# Patient Record
Sex: Female | Born: 1935
Health system: Southern US, Community
[De-identification: ages and names within clinical notes are randomized; demographics above are authoritative.]

## PROBLEM LIST (undated history)

## (undated) DIAGNOSIS — F32A Depression, unspecified: Secondary | ICD-10-CM

## (undated) DIAGNOSIS — K219 Gastro-esophageal reflux disease without esophagitis: Secondary | ICD-10-CM

## (undated) DIAGNOSIS — K8020 Calculus of gallbladder without cholecystitis without obstruction: Secondary | ICD-10-CM

## (undated) DIAGNOSIS — I714 Abdominal aortic aneurysm, without rupture, unspecified: Secondary | ICD-10-CM

## (undated) DIAGNOSIS — G8929 Other chronic pain: Secondary | ICD-10-CM

## (undated) DIAGNOSIS — M545 Low back pain, unspecified: Secondary | ICD-10-CM

## (undated) DIAGNOSIS — E538 Deficiency of other specified B group vitamins: Secondary | ICD-10-CM

## (undated) DIAGNOSIS — I700 Atherosclerosis of aorta: Secondary | ICD-10-CM

## (undated) DIAGNOSIS — M810 Age-related osteoporosis without current pathological fracture: Secondary | ICD-10-CM

## (undated) DIAGNOSIS — I10 Essential (primary) hypertension: Secondary | ICD-10-CM

## (undated) DIAGNOSIS — H409 Unspecified glaucoma: Secondary | ICD-10-CM

## (undated) DIAGNOSIS — E039 Hypothyroidism, unspecified: Secondary | ICD-10-CM

## (undated) DIAGNOSIS — F419 Anxiety disorder, unspecified: Secondary | ICD-10-CM

## (undated) DIAGNOSIS — E7800 Pure hypercholesterolemia, unspecified: Secondary | ICD-10-CM

## (undated) DIAGNOSIS — D369 Benign neoplasm, unspecified site: Secondary | ICD-10-CM

## (undated) DIAGNOSIS — M1990 Unspecified osteoarthritis, unspecified site: Secondary | ICD-10-CM

## (undated) DIAGNOSIS — Z8639 Personal history of other endocrine, nutritional and metabolic disease: Secondary | ICD-10-CM

## (undated) DIAGNOSIS — G43909 Migraine, unspecified, not intractable, without status migrainosus: Secondary | ICD-10-CM

## (undated) DIAGNOSIS — K5790 Diverticulosis of intestine, part unspecified, without perforation or abscess without bleeding: Secondary | ICD-10-CM

## (undated) DIAGNOSIS — F329 Major depressive disorder, single episode, unspecified: Secondary | ICD-10-CM

## (undated) DIAGNOSIS — K921 Melena: Secondary | ICD-10-CM

## (undated) HISTORY — DX: Pure hypercholesterolemia, unspecified: E78.00

## (undated) HISTORY — DX: Age-related osteoporosis without current pathological fracture: M81.0

## (undated) HISTORY — DX: Calculus of gallbladder without cholecystitis without obstruction: K80.20

## (undated) HISTORY — DX: Personal history of other endocrine, nutritional and metabolic disease: Z86.39

## (undated) HISTORY — DX: Diverticulosis of intestine, part unspecified, without perforation or abscess without bleeding: K57.90

## (undated) HISTORY — DX: Unspecified osteoarthritis, unspecified site: M19.90

## (undated) HISTORY — PX: JOINT REPLACEMENT: SHX530

## (undated) HISTORY — DX: Deficiency of other specified B group vitamins: E53.8

## (undated) HISTORY — DX: Benign neoplasm, unspecified site: D36.9

## (undated) HISTORY — DX: Low back pain, unspecified: M54.50

## (undated) HISTORY — DX: Gastro-esophageal reflux disease without esophagitis: K21.9

## (undated) HISTORY — DX: Depression, unspecified: F32.A

## (undated) HISTORY — PX: ABDOMINAL HYSTERECTOMY: SHX81

## (undated) HISTORY — PX: BREAST ENHANCEMENT SURGERY: SHX7

## (undated) HISTORY — DX: Migraine, unspecified, not intractable, without status migrainosus: G43.909

## (undated) HISTORY — DX: Abdominal aortic aneurysm, without rupture, unspecified: I71.40

## (undated) HISTORY — PX: HERNIA REPAIR: SHX51

## (undated) HISTORY — DX: Anxiety disorder, unspecified: F41.9

## (undated) HISTORY — DX: Atherosclerosis of aorta: I70.0

## (undated) HISTORY — DX: Major depressive disorder, single episode, unspecified: F32.9

## (undated) HISTORY — DX: Low back pain: M54.5

## (undated) HISTORY — DX: Abdominal aortic aneurysm, without rupture: I71.4

## (undated) HISTORY — PX: CATARACT EXTRACTION, BILATERAL: SHX1313

## (undated) HISTORY — DX: Other chronic pain: G89.29

---

## 1997-11-06 DIAGNOSIS — K5790 Diverticulosis of intestine, part unspecified, without perforation or abscess without bleeding: Secondary | ICD-10-CM

## 1997-11-06 HISTORY — DX: Diverticulosis of intestine, part unspecified, without perforation or abscess without bleeding: K57.90

## 1998-08-18 ENCOUNTER — Emergency Department (HOSPITAL_COMMUNITY): Admission: EM | Admit: 1998-08-18 | Discharge: 1998-08-18 | Payer: Self-pay | Admitting: Emergency Medicine

## 1998-09-09 ENCOUNTER — Inpatient Hospital Stay (HOSPITAL_COMMUNITY): Admission: EM | Admit: 1998-09-09 | Discharge: 1998-09-11 | Payer: Self-pay | Admitting: Emergency Medicine

## 1999-02-26 ENCOUNTER — Emergency Department (HOSPITAL_COMMUNITY): Admission: EM | Admit: 1999-02-26 | Discharge: 1999-02-26 | Payer: Self-pay | Admitting: Emergency Medicine

## 2000-07-30 ENCOUNTER — Other Ambulatory Visit: Admission: RE | Admit: 2000-07-30 | Discharge: 2000-07-30 | Payer: Self-pay | Admitting: Family Medicine

## 2000-08-06 ENCOUNTER — Encounter: Payer: Self-pay | Admitting: Dermatology

## 2000-08-06 ENCOUNTER — Encounter: Admission: RE | Admit: 2000-08-06 | Discharge: 2000-08-06 | Payer: Self-pay | Admitting: Dermatology

## 2000-11-28 ENCOUNTER — Encounter: Payer: Self-pay | Admitting: Family Medicine

## 2000-11-28 ENCOUNTER — Encounter: Admission: RE | Admit: 2000-11-28 | Discharge: 2000-11-28 | Payer: Self-pay | Admitting: Family Medicine

## 2001-02-19 ENCOUNTER — Ambulatory Visit (HOSPITAL_COMMUNITY): Admission: RE | Admit: 2001-02-19 | Discharge: 2001-02-19 | Payer: Self-pay | Admitting: Gastroenterology

## 2001-05-24 ENCOUNTER — Encounter: Payer: Self-pay | Admitting: Emergency Medicine

## 2001-05-25 ENCOUNTER — Inpatient Hospital Stay (HOSPITAL_COMMUNITY): Admission: RE | Admit: 2001-05-25 | Discharge: 2001-05-26 | Payer: Self-pay | Admitting: Interventional Cardiology

## 2001-05-26 ENCOUNTER — Encounter: Payer: Self-pay | Admitting: Interventional Cardiology

## 2001-07-03 ENCOUNTER — Other Ambulatory Visit: Admission: RE | Admit: 2001-07-03 | Discharge: 2001-07-03 | Payer: Self-pay | Admitting: Family Medicine

## 2004-03-30 ENCOUNTER — Ambulatory Visit (HOSPITAL_COMMUNITY): Admission: RE | Admit: 2004-03-30 | Discharge: 2004-03-30 | Payer: Self-pay | Admitting: Family Medicine

## 2004-03-31 ENCOUNTER — Ambulatory Visit (HOSPITAL_COMMUNITY): Admission: RE | Admit: 2004-03-31 | Discharge: 2004-03-31 | Payer: Self-pay | Admitting: Family Medicine

## 2004-07-05 ENCOUNTER — Ambulatory Visit (HOSPITAL_COMMUNITY): Admission: RE | Admit: 2004-07-05 | Discharge: 2004-07-05 | Payer: Self-pay | Admitting: Family Medicine

## 2004-11-04 ENCOUNTER — Other Ambulatory Visit: Admission: RE | Admit: 2004-11-04 | Discharge: 2004-11-04 | Payer: Self-pay | Admitting: Family Medicine

## 2005-03-20 ENCOUNTER — Encounter (HOSPITAL_COMMUNITY): Admission: RE | Admit: 2005-03-20 | Discharge: 2005-06-18 | Payer: Self-pay | Admitting: Endocrinology

## 2005-07-17 ENCOUNTER — Encounter: Admission: RE | Admit: 2005-07-17 | Discharge: 2005-07-17 | Payer: Self-pay | Admitting: Family Medicine

## 2005-07-31 ENCOUNTER — Encounter: Admission: RE | Admit: 2005-07-31 | Discharge: 2005-09-07 | Payer: Self-pay | Admitting: Neurosurgery

## 2005-11-13 ENCOUNTER — Other Ambulatory Visit: Admission: RE | Admit: 2005-11-13 | Discharge: 2005-11-13 | Payer: Self-pay | Admitting: Family Medicine

## 2006-01-03 ENCOUNTER — Encounter: Admission: RE | Admit: 2006-01-03 | Discharge: 2006-01-03 | Payer: Self-pay | Admitting: General Surgery

## 2006-01-08 ENCOUNTER — Ambulatory Visit (HOSPITAL_BASED_OUTPATIENT_CLINIC_OR_DEPARTMENT_OTHER): Admission: RE | Admit: 2006-01-08 | Discharge: 2006-01-08 | Payer: Self-pay | Admitting: General Surgery

## 2006-01-08 ENCOUNTER — Encounter (INDEPENDENT_AMBULATORY_CARE_PROVIDER_SITE_OTHER): Payer: Self-pay | Admitting: *Deleted

## 2006-07-23 ENCOUNTER — Ambulatory Visit (HOSPITAL_COMMUNITY): Admission: RE | Admit: 2006-07-23 | Discharge: 2006-07-23 | Payer: Self-pay | Admitting: Endocrinology

## 2006-07-31 ENCOUNTER — Encounter (HOSPITAL_COMMUNITY): Admission: RE | Admit: 2006-07-31 | Discharge: 2006-10-29 | Payer: Self-pay | Admitting: Endocrinology

## 2006-11-27 ENCOUNTER — Ambulatory Visit (HOSPITAL_COMMUNITY): Admission: RE | Admit: 2006-11-27 | Discharge: 2006-11-27 | Payer: Self-pay | Admitting: Ophthalmology

## 2006-11-27 ENCOUNTER — Encounter: Payer: Self-pay | Admitting: Vascular Surgery

## 2006-12-09 ENCOUNTER — Emergency Department (HOSPITAL_COMMUNITY): Admission: EM | Admit: 2006-12-09 | Discharge: 2006-12-09 | Payer: Self-pay | Admitting: *Deleted

## 2007-05-23 ENCOUNTER — Ambulatory Visit (HOSPITAL_COMMUNITY): Admission: RE | Admit: 2007-05-23 | Discharge: 2007-05-24 | Payer: Self-pay | Admitting: Ophthalmology

## 2008-04-09 ENCOUNTER — Encounter: Admission: RE | Admit: 2008-04-09 | Discharge: 2008-04-09 | Payer: Self-pay | Admitting: Family Medicine

## 2009-01-20 ENCOUNTER — Encounter: Admission: RE | Admit: 2009-01-20 | Discharge: 2009-01-20 | Payer: Self-pay | Admitting: Internal Medicine

## 2009-03-09 ENCOUNTER — Ambulatory Visit (HOSPITAL_COMMUNITY): Admission: RE | Admit: 2009-03-09 | Discharge: 2009-03-09 | Payer: Self-pay | Admitting: Cardiology

## 2010-01-10 ENCOUNTER — Other Ambulatory Visit: Admission: RE | Admit: 2010-01-10 | Discharge: 2010-01-10 | Payer: Self-pay | Admitting: Family Medicine

## 2010-02-23 ENCOUNTER — Inpatient Hospital Stay (HOSPITAL_COMMUNITY): Admission: RE | Admit: 2010-02-23 | Discharge: 2010-02-27 | Payer: Self-pay | Admitting: Orthopedic Surgery

## 2010-11-27 ENCOUNTER — Encounter: Payer: Self-pay | Admitting: Family Medicine

## 2011-01-24 LAB — PROTIME-INR
INR: 1.18 (ref 0.00–1.49)
INR: 1.39 (ref 0.00–1.49)
INR: 1.6 — ABNORMAL HIGH (ref 0.00–1.49)
Prothrombin Time: 16.9 seconds — ABNORMAL HIGH (ref 11.6–15.2)
Prothrombin Time: 18.9 seconds — ABNORMAL HIGH (ref 11.6–15.2)
Prothrombin Time: 19.4 seconds — ABNORMAL HIGH (ref 11.6–15.2)

## 2011-01-24 LAB — TYPE AND SCREEN: Antibody Screen: NEGATIVE

## 2011-01-24 LAB — PREPARE RBC (CROSSMATCH)

## 2011-01-24 LAB — CBC
MCHC: 33.4 g/dL (ref 30.0–36.0)
MCHC: 33.5 g/dL (ref 30.0–36.0)
MCHC: 33.7 g/dL (ref 30.0–36.0)
MCV: 94.3 fL (ref 78.0–100.0)
MCV: 94.5 fL (ref 78.0–100.0)
Platelets: 134 10*3/uL — ABNORMAL LOW (ref 150–400)
RBC: 2.92 MIL/uL — ABNORMAL LOW (ref 3.87–5.11)
RDW: 13.1 % (ref 11.5–15.5)
RDW: 13.7 % (ref 11.5–15.5)
WBC: 7.5 10*3/uL (ref 4.0–10.5)

## 2011-01-24 LAB — BASIC METABOLIC PANEL
BUN: 26 mg/dL — ABNORMAL HIGH (ref 6–23)
CO2: 28 mEq/L (ref 19–32)
CO2: 29 mEq/L (ref 19–32)
Chloride: 102 mEq/L (ref 96–112)
Creatinine, Ser: 0.97 mg/dL (ref 0.4–1.2)
GFR calc Af Amer: 59 mL/min — ABNORMAL LOW (ref >60)
GFR calc non Af Amer: 49 mL/min — ABNORMAL LOW (ref >60)
Glucose, Bld: 124 mg/dL — ABNORMAL HIGH (ref 70–99)
Potassium: 3.9 mEq/L (ref 3.5–5.1)

## 2011-01-25 LAB — CBC
HCT: 37.2 % (ref 36.0–46.0)
MCHC: 34.1 g/dL (ref 30.0–36.0)
MCV: 94 fL (ref 78.0–100.0)
Platelets: 220 10*3/uL (ref 150–400)
RDW: 13.4 % (ref 11.5–15.5)

## 2011-01-25 LAB — URINALYSIS, ROUTINE W REFLEX MICROSCOPIC
Glucose, UA: NEGATIVE mg/dL
Hgb urine dipstick: NEGATIVE
Ketones, ur: NEGATIVE mg/dL
Protein, ur: NEGATIVE mg/dL

## 2011-01-25 LAB — COMPREHENSIVE METABOLIC PANEL
Albumin: 4.1 g/dL (ref 3.5–5.2)
BUN: 23 mg/dL (ref 6–23)
Calcium: 9.5 mg/dL (ref 8.4–10.5)
Chloride: 102 mEq/L (ref 96–112)
Creatinine, Ser: 0.87 mg/dL (ref 0.4–1.2)
Total Bilirubin: 0.7 mg/dL (ref 0.3–1.2)
Total Protein: 7.4 g/dL (ref 6.0–8.3)

## 2011-01-25 LAB — PROTIME-INR: INR: 1.07 (ref 0.00–1.49)

## 2011-01-25 LAB — APTT: aPTT: 33 seconds (ref 24–37)

## 2011-03-21 NOTE — Op Note (Signed)
NAMEGAYNA, BRADDY                 ACCOUNT NO.:  [id number]   MEDICAL RECORD NO.:  [id number]          PATIENT TYPE:  OIB   LOCATION:  5727                         FACILITY:  [facility]   PHYSICIAN:  John D. Ashley Royalty, M.D. DATE OF BIRTH:  [birth year]   DATE OF PROCEDURE:  05/23/2007  DATE OF DISCHARGE:  05/24/2007                               OPERATIVE REPORT   ADMISSION DIAGNOSIS:  Retained lens material, glaucoma, posterior  membranes, chorioretinitis in the right eye.   PROCEDURES:  Pars plana vitrectomy with removal of lens remnants and  nucleus from the vitreous cavity with fragmentation, membrane peel,  retinal photocoagulation, all in the right eye.   SURGEON:  Alan Mulder, MD   ASSISTANT:  Rosalie Doctor, MA   ANESTHESIA:  General.   DETAILS:  Usual prep and drape, 20 gauge sclerotomies at 8, 10 and 2  o'clock.  5 mm infusion port anchored in place at 5 o'clock.  The  lighted pick and the cutter placed at 10 and 2 o'clock respectively.  Pars plana vitrectomy was begun in the pupillary axis where white fluffy  material was encountered.  This was carefully removed under low suction  and rapid cutting.  Yellow nuclear fragments and white fluffy fragments  were seen.  The lens was ensnared in vitreous for 360 degrees.  The  vitrectomy cutter was used to free the vitreous from the lens fragments  and remove small particles and medium sized particle from their  attachments to the vitreous and from their free floating position in the  vitreous.  Once the large nuclear fragments became mobile, the  fragmentome was placed in the eye and the phacofragmentation was  performed by engaging the nucleus, lifting it up to the pupillary axis  and then turning on the fragmentation device.  This was performed  multiple times until all yellow fragments were removed.  The fragmentome  was removed from the eye.  The cutter was repositioned in the eye.  Pars  plana vitrectomy was continued.  The  posterior hyaloid lifted from its  attachments to the macula and the lighted pick was used to engage  posterior membranes which were attached to the disk and to chorioretinal  scars.  The retinal break was seen at 8 o'clock.  This was immediately  surrounded with laser.  Another retinal break was seen at 10 o'clock and  this was surrounded with laser.  Once all vitreous was removed, the  attention was carried to the pars plana area where additional white  material was seen.  This was carefully removed under low suction and  rapid cutting with the vitreous cutter.  The laser was positioned in the  eye.  The endolaser was positioned in the eye.  A total of 1270 burns  were placed around retinal breaks and around the periphery.  The power  was 1000 milliwatts, 1000 microns each and 0.1 seconds each.  Once all  fragments were removed, the instruments removed from the eye and 9-0  nylon was used to close the sclerotomy sites.  The conjunctiva was  closed with wet-field  cautery.  Polymyxin and gentamicin were irrigated  into [place] space.  Marcaine was injected around the globe for postop  pain.  TobraDex ophthalmic  ointment, a patch and shield were placed, Decadron 10 mg was injected to  the lower subconjunctival space.  Closing pressure was 15 with a  Barraquer tonometer.  Complications none.  Duration 1 hour.  The patient  was awakened and taken to recovery in satisfactory condition.      Beulah Gandy. Ashley Royalty, M.D.  Electronically Signed     JDM/MEDQ  D:  05/23/2007  T:  05/24/2007  Job:  [id number]

## 2011-03-24 NOTE — Op Note (Signed)
[name], Margaret                 ACCOUNT NO.:  [id number]   MEDICAL RECORD NO.:  [id number]          PATIENT TYPE:  AMB   LOCATION:  DSC                          FACILITY:  [facility]   PHYSICIAN:  Anselm Pancoast. Weatherly, M.D.DATE OF BIRTH:  05/08/1929   DATE OF PROCEDURE:  01/08/2006  DATE OF DISCHARGE:                                 OPERATIVE REPORT   PREOPERATIVE DIAGNOSIS:  Right inguinal hernia, probably an indirect hernia.   POSTOPERATIVE DIAGNOSIS:   OPERATION PERFORMED:  Right inguinal herniorrhaphy.   SURGEON:  Anselm Pancoast. Zachery Dakins, M.D.   ASSISTANT:  Nurse.   ANESTHESIA:  General.  Local supplementation.   INDICATIONS FOR PROCEDURE:  Margaret Li is a 77 year old female who was seen  by Dr. Lurene Li probably about a year ago with left lower quadrant pain.  At  that time on his exam he could not feel an inguinal hernia.  She had  gallstones that they thought were asymptomatic and there was some question  of whether Dr. Lurene Li had recommended surgery for her gallstones, but that  according to Dr. Danella Penton notes, was never recommended.  Anyway, the patient  now has a bulge in the right groin area that her primary physician referred  her to me thinking possibly this was a femoral hernia.  In examination in  the office approximately two weeks ago, I felt that this was probably not a  femoral hernia, but it was definitely an inguinal hernia with a definite  bulge coming out through the external ring area and scheduled, recommended  that we proceed on with a right inguinal herniorrhaphy.  She has had a past  history of blood pressure control and anxiety and I think, however, we could  do her as an outpatient.  Margaret Li, I think, is her primary care  physician.   DESCRIPTION OF PROCEDURE:  The patient was taken to the operative suite.  The patient then identified.  I had marked the right side and I made after  she was introduced with anesthesia and LOE tube placed, the right  groin was  prepped with Betadine solution and draped in sterile manner.  The inguinal  area had been marked and we made an incision down through the superficial  subcutaneous tissue through Scarpa's fascia and then identified the external  oblique aponeurosis.  There is definitely fatty tissue protruding out  through the external ring that had been stretched.  I could not feel any  bulge in the actual femoral area and I opened the external oblique open and  then first reduced this preperitoneal fatty tissue that had herniated  through the ring.  I dissected the fatty tissue from the ring structure very  carefully.  The inguinal nerve had been identified and there was a lot of  preperitoneal fatty tissue that had herniated down but the hernia sac was  separated from this, opened and then a high sac ligation with 2-0 Surgilon  performed and the hernia sac removed.  A second suture was placed.  The  actual floor area that had been definitely stretched and weakened with  this  hernia was then closed with a running 2-0 Prolene starting at the symphysis  pubis and coming down recreating the internal ring and then going back tying  the two ends together.  The external oblique I actually closed it also with  a 2-0 Prolene so that I can incorporate the internal oblique and the  external oblique into a single layer since there coursed no cord structures.  The external ring that had been stretched because of the wad of stuff  herniating through it was tightened and the inspection from the inside, I  could not actually feel it or see a direct inguinal hernia.  The  subcutaneous tissue was closed with 3-0 Vicryl, 4-0 Dexon subcuticular and  then benzoin and  Steri-Strips on the patient's skin.  I had used a nerve block of 0.25%  Marcaine with Adrenalin at the ilioinguinal nerve area and also the floor  area which hopefully will prevent immediate postoperative pain.  The patient  will be released after a  short stay and see him back in the office in  approximately 10 days.           ______________________________  Anselm Pancoast. Zachery Dakins, M.D.     WJW/MEDQ  D:  01/08/2006  T:  01/09/2006  Job:  [id number]   cc:   Duncan Dull, M.D.  Fax: [phone]

## 2011-03-24 NOTE — Consult Note (Signed)
[facility]  Patient:    Margaret Li, Margaret Li                        MRN: [id number] Proc. Date: 05/24/01 Adm. Date:  [id number] Disc. Date: [id number] Attending:  Daine Gip CC:         Duncan Dull, M.D.   Consultation Report  PRIMARY PHYSICIAN: Duncan Dull, M.D.  REASON FOR CONSULTATION: Chest discomfort.  HISTORY OF PRESENT ILLNESS: This patient is 77 years of age and came to the emergency room because of dizziness and chest discomfort.  She states dizziness has been going on for several months now.  She has had scans done of her brain by Dr. Kevan Ny but no identity/cause for the dizziness could be found. Over the past two weeks she has been experiencing chest pressure.  She poorly characterizes this discomfort.  She did state, though, that even after arriving here this evening she was having pressure on her chest that was eventually relieved when IV nitroglycerin was started.  The discomfort is not exertion related.  There is no associated dyspnea or radiation.  The dizziness and the chest discomfort also do not seem to be related.  ALLERGIES:  1. CODEINE.  2. SULFA.  PAST MEDICAL HISTORY:  1. History of lower GI bleed secondary to diverticulosis.  2. Hypertension.  3. Glaucoma.  4. Depression.  5. Tonsillectomy.  6. Total abdominal hysterectomy and bilateral salpingo-oophorectomy.  7. Left inguinal herniorrhaphy.  SOCIAL HISTORY: Smokes less than one pack per day.  Negative ethanol consumption.  She is a retired Engineer, site.  She is divorced.  FAMILY HISTORY: Mother died at 81.  The patient believes she died of a cardiac cause.  REVIEW OF SYSTEMS: Dr. Jennelle Human is her psychiatrist.  PHYSICAL EXAMINATION:  VITAL SIGNS: Blood pressure 148/70, heart rate 70, respirations 16 and unlabored.  SKIN: Clear.  Nail beds reveal no cyanosis.  No clubbing noted.  HEENT: PERRL.  EOMI.  NECK: No JVD or carotid bruits.  LUNGS:  Clear to auscultation and percussion.  CARDIAC: No clicks, no rubs or murmurs.  An S4 gallop is audible.  ABDOMEN: Soft.  Liver and spleen not palpable.  Bowel sounds normal.  EXTREMITIES: No edema.  Pulses 2+ and symmetric in upper and lower extremities.  NEUROLOGIC: The patient is alert and oriented x 3.  No motor or sensory deficits noted.  LABORATORY DATA: EKG reveals nonspecific T wave abnormality, normal sinus rhythm.  Chest x-ray reveals no acute disease.  Laboratory data pending.  Potassium 3.7.  CK-MB and troponin I pending.  ASSESSMENT:  1. Chest pressure, qualitatively consistent with ischemic pain.  Rule out     myocardial infarction.  2. History of depression, taken care of by Dr. Jennelle Human.  3. Dizziness, chronic, etiology unidentified at this point.  4. Hypertension.  PLAN:  1. Admit to rule out myocardial infarction.  2. IV nitroglycerin.  3. IV heparin.  4. Consider adenosine Cardiolite if she rules out. DD:  05/24/01 TD:  05/26/01 Job: [id number]

## 2011-03-24 NOTE — Discharge Summary (Signed)
[facility]  Patient:    Margaret Li, Margaret Li                        MRN: [id number] Adm. Date:  [id number] Disc. Date: [id number] Attending:  Lyn Records. Iii CC:         Duncan Dull, M.D.   Discharge Summary  ADMITTING DIAGNOSIS:  Chest discomfort with features consistent with angina pectoris.  DISCHARGE DIAGNOSES: 1. Chest pain, myocardial infarction ruled out and no cardiac source    identified. 2. Hypertension, controlled.  TESTS PERFORMED:  Adenosine Cardiolite, [facility].  DISCHARGE INSTRUCTIONS: 1. Continue usual home medications including nortriptyline 15 mg p.o. q.h.s. 2. Prozac 20 mg p.o. q.h.s. 3. Lorazepam 0.5 mg at bedtime. 4. Avapro 150 mg a day. 5. Estraderm patch 0.1 mg twice weekly. 6. HCTZ one-half tablet per day. 7. Multivitamins one per day.  ACTIVITY:  No restrictions.  DIET:  Low salt.  CONDITION ON DISCHARGE:  Improved.  HOSPITAL COURSE:  The patient is a very pleasant 77 year old African-American female who was admitted through the [facility] emergency room on May 24, 2001.  She was admitted with a somewhat vague history of chest pressure. Initial EKG revealed nonspecific ST-T abnormality and initial cardiac markers for a myocardial injury were negative.  Because of the history, the patients age, and the quality of the discomfort, she was to be admitted to [facility] to rule out myocardial infarction.  Unfortunately, beds were not present at [facility] and the patient was transported to [facility] in the early morning hours of May 25, 2001.  This actually occurred to the patients displeasure and without my knowledge.  The patient remained stable while hospitalized at [facility].  The patients chest discomfort was relieved with intravenous nitroglycerin and was eventually weaned and discontinued.  Serial enzymes did not reveal evidence of myocardial  infarction.  On May 26, 2001, she was transported back to [facility] by ambulance and adenosine Cardiolite was performed.  This study was reviewed this afternoon with Dr. Loralie Champagne and we felt that the study was entirely normal.  Patient is discharged at this time.  A Cardiolite study and serial enzymes have not demonstrated any evidence of ischemic heart disease.  She has a follow-up with her primary doctor, Dr. Shaune Pollack, in the coming week or two. She is already scheduled to have a complete examination. DD:  05/26/01 TD:  05/27/01 Job: [id number]

## 2011-08-21 LAB — CBC
HCT: 35.4 — ABNORMAL LOW
Platelets: 244
WBC: 6.3

## 2011-08-21 LAB — BASIC METABOLIC PANEL
BUN: 26 — ABNORMAL HIGH
GFR calc non Af Amer: 54 — ABNORMAL LOW
Potassium: 4.1

## 2011-11-30 DIAGNOSIS — H401490 Capsular glaucoma with pseudoexfoliation of lens, unspecified eye, stage unspecified: Secondary | ICD-10-CM | POA: Diagnosis not present

## 2011-11-30 DIAGNOSIS — H409 Unspecified glaucoma: Secondary | ICD-10-CM | POA: Diagnosis not present

## 2011-11-30 DIAGNOSIS — H04129 Dry eye syndrome of unspecified lacrimal gland: Secondary | ICD-10-CM | POA: Diagnosis not present

## 2011-11-30 DIAGNOSIS — H1045 Other chronic allergic conjunctivitis: Secondary | ICD-10-CM | POA: Diagnosis not present

## 2012-01-04 DIAGNOSIS — F329 Major depressive disorder, single episode, unspecified: Secondary | ICD-10-CM | POA: Diagnosis not present

## 2012-01-04 DIAGNOSIS — F3289 Other specified depressive episodes: Secondary | ICD-10-CM | POA: Diagnosis not present

## 2012-02-08 DIAGNOSIS — E039 Hypothyroidism, unspecified: Secondary | ICD-10-CM | POA: Diagnosis not present

## 2012-02-08 DIAGNOSIS — F329 Major depressive disorder, single episode, unspecified: Secondary | ICD-10-CM | POA: Diagnosis not present

## 2012-02-08 DIAGNOSIS — F3289 Other specified depressive episodes: Secondary | ICD-10-CM | POA: Diagnosis not present

## 2012-02-08 DIAGNOSIS — E7800 Pure hypercholesterolemia, unspecified: Secondary | ICD-10-CM | POA: Diagnosis not present

## 2012-02-08 DIAGNOSIS — Z79899 Other long term (current) drug therapy: Secondary | ICD-10-CM | POA: Diagnosis not present

## 2012-02-08 DIAGNOSIS — I10 Essential (primary) hypertension: Secondary | ICD-10-CM | POA: Diagnosis not present

## 2012-03-06 DIAGNOSIS — H01009 Unspecified blepharitis unspecified eye, unspecified eyelid: Secondary | ICD-10-CM | POA: Diagnosis not present

## 2012-03-06 DIAGNOSIS — H04129 Dry eye syndrome of unspecified lacrimal gland: Secondary | ICD-10-CM | POA: Diagnosis not present

## 2012-03-06 DIAGNOSIS — H401490 Capsular glaucoma with pseudoexfoliation of lens, unspecified eye, stage unspecified: Secondary | ICD-10-CM | POA: Diagnosis not present

## 2012-03-06 DIAGNOSIS — H409 Unspecified glaucoma: Secondary | ICD-10-CM | POA: Diagnosis not present

## 2012-03-11 DIAGNOSIS — J40 Bronchitis, not specified as acute or chronic: Secondary | ICD-10-CM | POA: Diagnosis not present

## 2012-03-11 DIAGNOSIS — R0989 Other specified symptoms and signs involving the circulatory and respiratory systems: Secondary | ICD-10-CM | POA: Diagnosis not present

## 2012-03-11 DIAGNOSIS — R062 Wheezing: Secondary | ICD-10-CM | POA: Diagnosis not present

## 2012-04-10 DIAGNOSIS — I951 Orthostatic hypotension: Secondary | ICD-10-CM | POA: Diagnosis not present

## 2012-04-17 DIAGNOSIS — H35379 Puckering of macula, unspecified eye: Secondary | ICD-10-CM | POA: Diagnosis not present

## 2012-04-17 DIAGNOSIS — H4011X0 Primary open-angle glaucoma, stage unspecified: Secondary | ICD-10-CM | POA: Diagnosis not present

## 2012-04-17 DIAGNOSIS — Z961 Presence of intraocular lens: Secondary | ICD-10-CM | POA: Diagnosis not present

## 2012-04-17 DIAGNOSIS — H26499 Other secondary cataract, unspecified eye: Secondary | ICD-10-CM | POA: Diagnosis not present

## 2012-04-17 DIAGNOSIS — H40159 Residual stage of open-angle glaucoma, unspecified eye: Secondary | ICD-10-CM | POA: Diagnosis not present

## 2012-09-11 DIAGNOSIS — Z0000 Encounter for general adult medical examination without abnormal findings: Secondary | ICD-10-CM | POA: Diagnosis not present

## 2012-09-11 DIAGNOSIS — F329 Major depressive disorder, single episode, unspecified: Secondary | ICD-10-CM | POA: Diagnosis not present

## 2012-09-11 DIAGNOSIS — H409 Unspecified glaucoma: Secondary | ICD-10-CM | POA: Diagnosis not present

## 2012-09-11 DIAGNOSIS — M810 Age-related osteoporosis without current pathological fracture: Secondary | ICD-10-CM | POA: Diagnosis not present

## 2012-09-11 DIAGNOSIS — I10 Essential (primary) hypertension: Secondary | ICD-10-CM | POA: Diagnosis not present

## 2012-09-11 DIAGNOSIS — E7800 Pure hypercholesterolemia, unspecified: Secondary | ICD-10-CM | POA: Diagnosis not present

## 2012-09-11 DIAGNOSIS — Z79899 Other long term (current) drug therapy: Secondary | ICD-10-CM | POA: Diagnosis not present

## 2012-09-11 DIAGNOSIS — E039 Hypothyroidism, unspecified: Secondary | ICD-10-CM | POA: Diagnosis not present

## 2012-09-11 DIAGNOSIS — Z23 Encounter for immunization: Secondary | ICD-10-CM | POA: Diagnosis not present

## 2012-09-23 DIAGNOSIS — M810 Age-related osteoporosis without current pathological fracture: Secondary | ICD-10-CM | POA: Diagnosis not present

## 2012-11-20 DIAGNOSIS — Z1231 Encounter for screening mammogram for malignant neoplasm of breast: Secondary | ICD-10-CM | POA: Diagnosis not present

## 2012-12-09 DIAGNOSIS — H01009 Unspecified blepharitis unspecified eye, unspecified eyelid: Secondary | ICD-10-CM | POA: Diagnosis not present

## 2012-12-09 DIAGNOSIS — H26499 Other secondary cataract, unspecified eye: Secondary | ICD-10-CM | POA: Diagnosis not present

## 2012-12-09 DIAGNOSIS — H40159 Residual stage of open-angle glaucoma, unspecified eye: Secondary | ICD-10-CM | POA: Diagnosis not present

## 2012-12-09 DIAGNOSIS — H401490 Capsular glaucoma with pseudoexfoliation of lens, unspecified eye, stage unspecified: Secondary | ICD-10-CM | POA: Diagnosis not present

## 2012-12-12 DIAGNOSIS — I10 Essential (primary) hypertension: Secondary | ICD-10-CM | POA: Diagnosis not present

## 2012-12-13 DIAGNOSIS — M169 Osteoarthritis of hip, unspecified: Secondary | ICD-10-CM | POA: Diagnosis not present

## 2012-12-13 DIAGNOSIS — R079 Chest pain, unspecified: Secondary | ICD-10-CM | POA: Diagnosis not present

## 2012-12-13 DIAGNOSIS — M1610 Unilateral primary osteoarthritis, unspecified hip: Secondary | ICD-10-CM | POA: Diagnosis not present

## 2013-01-09 DIAGNOSIS — I10 Essential (primary) hypertension: Secondary | ICD-10-CM | POA: Diagnosis not present

## 2013-01-09 DIAGNOSIS — F329 Major depressive disorder, single episode, unspecified: Secondary | ICD-10-CM | POA: Diagnosis not present

## 2013-01-09 DIAGNOSIS — R5381 Other malaise: Secondary | ICD-10-CM | POA: Diagnosis not present

## 2013-02-06 DIAGNOSIS — I10 Essential (primary) hypertension: Secondary | ICD-10-CM | POA: Diagnosis not present

## 2013-02-07 DIAGNOSIS — H409 Unspecified glaucoma: Secondary | ICD-10-CM | POA: Diagnosis not present

## 2013-02-07 DIAGNOSIS — H401490 Capsular glaucoma with pseudoexfoliation of lens, unspecified eye, stage unspecified: Secondary | ICD-10-CM | POA: Diagnosis not present

## 2013-02-07 DIAGNOSIS — H04129 Dry eye syndrome of unspecified lacrimal gland: Secondary | ICD-10-CM | POA: Diagnosis not present

## 2013-04-09 DIAGNOSIS — H401490 Capsular glaucoma with pseudoexfoliation of lens, unspecified eye, stage unspecified: Secondary | ICD-10-CM | POA: Diagnosis not present

## 2013-04-09 DIAGNOSIS — H01009 Unspecified blepharitis unspecified eye, unspecified eyelid: Secondary | ICD-10-CM | POA: Diagnosis not present

## 2013-04-09 DIAGNOSIS — H04129 Dry eye syndrome of unspecified lacrimal gland: Secondary | ICD-10-CM | POA: Diagnosis not present

## 2013-04-09 DIAGNOSIS — H409 Unspecified glaucoma: Secondary | ICD-10-CM | POA: Diagnosis not present

## 2013-06-11 DIAGNOSIS — F3289 Other specified depressive episodes: Secondary | ICD-10-CM | POA: Diagnosis not present

## 2013-06-11 DIAGNOSIS — R5383 Other fatigue: Secondary | ICD-10-CM | POA: Diagnosis not present

## 2013-06-11 DIAGNOSIS — Z1331 Encounter for screening for depression: Secondary | ICD-10-CM | POA: Diagnosis not present

## 2013-06-11 DIAGNOSIS — F329 Major depressive disorder, single episode, unspecified: Secondary | ICD-10-CM | POA: Diagnosis not present

## 2013-06-11 DIAGNOSIS — R5381 Other malaise: Secondary | ICD-10-CM | POA: Diagnosis not present

## 2013-06-11 DIAGNOSIS — E039 Hypothyroidism, unspecified: Secondary | ICD-10-CM | POA: Diagnosis not present

## 2013-06-11 DIAGNOSIS — I10 Essential (primary) hypertension: Secondary | ICD-10-CM | POA: Diagnosis not present

## 2013-06-13 DIAGNOSIS — H26499 Other secondary cataract, unspecified eye: Secondary | ICD-10-CM | POA: Diagnosis not present

## 2013-06-13 DIAGNOSIS — H35379 Puckering of macula, unspecified eye: Secondary | ICD-10-CM | POA: Diagnosis not present

## 2013-06-13 DIAGNOSIS — H409 Unspecified glaucoma: Secondary | ICD-10-CM | POA: Diagnosis not present

## 2013-06-13 DIAGNOSIS — H35039 Hypertensive retinopathy, unspecified eye: Secondary | ICD-10-CM | POA: Diagnosis not present

## 2013-06-13 DIAGNOSIS — H401490 Capsular glaucoma with pseudoexfoliation of lens, unspecified eye, stage unspecified: Secondary | ICD-10-CM | POA: Diagnosis not present

## 2013-06-13 DIAGNOSIS — Z961 Presence of intraocular lens: Secondary | ICD-10-CM | POA: Diagnosis not present

## 2013-06-13 DIAGNOSIS — H04129 Dry eye syndrome of unspecified lacrimal gland: Secondary | ICD-10-CM | POA: Diagnosis not present

## 2013-06-13 DIAGNOSIS — H01009 Unspecified blepharitis unspecified eye, unspecified eyelid: Secondary | ICD-10-CM | POA: Diagnosis not present

## 2013-07-09 DIAGNOSIS — E039 Hypothyroidism, unspecified: Secondary | ICD-10-CM | POA: Diagnosis not present

## 2013-07-09 DIAGNOSIS — L989 Disorder of the skin and subcutaneous tissue, unspecified: Secondary | ICD-10-CM | POA: Diagnosis not present

## 2013-07-09 DIAGNOSIS — D649 Anemia, unspecified: Secondary | ICD-10-CM | POA: Diagnosis not present

## 2013-07-09 DIAGNOSIS — E7800 Pure hypercholesterolemia, unspecified: Secondary | ICD-10-CM | POA: Diagnosis not present

## 2013-07-09 DIAGNOSIS — F329 Major depressive disorder, single episode, unspecified: Secondary | ICD-10-CM | POA: Diagnosis not present

## 2013-08-01 DIAGNOSIS — Z23 Encounter for immunization: Secondary | ICD-10-CM | POA: Diagnosis not present

## 2013-08-07 DIAGNOSIS — H02409 Unspecified ptosis of unspecified eyelid: Secondary | ICD-10-CM | POA: Diagnosis not present

## 2013-08-07 DIAGNOSIS — H401490 Capsular glaucoma with pseudoexfoliation of lens, unspecified eye, stage unspecified: Secondary | ICD-10-CM | POA: Diagnosis not present

## 2013-08-07 DIAGNOSIS — H409 Unspecified glaucoma: Secondary | ICD-10-CM | POA: Diagnosis not present

## 2013-08-07 DIAGNOSIS — H01009 Unspecified blepharitis unspecified eye, unspecified eyelid: Secondary | ICD-10-CM | POA: Diagnosis not present

## 2013-09-15 DIAGNOSIS — H409 Unspecified glaucoma: Secondary | ICD-10-CM | POA: Diagnosis not present

## 2013-09-15 DIAGNOSIS — H04129 Dry eye syndrome of unspecified lacrimal gland: Secondary | ICD-10-CM | POA: Diagnosis not present

## 2013-09-15 DIAGNOSIS — H01009 Unspecified blepharitis unspecified eye, unspecified eyelid: Secondary | ICD-10-CM | POA: Diagnosis not present

## 2013-09-15 DIAGNOSIS — H401490 Capsular glaucoma with pseudoexfoliation of lens, unspecified eye, stage unspecified: Secondary | ICD-10-CM | POA: Diagnosis not present

## 2013-09-18 DIAGNOSIS — F411 Generalized anxiety disorder: Secondary | ICD-10-CM | POA: Diagnosis not present

## 2013-09-18 DIAGNOSIS — I10 Essential (primary) hypertension: Secondary | ICD-10-CM | POA: Diagnosis not present

## 2013-09-18 DIAGNOSIS — N63 Unspecified lump in unspecified breast: Secondary | ICD-10-CM | POA: Diagnosis not present

## 2013-09-22 DIAGNOSIS — N63 Unspecified lump in unspecified breast: Secondary | ICD-10-CM | POA: Diagnosis not present

## 2013-10-08 DIAGNOSIS — I10 Essential (primary) hypertension: Secondary | ICD-10-CM | POA: Diagnosis not present

## 2013-10-08 DIAGNOSIS — E039 Hypothyroidism, unspecified: Secondary | ICD-10-CM | POA: Diagnosis not present

## 2013-10-08 DIAGNOSIS — E7800 Pure hypercholesterolemia, unspecified: Secondary | ICD-10-CM | POA: Diagnosis not present

## 2013-10-13 DIAGNOSIS — H401490 Capsular glaucoma with pseudoexfoliation of lens, unspecified eye, stage unspecified: Secondary | ICD-10-CM | POA: Diagnosis not present

## 2013-10-13 DIAGNOSIS — H409 Unspecified glaucoma: Secondary | ICD-10-CM | POA: Diagnosis not present

## 2013-12-01 DIAGNOSIS — H04129 Dry eye syndrome of unspecified lacrimal gland: Secondary | ICD-10-CM | POA: Diagnosis not present

## 2013-12-01 DIAGNOSIS — H401490 Capsular glaucoma with pseudoexfoliation of lens, unspecified eye, stage unspecified: Secondary | ICD-10-CM | POA: Diagnosis not present

## 2013-12-01 DIAGNOSIS — H409 Unspecified glaucoma: Secondary | ICD-10-CM | POA: Diagnosis not present

## 2014-01-07 DIAGNOSIS — I10 Essential (primary) hypertension: Secondary | ICD-10-CM | POA: Diagnosis not present

## 2014-02-12 DIAGNOSIS — I10 Essential (primary) hypertension: Secondary | ICD-10-CM | POA: Diagnosis not present

## 2014-02-13 DIAGNOSIS — H409 Unspecified glaucoma: Secondary | ICD-10-CM | POA: Diagnosis not present

## 2014-02-13 DIAGNOSIS — H40059 Ocular hypertension, unspecified eye: Secondary | ICD-10-CM | POA: Diagnosis not present

## 2014-02-13 DIAGNOSIS — H401490 Capsular glaucoma with pseudoexfoliation of lens, unspecified eye, stage unspecified: Secondary | ICD-10-CM | POA: Diagnosis not present

## 2014-03-13 DIAGNOSIS — Z961 Presence of intraocular lens: Secondary | ICD-10-CM | POA: Diagnosis not present

## 2014-03-13 DIAGNOSIS — H4011X0 Primary open-angle glaucoma, stage unspecified: Secondary | ICD-10-CM | POA: Diagnosis not present

## 2014-03-13 DIAGNOSIS — H401490 Capsular glaucoma with pseudoexfoliation of lens, unspecified eye, stage unspecified: Secondary | ICD-10-CM | POA: Diagnosis not present

## 2014-04-13 DIAGNOSIS — I10 Essential (primary) hypertension: Secondary | ICD-10-CM | POA: Diagnosis not present

## 2014-04-13 DIAGNOSIS — G4700 Insomnia, unspecified: Secondary | ICD-10-CM | POA: Diagnosis not present

## 2014-04-13 DIAGNOSIS — E039 Hypothyroidism, unspecified: Secondary | ICD-10-CM | POA: Diagnosis not present

## 2014-04-17 DIAGNOSIS — N750 Cyst of Bartholin's gland: Secondary | ICD-10-CM | POA: Diagnosis not present

## 2014-07-15 DIAGNOSIS — E039 Hypothyroidism, unspecified: Secondary | ICD-10-CM | POA: Diagnosis not present

## 2014-07-15 DIAGNOSIS — I10 Essential (primary) hypertension: Secondary | ICD-10-CM | POA: Diagnosis not present

## 2014-07-15 DIAGNOSIS — F329 Major depressive disorder, single episode, unspecified: Secondary | ICD-10-CM | POA: Diagnosis not present

## 2014-08-10 DIAGNOSIS — H401430 Capsular glaucoma with pseudoexfoliation of lens, bilateral, stage unspecified: Secondary | ICD-10-CM | POA: Diagnosis not present

## 2014-08-10 DIAGNOSIS — H4011X3 Primary open-angle glaucoma, severe stage: Secondary | ICD-10-CM | POA: Diagnosis not present

## 2014-08-10 DIAGNOSIS — H409 Unspecified glaucoma: Secondary | ICD-10-CM | POA: Diagnosis not present

## 2014-09-09 DIAGNOSIS — Z23 Encounter for immunization: Secondary | ICD-10-CM | POA: Diagnosis not present

## 2014-09-09 DIAGNOSIS — L989 Disorder of the skin and subcutaneous tissue, unspecified: Secondary | ICD-10-CM | POA: Diagnosis not present

## 2014-09-09 DIAGNOSIS — M810 Age-related osteoporosis without current pathological fracture: Secondary | ICD-10-CM | POA: Diagnosis not present

## 2014-09-09 DIAGNOSIS — I10 Essential (primary) hypertension: Secondary | ICD-10-CM | POA: Diagnosis not present

## 2014-09-09 DIAGNOSIS — F329 Major depressive disorder, single episode, unspecified: Secondary | ICD-10-CM | POA: Diagnosis not present

## 2014-09-09 DIAGNOSIS — F411 Generalized anxiety disorder: Secondary | ICD-10-CM | POA: Diagnosis not present

## 2014-09-24 DIAGNOSIS — Z1231 Encounter for screening mammogram for malignant neoplasm of breast: Secondary | ICD-10-CM | POA: Diagnosis not present

## 2014-09-24 DIAGNOSIS — Z803 Family history of malignant neoplasm of breast: Secondary | ICD-10-CM | POA: Diagnosis not present

## 2014-09-24 DIAGNOSIS — M810 Age-related osteoporosis without current pathological fracture: Secondary | ICD-10-CM | POA: Diagnosis not present

## 2014-10-12 DIAGNOSIS — D485 Neoplasm of uncertain behavior of skin: Secondary | ICD-10-CM | POA: Diagnosis not present

## 2014-11-11 DIAGNOSIS — H4011X3 Primary open-angle glaucoma, severe stage: Secondary | ICD-10-CM | POA: Diagnosis not present

## 2014-12-14 DIAGNOSIS — I10 Essential (primary) hypertension: Secondary | ICD-10-CM | POA: Diagnosis not present

## 2014-12-14 DIAGNOSIS — E780 Pure hypercholesterolemia: Secondary | ICD-10-CM | POA: Diagnosis not present

## 2014-12-14 DIAGNOSIS — F329 Major depressive disorder, single episode, unspecified: Secondary | ICD-10-CM | POA: Diagnosis not present

## 2014-12-14 DIAGNOSIS — E039 Hypothyroidism, unspecified: Secondary | ICD-10-CM | POA: Diagnosis not present

## 2014-12-14 DIAGNOSIS — Z23 Encounter for immunization: Secondary | ICD-10-CM | POA: Diagnosis not present

## 2015-01-11 DIAGNOSIS — N939 Abnormal uterine and vaginal bleeding, unspecified: Secondary | ICD-10-CM | POA: Diagnosis not present

## 2015-01-12 DIAGNOSIS — N939 Abnormal uterine and vaginal bleeding, unspecified: Secondary | ICD-10-CM | POA: Diagnosis not present

## 2015-01-12 DIAGNOSIS — R350 Frequency of micturition: Secondary | ICD-10-CM | POA: Diagnosis not present

## 2015-01-12 DIAGNOSIS — N952 Postmenopausal atrophic vaginitis: Secondary | ICD-10-CM | POA: Diagnosis not present

## 2015-02-11 DIAGNOSIS — H35372 Puckering of macula, left eye: Secondary | ICD-10-CM | POA: Diagnosis not present

## 2015-02-11 DIAGNOSIS — H35033 Hypertensive retinopathy, bilateral: Secondary | ICD-10-CM | POA: Diagnosis not present

## 2015-02-11 DIAGNOSIS — H4011X3 Primary open-angle glaucoma, severe stage: Secondary | ICD-10-CM | POA: Diagnosis not present

## 2015-02-16 DIAGNOSIS — N907 Vulvar cyst: Secondary | ICD-10-CM | POA: Diagnosis not present

## 2015-02-25 DIAGNOSIS — N907 Vulvar cyst: Secondary | ICD-10-CM | POA: Diagnosis not present

## 2015-03-04 DIAGNOSIS — H4011X3 Primary open-angle glaucoma, severe stage: Secondary | ICD-10-CM | POA: Diagnosis not present

## 2015-03-05 DIAGNOSIS — H5711 Ocular pain, right eye: Secondary | ICD-10-CM | POA: Diagnosis not present

## 2015-03-15 DIAGNOSIS — H4011X3 Primary open-angle glaucoma, severe stage: Secondary | ICD-10-CM | POA: Diagnosis not present

## 2015-05-12 DIAGNOSIS — H401430 Capsular glaucoma with pseudoexfoliation of lens, bilateral, stage unspecified: Secondary | ICD-10-CM | POA: Diagnosis not present

## 2015-05-12 DIAGNOSIS — H4011X3 Primary open-angle glaucoma, severe stage: Secondary | ICD-10-CM | POA: Diagnosis not present

## 2015-06-07 DIAGNOSIS — F332 Major depressive disorder, recurrent severe without psychotic features: Secondary | ICD-10-CM | POA: Diagnosis not present

## 2015-06-07 DIAGNOSIS — I10 Essential (primary) hypertension: Secondary | ICD-10-CM | POA: Diagnosis not present

## 2015-06-07 DIAGNOSIS — E039 Hypothyroidism, unspecified: Secondary | ICD-10-CM | POA: Diagnosis not present

## 2015-06-13 ENCOUNTER — Inpatient Hospital Stay (HOSPITAL_COMMUNITY)
Admission: EM | Admit: 2015-06-13 | Discharge: 2015-06-18 | DRG: 378 | Disposition: A | Discharge status: Home Health | Payer: Medicare Other | Attending: Family Medicine | Admitting: Family Medicine

## 2015-06-13 ENCOUNTER — Encounter (HOSPITAL_COMMUNITY): Payer: Self-pay

## 2015-06-13 DIAGNOSIS — I10 Essential (primary) hypertension: Secondary | ICD-10-CM | POA: Diagnosis present

## 2015-06-13 DIAGNOSIS — E876 Hypokalemia: Secondary | ICD-10-CM | POA: Diagnosis not present

## 2015-06-13 DIAGNOSIS — Z87891 Personal history of nicotine dependence: Secondary | ICD-10-CM

## 2015-06-13 DIAGNOSIS — Z7982 Long term (current) use of aspirin: Secondary | ICD-10-CM | POA: Diagnosis not present

## 2015-06-13 DIAGNOSIS — Z9660 Presence of unspecified orthopedic joint implant: Secondary | ICD-10-CM | POA: Diagnosis present

## 2015-06-13 DIAGNOSIS — R10813 Right lower quadrant abdominal tenderness: Secondary | ICD-10-CM | POA: Diagnosis not present

## 2015-06-13 DIAGNOSIS — N939 Abnormal uterine and vaginal bleeding, unspecified: Secondary | ICD-10-CM | POA: Diagnosis not present

## 2015-06-13 DIAGNOSIS — R030 Elevated blood-pressure reading, without diagnosis of hypertension: Secondary | ICD-10-CM | POA: Diagnosis not present

## 2015-06-13 DIAGNOSIS — E785 Hyperlipidemia, unspecified: Secondary | ICD-10-CM | POA: Diagnosis present

## 2015-06-13 DIAGNOSIS — D62 Acute posthemorrhagic anemia: Secondary | ICD-10-CM | POA: Diagnosis present

## 2015-06-13 DIAGNOSIS — Z79899 Other long term (current) drug therapy: Secondary | ICD-10-CM | POA: Diagnosis not present

## 2015-06-13 DIAGNOSIS — E039 Hypothyroidism, unspecified: Secondary | ICD-10-CM | POA: Diagnosis present

## 2015-06-13 DIAGNOSIS — Z90710 Acquired absence of both cervix and uterus: Secondary | ICD-10-CM

## 2015-06-13 DIAGNOSIS — K922 Gastrointestinal hemorrhage, unspecified: Secondary | ICD-10-CM | POA: Diagnosis not present

## 2015-06-13 DIAGNOSIS — Y92002 Bathroom of unspecified non-institutional (private) residence single-family (private) house as the place of occurrence of the external cause: Secondary | ICD-10-CM

## 2015-06-13 DIAGNOSIS — K5731 Diverticulosis of large intestine without perforation or abscess with bleeding: Secondary | ICD-10-CM | POA: Diagnosis not present

## 2015-06-13 DIAGNOSIS — K625 Hemorrhage of anus and rectum: Secondary | ICD-10-CM | POA: Diagnosis not present

## 2015-06-13 DIAGNOSIS — H409 Unspecified glaucoma: Secondary | ICD-10-CM | POA: Diagnosis not present

## 2015-06-13 DIAGNOSIS — Z882 Allergy status to sulfonamides status: Secondary | ICD-10-CM | POA: Diagnosis not present

## 2015-06-13 DIAGNOSIS — W19XXXA Unspecified fall, initial encounter: Secondary | ICD-10-CM

## 2015-06-13 DIAGNOSIS — S3993XA Unspecified injury of pelvis, initial encounter: Secondary | ICD-10-CM | POA: Diagnosis not present

## 2015-06-13 DIAGNOSIS — W1809XA Striking against other object with subsequent fall, initial encounter: Secondary | ICD-10-CM | POA: Diagnosis present

## 2015-06-13 DIAGNOSIS — S0990XA Unspecified injury of head, initial encounter: Secondary | ICD-10-CM | POA: Diagnosis not present

## 2015-06-13 DIAGNOSIS — S3991XA Unspecified injury of abdomen, initial encounter: Secondary | ICD-10-CM | POA: Diagnosis not present

## 2015-06-13 HISTORY — DX: Essential (primary) hypertension: I10

## 2015-06-13 LAB — HEPATIC FUNCTION PANEL
ALBUMIN: 3 g/dL — AB (ref 3.5–5.0)
ALK PHOS: 50 U/L (ref 38–126)
ALT: 10 U/L — ABNORMAL LOW (ref 14–54)
AST: 15 U/L (ref 15–41)
BILIRUBIN DIRECT: 0.1 mg/dL (ref 0.1–0.5)
Indirect Bilirubin: 0.2 mg/dL — ABNORMAL LOW (ref 0.3–0.9)
Total Bilirubin: 0.3 mg/dL (ref 0.3–1.2)
Total Protein: 5.1 g/dL — ABNORMAL LOW (ref 6.5–8.1)

## 2015-06-13 LAB — CBC WITH DIFFERENTIAL/PLATELET
BASOS ABS: 0 10*3/uL (ref 0.0–0.1)
Basophils Absolute: 0 10*3/uL (ref 0.0–0.1)
Basophils Absolute: 0 10*3/uL (ref 0.0–0.1)
Basophils Relative: 0 % (ref 0–1)
Basophils Relative: 0 % (ref 0–1)
Basophils Relative: 0 % (ref 0–1)
Eosinophils Absolute: 0.2 10*3/uL (ref 0.0–0.7)
Eosinophils Absolute: 0.2 10*3/uL (ref 0.0–0.7)
Eosinophils Absolute: 0.2 10*3/uL (ref 0.0–0.7)
Eosinophils Relative: 3 % (ref 0–5)
Eosinophils Relative: 2 % (ref 0–5)
Eosinophils Relative: 2 % (ref 0–5)
HCT: 34 % — ABNORMAL LOW (ref 36.0–46.0)
HCT: 30 % — ABNORMAL LOW (ref 36.0–46.0)
HCT: 32 % — ABNORMAL LOW (ref 36.0–46.0)
HEMOGLOBIN: 9.7 g/dL — AB (ref 12.0–15.0)
Hemoglobin: 11.2 g/dL — ABNORMAL LOW (ref 12.0–15.0)
Hemoglobin: 10.3 g/dL — ABNORMAL LOW (ref 12.0–15.0)
Lymphocytes Relative: 16 % (ref 12–46)
Lymphocytes Relative: 16 % (ref 12–46)
Lymphocytes Relative: 16 % (ref 12–46)
Lymphs Abs: 1.1 10*3/uL (ref 0.7–4.0)
Lymphs Abs: 1.1 10*3/uL (ref 0.7–4.0)
Lymphs Abs: 1.2 10*3/uL (ref 0.7–4.0)
MCH: 31 pg (ref 26.0–34.0)
MCH: 30.4 pg (ref 26.0–34.0)
MCH: 30.6 pg (ref 26.0–34.0)
MCHC: 32.9 g/dL (ref 30.0–36.0)
MCHC: 32.2 g/dL (ref 30.0–36.0)
MCHC: 32.3 g/dL (ref 30.0–36.0)
MCV: 94.2 fL (ref 78.0–100.0)
MCV: 94.4 fL (ref 78.0–100.0)
MCV: 94.6 fL (ref 78.0–100.0)
MONO ABS: 0.8 10*3/uL (ref 0.1–1.0)
Monocytes Absolute: 0.7 10*3/uL (ref 0.1–1.0)
Monocytes Absolute: 0.7 10*3/uL (ref 0.1–1.0)
Monocytes Relative: 11 % (ref 3–12)
Monocytes Relative: 10 % (ref 3–12)
Monocytes Relative: 13 % — ABNORMAL HIGH (ref 3–12)
NEUTROS ABS: 4.6 10*3/uL (ref 1.7–7.7)
NEUTROS PCT: 69 % (ref 43–77)
Neutro Abs: 4.7 10*3/uL (ref 1.7–7.7)
Neutro Abs: 5.4 10*3/uL (ref 1.7–7.7)
Neutrophils Relative %: 70 % (ref 43–77)
Neutrophils Relative %: 72 % (ref 43–77)
Platelets: 168 10*3/uL (ref 150–400)
Platelets: 153 10*3/uL (ref 150–400)
Platelets: 166 10*3/uL (ref 150–400)
RBC: 3.61 MIL/uL — ABNORMAL LOW (ref 3.87–5.11)
RBC: 3.17 MIL/uL — AB (ref 3.87–5.11)
RBC: 3.39 MIL/uL — ABNORMAL LOW (ref 3.87–5.11)
RDW: 13.3 % (ref 11.5–15.5)
RDW: 13.3 % (ref 11.5–15.5)
RDW: 13.4 % (ref 11.5–15.5)
WBC: 6.7 10*3/uL (ref 4.0–10.5)
WBC: 6.7 10*3/uL (ref 4.0–10.5)
WBC: 7.6 10*3/uL (ref 4.0–10.5)

## 2015-06-13 LAB — BASIC METABOLIC PANEL
Anion gap: 6 (ref 5–15)
BUN: 25 mg/dL — ABNORMAL HIGH (ref 6–20)
CO2: 28 mmol/L (ref 22–32)
Calcium: 8.8 mg/dL — ABNORMAL LOW (ref 8.9–10.3)
Chloride: 110 mmol/L (ref 101–111)
Creatinine, Ser: 0.97 mg/dL (ref 0.44–1.00)
GFR calc Af Amer: >60 mL/min (ref >60)
GFR calc non Af Amer: 52 mL/min — ABNORMAL LOW (ref >60)
Glucose, Bld: 109 mg/dL — ABNORMAL HIGH (ref 65–99)
Potassium: 3.9 mmol/L (ref 3.5–5.1)
Sodium: 144 mmol/L (ref 135–145)

## 2015-06-13 LAB — PROTIME-INR
INR: 1.26 (ref 0.00–1.49)
Prothrombin Time: 15.9 seconds — ABNORMAL HIGH (ref 11.6–15.2)

## 2015-06-13 LAB — RETICULOCYTES
RBC.: 3.17 MIL/uL — ABNORMAL LOW (ref 3.87–5.11)
RETIC CT PCT: 0.9 % (ref 0.4–3.1)
Retic Count, Absolute: 28.5 10*3/uL (ref 19.0–186.0)

## 2015-06-13 LAB — APTT: aPTT: 35 seconds (ref 24–37)

## 2015-06-13 LAB — LACTIC ACID, PLASMA: Lactic Acid, Venous: 0.8 mmol/L (ref 0.5–2.0)

## 2015-06-13 MED ORDER — SODIUM CHLORIDE 0.9 % IV SOLN
INTRAVENOUS | Status: DC
Start: 2015-06-13 — End: 2015-06-18
  Administered 2015-06-13 – 2015-06-17 (×5): via INTRAVENOUS

## 2015-06-13 NOTE — ED Notes (Signed)
Pt needed brief changed. Pt is still hemorrhaging from her rectum.

## 2015-06-13 NOTE — ED Notes (Signed)
Pt states she fell earlier today and struck face on bathtub.

## 2015-06-13 NOTE — ED Notes (Signed)
Per EMS: Pt began bleeding ~5pm from rectum/vagina. Worse with standing. R Lower quadrant tenderness.

## 2015-06-13 NOTE — ED Provider Notes (Signed)
CSN: [id number]     Arrival date & time 06/13/15  1928 History   First MD Initiated Contact with Patient 06/13/15 1946     Chief Complaint  Patient presents with  . Rectal Bleeding     (Consider location/radiation/quality/duration/timing/severity/associated sxs/prior Treatment) HPI   81 year old female with rectal bleeding. Onset around 5 PM today. Patient not exactly sure of going from her vagina or rectum although it is clearly rectal on my exam. She reports that she leaks blood every time she moves or stands up. No blood thinners. She is status post total hysterectomy. She did have a fall earlier today, but she said she tripped over her carpet. No dizziness, lightheadedness or shortness of breath. Apparently mention some abdominal pain to triage, but is denying to me. She thinks she's previously had a colonoscopy but she cannot remember who this was by.  Past Medical History  Diagnosis Date  . Hypertension    Past Surgical History  Procedure Laterality Date  . Joint replacement     History reviewed. No pertinent family history. History  Substance Use Topics  . Smoking status: Former Research scientist (life sciences)  . Smokeless tobacco: Not on file  . Alcohol Use: No   OB History    No data available     Review of Systems  All systems reviewed and negative, other than as noted in HPI.   Allergies  Sulfa antibiotics  Home Medications   Prior to Admission medications   Not on File   BP 133/88 mmHg  Pulse 65  Temp(Src) 98.2 F (36.8 C) (Oral)  Resp 22  SpO2 99% Physical Exam  Constitutional: She appears well-developed and well-nourished. No distress.  Laying in bed. No acute distress. Room smells of GI bleed.  HENT:  Head: Normocephalic and atraumatic.  Eyes: Conjunctivae are normal. Right eye exhibits no discharge. Left eye exhibits no discharge.  Neck: Neck supple.  Cardiovascular: Normal rate, regular rhythm and normal heart sounds.  Exam reveals no gallop and no friction rub.    No murmur heard. Pulmonary/Chest: Effort normal and breath sounds normal. No respiratory distress.  Abdominal: Soft. She exhibits no distension. There is no tenderness.  Genitourinary:  Large amount of blood filling depends. Clotted dark red blood. Small amount of stool mixed in with it. Patient changed and cleaned. Clearly rectal.  Musculoskeletal: She exhibits no edema or tenderness.  Neurological: She is alert.  Skin: Skin is warm and dry.  Psychiatric: She has a normal mood and affect. Her behavior is normal. Thought content normal.  Nursing note and vitals reviewed.   ED Course  Procedures (including critical care time) Labs Review Labs Reviewed  MRSA PCR SCREENING - Abnormal; Notable for the following:    MRSA by PCR POSITIVE (*)    All other components within normal limits  CBC WITH DIFFERENTIAL/PLATELET - Abnormal; Notable for the following:    RBC 3.61 (*)    Hemoglobin 11.2 (*)    HCT 34.0 (*)    All other components within normal limits  BASIC METABOLIC PANEL - Abnormal; Notable for the following:    Glucose, Bld 109 (*)    BUN 25 (*)    Calcium 8.8 (*)    GFR calc non Af Amer 52 (*)    All other components within normal limits  PROTIME-INR - Abnormal; Notable for the following:    Prothrombin Time 15.9 (*)    All other components within normal limits  RETICULOCYTES - Abnormal; Notable for the following:  RBC. 3.17 (*)    All other components within normal limits  CBC WITH DIFFERENTIAL/PLATELET - Abnormal; Notable for the following:    RBC 3.17 (*)    Hemoglobin 9.7 (*)    HCT 30.0 (*)    Monocytes Relative 13 (*)    All other components within normal limits  HEPATIC FUNCTION PANEL - Abnormal; Notable for the following:    Total Protein 5.1 (*)    Albumin 3.0 (*)    ALT 10 (*)    Indirect Bilirubin 0.2 (*)    All other components within normal limits  CBC WITH DIFFERENTIAL/PLATELET - Abnormal; Notable for the following:    RBC 3.39 (*)    Hemoglobin  10.3 (*)    HCT 32.0 (*)    All other components within normal limits  COMPREHENSIVE METABOLIC PANEL - Abnormal; Notable for the following:    Glucose, Bld 114 (*)    BUN 22 (*)    Calcium 8.0 (*)    Total Protein 4.7 (*)    Albumin 2.7 (*)    ALT 9 (*)    All other components within normal limits  CBC WITH DIFFERENTIAL/PLATELET - Abnormal; Notable for the following:    RBC 2.80 (*)    Hemoglobin 8.5 (*)    HCT 26.5 (*)    Platelets 143 (*)    All other components within normal limits  CBC WITH DIFFERENTIAL/PLATELET - Abnormal; Notable for the following:    RBC 2.95 (*)    Hemoglobin 8.8 (*)    HCT 27.0 (*)    Platelets 126 (*)    All other components within normal limits  HEMOGLOBIN AND HEMATOCRIT, BLOOD - Abnormal; Notable for the following:    Hemoglobin 8.2 (*)    HCT 25.1 (*)    All other components within normal limits  BASIC METABOLIC PANEL - Abnormal; Notable for the following:    Potassium 3.3 (*)    Chloride 115 (*)    Calcium 8.1 (*)    Anion gap 4 (*)    All other components within normal limits  CBC - Abnormal; Notable for the following:    RBC 2.59 (*)    Hemoglobin 7.7 (*)    HCT 23.6 (*)    RDW 15.9 (*)    Platelets 129 (*)    All other components within normal limits  HEMOGLOBIN AND HEMATOCRIT, BLOOD - Abnormal; Notable for the following:    Hemoglobin 7.9 (*)    HCT 23.8 (*)    All other components within normal limits  HEMOGLOBIN AND HEMATOCRIT, BLOOD - Abnormal; Notable for the following:    Hemoglobin 7.2 (*)    HCT 21.8 (*)    All other components within normal limits  HEMOGLOBIN AND HEMATOCRIT, BLOOD - Abnormal; Notable for the following:    Hemoglobin 9.0 (*)    HCT 26.5 (*)    All other components within normal limits  CBC - Abnormal; Notable for the following:    RBC 2.39 (*)    Hemoglobin 7.3 (*)    HCT 21.9 (*)    Platelets 135 (*)    All other components within normal limits  BASIC METABOLIC PANEL - Abnormal; Notable for the  following:    Glucose, Bld 142 (*)    Calcium 8.3 (*)    All other components within normal limits  CBC - Abnormal; Notable for the following:    RBC 2.77 (*)    Hemoglobin 8.3 (*)    HCT 24.9 (*)  RDW 15.7 (*)    All other components within normal limits  BASIC METABOLIC PANEL - Abnormal; Notable for the following:    Potassium 3.2 (*)    Glucose, Bld 118 (*)    Calcium 8.5 (*)    GFR calc non Af Amer 53 (*)    All other components within normal limits  CBC - Abnormal; Notable for the following:    RBC 3.08 (*)    Hemoglobin 9.5 (*)    HCT 27.9 (*)    All other components within normal limits  APTT  LACTIC ACID, PLASMA  TYPE AND SCREEN  ABO/RH  PREPARE RBC (CROSSMATCH)  TYPE AND SCREEN  PREPARE RBC (CROSSMATCH)    Imaging Review No results found.   EKG Interpretation None      MDM   Final diagnoses:  Fall  BRBPR (bright red blood per rectum)  Lower GI bleed    86yF with rectal bleeding. Continuing in ED. Mostly blood mixed with small amount of stool. No blood thinners. Hemodynamically stable. Abdominal exam benign. With continued bleeding and advanced age, will admit for further evaluation.    Virgel Manifold, MD 06/20/15 [phone]

## 2015-06-14 ENCOUNTER — Inpatient Hospital Stay (HOSPITAL_COMMUNITY): Payer: Medicare Other

## 2015-06-14 DIAGNOSIS — H409 Unspecified glaucoma: Secondary | ICD-10-CM

## 2015-06-14 DIAGNOSIS — I10 Essential (primary) hypertension: Secondary | ICD-10-CM | POA: Diagnosis present

## 2015-06-14 DIAGNOSIS — D62 Acute posthemorrhagic anemia: Secondary | ICD-10-CM | POA: Diagnosis present

## 2015-06-14 DIAGNOSIS — E039 Hypothyroidism, unspecified: Secondary | ICD-10-CM | POA: Diagnosis present

## 2015-06-14 LAB — CBC WITH DIFFERENTIAL/PLATELET
BASOS PCT: 0 % (ref 0–1)
Basophils Absolute: 0 10*3/uL (ref 0.0–0.1)
Basophils Absolute: 0 10*3/uL (ref 0.0–0.1)
Basophils Relative: 0 % (ref 0–1)
EOS ABS: 0.1 10*3/uL (ref 0.0–0.7)
EOS PCT: 3 % (ref 0–5)
EOS PCT: 3 % (ref 0–5)
Eosinophils Absolute: 0.2 10*3/uL (ref 0.0–0.7)
HCT: 26.5 % — ABNORMAL LOW (ref 36.0–46.0)
HEMATOCRIT: 27 % — AB (ref 36.0–46.0)
HEMOGLOBIN: 8.5 g/dL — AB (ref 12.0–15.0)
HEMOGLOBIN: 8.8 g/dL — AB (ref 12.0–15.0)
LYMPHS ABS: 1.4 10*3/uL (ref 0.7–4.0)
LYMPHS ABS: 0.9 10*3/uL (ref 0.7–4.0)
LYMPHS PCT: 23 % (ref 12–46)
Lymphocytes Relative: 18 % (ref 12–46)
MCH: 30.4 pg (ref 26.0–34.0)
MCH: 29.8 pg (ref 26.0–34.0)
MCHC: 32.1 g/dL (ref 30.0–36.0)
MCHC: 32.6 g/dL (ref 30.0–36.0)
MCV: 94.6 fL (ref 78.0–100.0)
MCV: 91.5 fL (ref 78.0–100.0)
MONOS PCT: 12 % (ref 3–12)
Monocytes Absolute: 0.7 10*3/uL (ref 0.1–1.0)
Monocytes Absolute: 0.5 10*3/uL (ref 0.1–1.0)
Monocytes Relative: 10 % (ref 3–12)
NEUTROS ABS: 3.5 10*3/uL (ref 1.7–7.7)
NEUTROS PCT: 69 % (ref 43–77)
Neutro Abs: 3.8 10*3/uL (ref 1.7–7.7)
Neutrophils Relative %: 62 % (ref 43–77)
PLATELETS: 143 10*3/uL — AB (ref 150–400)
PLATELETS: 126 10*3/uL — AB (ref 150–400)
RBC: 2.8 MIL/uL — ABNORMAL LOW (ref 3.87–5.11)
RBC: 2.95 MIL/uL — AB (ref 3.87–5.11)
RDW: 13.5 % (ref 11.5–15.5)
RDW: 15.2 % (ref 11.5–15.5)
WBC: 6 10*3/uL (ref 4.0–10.5)
WBC: 5.1 10*3/uL (ref 4.0–10.5)

## 2015-06-14 LAB — COMPREHENSIVE METABOLIC PANEL
ALT: 9 U/L — ABNORMAL LOW (ref 14–54)
AST: 20 U/L (ref 15–41)
Albumin: 2.7 g/dL — ABNORMAL LOW (ref 3.5–5.0)
Alkaline Phosphatase: 47 U/L (ref 38–126)
Anion gap: 5 (ref 5–15)
BUN: 22 mg/dL — ABNORMAL HIGH (ref 6–20)
CO2: 25 mmol/L (ref 22–32)
CREATININE: 0.82 mg/dL (ref 0.44–1.00)
Calcium: 8 mg/dL — ABNORMAL LOW (ref 8.9–10.3)
Chloride: 111 mmol/L (ref 101–111)
GFR calc Af Amer: >60 mL/min (ref >60)
GLUCOSE: 114 mg/dL — AB (ref 65–99)
Potassium: 4 mmol/L (ref 3.5–5.1)
Sodium: 141 mmol/L (ref 135–145)
Total Bilirubin: 0.3 mg/dL (ref 0.3–1.2)
Total Protein: 4.7 g/dL — ABNORMAL LOW (ref 6.5–8.1)

## 2015-06-14 LAB — PREPARE RBC (CROSSMATCH)

## 2015-06-14 LAB — ABO/RH: ABO/RH(D): O POS

## 2015-06-14 LAB — HEMOGLOBIN AND HEMATOCRIT, BLOOD
HEMATOCRIT: 25.1 % — AB (ref 36.0–46.0)
Hemoglobin: 8.2 g/dL — ABNORMAL LOW (ref 12.0–15.0)

## 2015-06-14 LAB — MRSA PCR SCREENING: MRSA by PCR: POSITIVE — AB

## 2015-06-14 MED ORDER — ONDANSETRON HCL 4 MG PO TABS: 4.0000 mg | ORAL_TABLET | Freq: Four times a day (QID) | ORAL | Status: DC | PRN

## 2015-06-14 MED ORDER — SODIUM CHLORIDE 0.9 % IV SOLN
Freq: Once | INTRAVENOUS | Status: AC
  Administered 2015-06-14: 04:00:00 via INTRAVENOUS

## 2015-06-14 MED ORDER — SIMVASTATIN 5 MG PO TABS
5.0000 mg | ORAL_TABLET | Freq: Every day | ORAL | Status: DC
  Administered 2015-06-14 – 2015-06-17 (×4): 5 mg via ORAL
  Filled 2015-06-14 (×6): qty 1

## 2015-06-14 MED ORDER — DORZOLAMIDE HCL 2 % OP SOLN
1.0000 [drp] | Freq: Two times a day (BID) | OPHTHALMIC | Status: DC
  Administered 2015-06-14 – 2015-06-18 (×9): 1 [drp] via OPHTHALMIC
  Filled 2015-06-14: qty 10

## 2015-06-14 MED ORDER — SODIUM CHLORIDE 0.9 % IJ SOLN
3.0000 mL | Freq: Two times a day (BID) | INTRAMUSCULAR | Status: DC
Start: 2015-06-14 — End: 2015-06-18
  Administered 2015-06-14 – 2015-06-18 (×7): 3 mL via INTRAVENOUS

## 2015-06-14 MED ORDER — ONDANSETRON HCL 4 MG/2ML IJ SOLN: 4.0000 mg | Freq: Four times a day (QID) | INTRAMUSCULAR | Status: DC | PRN

## 2015-06-14 MED ORDER — IOHEXOL 300 MG/ML  SOLN: 25.0000 mL | Freq: Once | INTRAMUSCULAR | Status: AC | PRN

## 2015-06-14 MED ORDER — MIRTAZAPINE 15 MG PO TABS
30.0000 mg | ORAL_TABLET | Freq: Every day | ORAL | Status: DC
  Administered 2015-06-14 – 2015-06-17 (×4): 30 mg via ORAL
  Filled 2015-06-14 (×2): qty 1
  Filled 2015-06-14: qty 2
  Filled 2015-06-14: qty 1
  Filled 2015-06-14: qty 2

## 2015-06-14 MED ORDER — CHLORHEXIDINE GLUCONATE CLOTH 2 % EX PADS
6.0000 | MEDICATED_PAD | Freq: Every day | CUTANEOUS | Status: DC
  Administered 2015-06-15 – 2015-06-18 (×4): 6 via TOPICAL

## 2015-06-14 MED ORDER — MUPIROCIN 2 % EX OINT
1.0000 "application " | TOPICAL_OINTMENT | Freq: Two times a day (BID) | CUTANEOUS | Status: DC
  Administered 2015-06-14 – 2015-06-18 (×8): 1 via NASAL
  Filled 2015-06-14 (×2): qty 22

## 2015-06-14 MED ORDER — SODIUM CHLORIDE 0.9 % IV BOLUS (SEPSIS)
500.0000 mL | Freq: Once | INTRAVENOUS | Status: AC
  Administered 2015-06-14: 500 mL via INTRAVENOUS

## 2015-06-14 MED ORDER — SODIUM CHLORIDE 0.9 % IV BOLUS (SEPSIS): 1000.0000 mL | Freq: Once | INTRAVENOUS | Status: DC

## 2015-06-14 MED ORDER — IOHEXOL 300 MG/ML  SOLN: 100.0000 mL | Freq: Once | INTRAMUSCULAR | Status: AC | PRN

## 2015-06-14 MED ORDER — ACETAMINOPHEN 325 MG PO TABS
650.0000 mg | ORAL_TABLET | Freq: Four times a day (QID) | ORAL | Status: DC | PRN
  Administered 2015-06-17: 650 mg via ORAL
  Filled 2015-06-14: qty 2

## 2015-06-14 MED ORDER — LEVOTHYROXINE SODIUM 25 MCG PO TABS
25.0000 ug | ORAL_TABLET | Freq: Every day | ORAL | Status: DC
  Administered 2015-06-14 – 2015-06-17 (×4): 25 ug via ORAL
  Filled 2015-06-14 (×7): qty 1

## 2015-06-14 MED ORDER — LATANOPROST 0.005 % OP SOLN
1.0000 [drp] | Freq: Every day | OPHTHALMIC | Status: DC
  Administered 2015-06-14 – 2015-06-17 (×4): 1 [drp] via OPHTHALMIC
  Filled 2015-06-14: qty 2.5

## 2015-06-14 MED ORDER — ACETAMINOPHEN 650 MG RE SUPP: 650.0000 mg | Freq: Four times a day (QID) | RECTAL | Status: DC | PRN

## 2015-06-14 MED ORDER — PANTOPRAZOLE SODIUM 40 MG IV SOLR
40.0000 mg | Freq: Two times a day (BID) | INTRAVENOUS | Status: DC
  Administered 2015-06-14 – 2015-06-16 (×6): 40 mg via INTRAVENOUS
  Filled 2015-06-14 (×8): qty 40

## 2015-06-14 NOTE — ED Notes (Signed)
Pt speaking with her daughter on the phone

## 2015-06-14 NOTE — Progress Notes (Signed)
PROGRESS NOTE  Margaret Li UEA:[id number] DOB: July 14, 1929 DOA: 06/13/2015 PCP: Marjorie Smolder, MD  HPI/Recap of past 63 hours: 81 year old female with past medical history of hypertension and hypothyroidism on 8/7 which she described as almost pouring out of her. She became concerned and came to the emergency room where initial hemoglobin was at 11.2 but over the course of 6 hours had dropped to as low as 8.5. Renal function not really affected, but patient was noted to be borderline hypotensive. No abdominal pain or nausea/vomiting. Patient started on IV fluids. Currently she is doing somewhat better. CT scan of abdomen and pelvis unremarkable.  Assessment/Plan: Principal Problem:   Acute blood loss anemia secondary to rectal bleeding, suspected diverticular: Given significant drop, monitoring in stepdown. If afternoon hemoglobin stable, will transfer out of the unit. Gastroneurology seeing. Active Problems:    Essential hypertension: Holding antihypertensives in the setting of possible GI bleed    Hypothyroidism: Continue Synthroid   Glaucoma: Continue drops    Code Status: full code  Family Communication: left message with daughters   Disposition Plan: If hemoglobin continues to remain stable, could potentially be discharged as early as next 24-48 hours    Consultants:  Gastroenterology  Procedures:  None  Antibiotics:  None   Objective: BP 96/58 mmHg  Pulse 59  Temp(Src) 98.4 F (36.9 C) (Oral)  Resp 13  Ht 5\' 4"  (1.626 m)  Wt 64.2 kg (141 lb 8.6 oz)  BMI 24.28 kg/m2  SpO2 100%  Intake/Output Summary (Last 24 hours) at 06/14/15 1651 Last data filed at 06/14/15 1500  Gross per 24 hour  Intake    445 ml  Output   1525 ml  Net  -1080 ml   Filed Weights   06/14/15 1600  Weight: 64.2 kg (141 lb 8.6 oz)    Exam:   General:  alert and oriented 3, no acute distress   Cardiovascular: Regular rate and rhythm, S1-S2   Respiratory: Clear to auscultation  bilaterally  Abdomen: soft, nontender, nondistended, positive bowel sounds  Musculoskeletal: No clubbing or cyanosis or edema    Data Reviewed: Basic Metabolic Panel:  Recent Labs Lab 06/13/15 2033 06/14/15 0230  NA 144 141  K 3.9 4.0  CL 110 111  CO2 28 25  GLUCOSE 109* 114*  BUN 25* 22*  CREATININE 0.97 0.82  CALCIUM 8.8* 8.0*   Liver Function Tests:  Recent Labs Lab 06/13/15 2242 06/14/15 0230  AST 15 20  ALT 10* 9*  ALKPHOS 50 47  BILITOT 0.3 0.3  PROT 5.1* 4.7*  ALBUMIN 3.0* 2.7*   No results for input(s): LIPASE, AMYLASE in the last 168 hours. No results for input(s): AMMONIA in the last 168 hours. CBC:  Recent Labs Lab 06/13/15 2033 06/13/15 2242 06/13/15 2315 06/14/15 0230 06/14/15 0629  WBC 6.7 6.7 7.6 6.0 5.1  NEUTROABS 4.7 4.6 5.4 3.8 3.5  HGB 11.2* 9.7* 10.3* 8.5* 8.8*  HCT 34.0* 30.0* 32.0* 26.5* 27.0*  MCV 94.2 94.6 94.4 94.6 91.5  PLT 168 153 166 143* 126*   Cardiac Enzymes:   No results for input(s): CKTOTAL, CKMB, CKMBINDEX, TROPONINI in the last 168 hours. BNP (last 3 results) No results for input(s): BNP in the last 8760 hours.  ProBNP (last 3 results) No results for input(s): PROBNP in the last 8760 hours.  CBG: No results for input(s): GLUCAP in the last 168 hours.  Recent Results (from the past 240 hour(s))  MRSA PCR Screening     Status:  Abnormal   Collection Time: 06/14/15  2:22 PM  Result Value Ref Range Status   MRSA by PCR POSITIVE (A) NEGATIVE Final    Comment:        The GeneXpert MRSA Assay (FDA approved for NASAL specimens only), is one component of a comprehensive MRSA colonization surveillance program. It is not intended to diagnose MRSA infection nor to guide or monitor treatment for MRSA infections. RESULT CALLED TO, READ BACK BY AND VERIFIED WITH: Bethel Born RN 16:20 06/14/15 ([email or web address])      Studies: Ct Head Wo Contrast  06/14/2015   CLINICAL DATA:  Status post fall. Struck face on bathtub.  Concern for head injury. Initial encounter.  EXAM: CT HEAD WITHOUT CONTRAST  TECHNIQUE: Contiguous axial images were obtained from the base of the skull through the vertex without intravenous contrast.  COMPARISON:  CT of the head performed 01/20/2009, and MRI of the brain performed 03/09/2009  FINDINGS: There is no evidence of acute infarction, mass lesion, or intra- or extra-axial hemorrhage on CT.  Prominence of the ventricles and sulci reflects cysts mild cortical volume loss. Mild cerebellar atrophy is noted. Scattered periventricular and subcortical white matter change likely reflects small vessel ischemic microangiopathy.  The brainstem and fourth ventricle are within normal limits. The basal ganglia are unremarkable in appearance. The cerebral hemispheres demonstrate grossly normal gray-white differentiation. No mass effect or midline shift is seen.  Slight deformity of the right side of the nasal bone is of uncertain chronicity. The orbits are within normal limits. The paranasal sinuses and mastoid air cells are well-aerated. No significant soft tissue abnormalities are seen.  IMPRESSION: 1. No evidence of traumatic intracranial injury. 2. Mild cortical volume loss and scattered small vessel ischemic microangiopathy. 3. Slight deformity of the right side of the nasal bone is of uncertain chronicity. Would correlate for associated symptoms.   Electronically Signed   By: Garald Balding M.D.   On: 06/14/2015 04:04   Ct Abdomen Pelvis W Contrast  06/14/2015   CLINICAL DATA:  Acute onset of rectal or vaginal bleeding. Right lower quadrant tenderness. Recent fall. Initial encounter.  EXAM: CT ABDOMEN AND PELVIS WITH CONTRAST  TECHNIQUE: Multidetector CT imaging of the abdomen and pelvis was performed using the standard protocol following bolus administration of intravenous contrast.  CONTRAST:  100 mL of Omnipaque 300 IV contrast  COMPARISON:  Pelvic radiograph performed 02/23/2010, and CT of the abdomen and  pelvis performed 03/31/2004  FINDINGS: The visualized lung bases are clear. Bilateral breast implants are partially imaged. Scattered coronary artery calcifications are seen.  The liver and spleen are unremarkable in appearance. The gallbladder is within normal limits. The pancreas and adrenal glands are unremarkable.  Scattered bilateral renal cysts are seen, measuring up to 5.9 cm in size. A nonobstructing 3 mm stone is noted at the lower pole of the right kidney. Mild scarring is noted at the lower pole of the right kidney. There is no evidence of hydronephrosis. No obstructing ureteral stones are seen.  No free fluid is identified. The small bowel is unremarkable in appearance. The stomach is within normal limits. No acute vascular abnormalities are seen. Scattered calcification is noted along the abdominal aorta and its branches.  The appendix is normal in caliber, without evidence of appendicitis. Diffuse diverticulosis is noted along the entirety of the colon, without evidence for acute diverticulitis.  The bladder is mildly distended and grossly unremarkable. The patient is status post hysterectomy. No suspicious adnexal masses are seen. No inguinal  lymphadenopathy is seen.  No acute osseous abnormalities are identified. The patient's right hip arthroplasty is grossly unremarkable in appearance, though incompletely imaged. Multilevel vacuum phenomenon and sclerotic change are seen along the lumbar spine.  IMPRESSION: 1. No acute abnormality seen to explain the patient's symptoms. 2. Extensive diffuse diverticulosis along the entirety of the colon, without definite evidence of acute diverticulitis. 3. Scattered bilateral renal cysts. Nonobstructing 3 mm stone at the lower pole of the right kidney. Mild right renal scarring noted. 4. Scattered calcification along the abdominal aorta and its branches. 5. Scattered coronary artery calcifications seen. 6. Mild diffuse degenerative change along the lumbar spine.    Electronically Signed   By: Garald Balding M.D.   On: 06/14/2015 04:21    Scheduled Meds: . dorzolamide  1 drop Both Eyes BID  . latanoprost  1 drop Both Eyes QHS  . levothyroxine  25 mcg Oral QAC breakfast  . mirtazapine  30 mg Oral QHS  . pantoprazole (PROTONIX) IV  40 mg Intravenous Q12H  . simvastatin  5 mg Oral q1800  . sodium chloride  3 mL Intravenous Q12H    Continuous Infusions: . sodium chloride 100 mL/hr at 06/13/15 2030     Time spent: 25 minutes  [facility] Pager [phone]. If 7PM-7AM, please contact night-coverage at [email or web address], password [email or web address] 06/14/2015, 4:51 PM  LOS: 1 day

## 2015-06-14 NOTE — ED Notes (Signed)
Attempted report 

## 2015-06-14 NOTE — Consult Note (Signed)
Subjective:   HPI  The patient is an 81 year old female who presents to the emergency room with complaints of rectal bleeding which started last evening. The bleeding occurred a couple of hours after she slipped in the bathroom. She has not had any abdominal pain. The bleeding was described as red and there were some clots. She denies prior GI bleeding. She did not vomit blood. She had a CT scan done of the abdomen which shows diffuse extensive diverticulosis. She has had colonoscopies in the past by Dr. Timmothy Euler, but has not had one in years. There is no report of polyps or tumors.  Review of Systems Denies chest pain or shortness of breath  Past Medical History  Diagnosis Date  . Hypertension    Past Surgical History  Procedure Laterality Date  . Joint replacement     History   Social History  . Marital Status: Divorced    Spouse Name: N/A  . Number of Children: N/A  . Years of Education: N/A   Occupational History  . Not on file.   Social History Main Topics  . Smoking status: Former Research scientist (life sciences)  . Smokeless tobacco: Not on file  . Alcohol Use: No  . Drug Use: No  . Sexual Activity: Not on file   Other Topics Concern  . Not on file   Social History Narrative  . No narrative on file   family history is not on file.  Current facility-administered medications:  .  0.9 %  sodium chloride infusion, , Intravenous, Continuous, Virgel Manifold, MD, Last Rate: 100 mL/hr at 06/13/15 2030 .  acetaminophen (TYLENOL) tablet 650 mg, 650 mg, Oral, Q6H PRN **OR** acetaminophen (TYLENOL) suppository 650 mg, 650 mg, Rectal, Q6H PRN, Lavina Hamman, MD .  dorzolamide (TRUSOPT) 2 % ophthalmic solution 1 drop, 1 drop, Both Eyes, BID, Lavina Hamman, MD, 1 drop at 06/14/15 1002 .  iohexol (OMNIPAQUE) 300 MG/ML solution 100 mL, 100 mL, Intravenous, Once PRN, Medication Radiologist, MD .  iohexol (OMNIPAQUE) 300 MG/ML solution 25 mL, 25 mL, Oral, Once PRN, Medication Radiologist, MD .  latanoprost  (XALATAN) 0.005 % ophthalmic solution 1 drop, 1 drop, Both Eyes, QHS, Lavina Hamman, MD, 1 drop at 06/14/15 0314 .  levothyroxine (SYNTHROID, LEVOTHROID) tablet 25 mcg, 25 mcg, Oral, QAC breakfast, Lavina Hamman, MD, 25 mcg at 06/14/15 1003 .  mirtazapine (REMERON) tablet 30 mg, 30 mg, Oral, QHS, Lavina Hamman, MD, 30 mg at 06/14/15 0330 .  ondansetron (ZOFRAN) tablet 4 mg, 4 mg, Oral, Q6H PRN **OR** ondansetron (ZOFRAN) injection 4 mg, 4 mg, Intravenous, Q6H PRN, Lavina Hamman, MD .  pantoprazole (PROTONIX) injection 40 mg, 40 mg, Intravenous, Q12H, Lavina Hamman, MD, 40 mg at 06/14/15 1006 .  simvastatin (ZOCOR) tablet 5 mg, 5 mg, Oral, q1800, Lavina Hamman, MD .  sodium chloride 0.9 % injection 3 mL, 3 mL, Intravenous, Q12H, Lavina Hamman, MD, 3 mL at 06/14/15 1004 Allergies  Allergen Reactions  . Sulfa Antibiotics     "Long time ago"     Objective:     BP 99/73 mmHg  Pulse 66  Temp(Src) 98.4 F (36.9 C) (Oral)  Resp 16  Ht 5\' 4"  (1.626 m)  SpO2 98%  Alert and oriented in no acute distress  Nonicteric  Heart regular rhythm no murmurs  Lungs clear  Abdomen: Bowel sounds normal, soft, nontender  Laboratory No components found for: D1    Assessment:     Lower  GI bleed most likely diverticular in origin. BUN is not elevated.      Plan:     Supportive care. Transfuse blood as needed. Usually diverticular bleeds will stop on their own without intervention. If bleeding continues, the next thing would be to do a nuclear medicine GI bleeding scan and if activity is found embolization by interventional radiology. Lab Results  Component Value Date   HGB 8.8* 06/14/2015   HGB 8.5* 06/14/2015   HGB 10.3* 06/13/2015   HCT 27.0* 06/14/2015   HCT 26.5* 06/14/2015   HCT 32.0* 06/13/2015   ALKPHOS 47 06/14/2015   ALKPHOS 50 06/13/2015   ALKPHOS 54 02/16/2010   AST 20 06/14/2015   AST 15 06/13/2015   AST 17 02/16/2010   ALT 9* 06/14/2015   ALT 10* 06/13/2015   ALT  11 02/16/2010

## 2015-06-14 NOTE — ED Notes (Signed)
Changed pt's brief again. This time it had bled through onto the chuck under pt.

## 2015-06-14 NOTE — ED Notes (Signed)
Spoke with pts daughter & updated her on the pts status, attempted to return call to [phone] for the pt to speak with her daughter & was unable to reach her, Left voicemail pt updated

## 2015-06-14 NOTE — ED Notes (Signed)
Clear liquid diet ordered.

## 2015-06-14 NOTE — H&P (Signed)
[facility] History and Physical  Patient: Margaret Li  MRN: [id number]  DOB: Jul 28, 1929  DOS: the patient was seen and examined on 06/14/2015 PCP: Marjorie Smolder, MD  Referring physician: Dr. Wilson Singer Chief Complaint: Bright red blood from rectum  HPI: Margaret Li is a 81 y.o. female with Past medical history of essential hypertension, hypothyroidism, dyslipidemia, vascular disease. The patient is presenting with complaint of bright red blood per rectum. The patient mentions that today while she was at home she was going in the bathroom and suddenly she tripped on the bathroom drug and fell and hit the bathtub. Later on she started having pouring bright red blood from her rectum. She initially had some stool with that but later on it was blood with clots. She denies any abdominal pain denies any prior similar bleeding. Denies any nausea or vomiting denies any fever or chills. She denies taking a lot of aspirin or any other medication. She denies any any rash anywhere. Denies any leg swelling. She also denies any focal deficit  The patient is coming from home  At her baseline ambulates without any support And is independent for most of her ADL manages her medication on her own.  Review of Systems: as mentioned in the history of present illness.  A comprehensive review of the other systems is negative.  Past Medical History  Diagnosis Date  . Hypertension    Past Surgical History  Procedure Laterality Date  . Joint replacement     Social History:  reports that she has quit smoking. She does not have any smokeless tobacco history on file. She reports that she does not drink alcohol or use illicit drugs.  Allergies  Allergen Reactions  . Sulfa Antibiotics     "Long time ago"    History reviewed. No pertinent family history.  Prior to Admission medications   Medication Sig Start Date End Date Taking? Authorizing Provider  amLODipine (NORVASC) 5 MG tablet Take 5 mg by  mouth daily. 03/21/15  Yes Historical Provider, MD  aspirin EC 81 MG tablet Take 81 mg by mouth at bedtime.   Yes Historical Provider, MD  bimatoprost (LUMIGAN) 0.03 % ophthalmic solution Place 1 drop into both eyes at bedtime.   Yes Historical Provider, MD  dorzolamide (TRUSOPT) 2 % ophthalmic solution Place 1 drop into both eyes 2 (two) times daily.   Yes Historical Provider, MD  LORazepam (ATIVAN) 2 MG tablet Take 2 mg by mouth 2 (two) times daily as needed for anxiety.  04/27/15  Yes Historical Provider, MD  losartan (COZAAR) 50 MG tablet Take 50 mg by mouth 2 (two) times daily. 06/01/15  Yes Historical Provider, MD  Lutein 20 MG TABS Take 20 mg by mouth daily.   Yes Historical Provider, MD  mirtazapine (REMERON) 30 MG tablet Take 30 mg by mouth at bedtime. 06/07/15  Yes Historical Provider, MD  naproxen sodium (ANAPROX) 220 MG tablet Take 440 mg by mouth 2 (two) times daily as needed (for pain).   Yes Historical Provider, MD  simvastatin (ZOCOR) 10 MG tablet Take 5 mg by mouth daily at 6 PM.  05/21/15  Yes Historical Provider, MD  SYNTHROID 25 MCG tablet Take 25 mcg by mouth daily. 05/12/15  Yes Historical Provider, MD    Physical Exam: Filed Vitals:   06/13/15 2245 06/13/15 2330 06/13/15 2345 06/14/15 0000  BP: 121/69 145/74 140/71 149/70  Pulse:  61  58  Temp:      TempSrc:  Resp: 14 16 18 13   SpO2:  100%      General: Alert, Awake and Oriented to Time, Place and Person. Appear in mild distress Eyes: PERRL ENT: Oral Mucosa clear moist. Neck: no JVD Cardiovascular: S1 and S2 Present, no Murmur, Peripheral Pulses Present Respiratory: Bilateral Air entry equal and Decreased,  Clear to Auscultation, no Crackles, no wheezes Abdomen: Bowel Sound present, Soft and non tender Skin: no Rash Extremities: no Pedal edema, no calf tenderness Neurologic: Grossly no focal neuro deficit.  Labs on Admission:  CBC:  Recent Labs Lab 06/13/15 2033 06/13/15 2242 06/13/15 2315  WBC 6.7 6.7  7.6  NEUTROABS 4.7 4.6 5.4  HGB 11.2* 9.7* 10.3*  HCT 34.0* 30.0* 32.0*  MCV 94.2 94.6 94.4  PLT 168 153 166    CMP     Component Value Date/Time   NA 144 06/13/2015 2033   K 3.9 06/13/2015 2033   CL 110 06/13/2015 2033   CO2 28 06/13/2015 2033   GLUCOSE 109* 06/13/2015 2033   BUN 25* 06/13/2015 2033   CREATININE 0.97 06/13/2015 2033   CALCIUM 8.8* 06/13/2015 2033   PROT 5.1* 06/13/2015 2242   ALBUMIN 3.0* 06/13/2015 2242   AST 15 06/13/2015 2242   ALT 10* 06/13/2015 2242   ALKPHOS 50 06/13/2015 2242   BILITOT 0.3 06/13/2015 2242   GFRNONAA 52* 06/13/2015 2033   GFRAA >60 06/13/2015 2033    No results for input(s): LIPASE, AMYLASE in the last 168 hours.  No results for input(s): CKTOTAL, CKMB, CKMBINDEX, TROPONINI in the last 168 hours. BNP (last 3 results) No results for input(s): BNP in the last 8760 hours.  ProBNP (last 3 results) No results for input(s): PROBNP in the last 8760 hours.   Radiological Exams on Admission: Ct Head Wo Contrast  06/14/2015   CLINICAL DATA:  Status post fall. Struck face on bathtub. Concern for head injury. Initial encounter.  EXAM: CT HEAD WITHOUT CONTRAST  TECHNIQUE: Contiguous axial images were obtained from the base of the skull through the vertex without intravenous contrast.  COMPARISON:  CT of the head performed 01/20/2009, and MRI of the brain performed 03/09/2009  FINDINGS: There is no evidence of acute infarction, mass lesion, or intra- or extra-axial hemorrhage on CT.  Prominence of the ventricles and sulci reflects cysts mild cortical volume loss. Mild cerebellar atrophy is noted. Scattered periventricular and subcortical white matter change likely reflects small vessel ischemic microangiopathy.  The brainstem and fourth ventricle are within normal limits. The basal ganglia are unremarkable in appearance. The cerebral hemispheres demonstrate grossly normal gray-white differentiation. No mass effect or midline shift is seen.  Slight  deformity of the right side of the nasal bone is of uncertain chronicity. The orbits are within normal limits. The paranasal sinuses and mastoid air cells are well-aerated. No significant soft tissue abnormalities are seen.  IMPRESSION: 1. No evidence of traumatic intracranial injury. 2. Mild cortical volume loss and scattered small vessel ischemic microangiopathy. 3. Slight deformity of the right side of the nasal bone is of uncertain chronicity. Would correlate for associated symptoms.   Electronically Signed   By: Garald Balding M.D.   On: 06/14/2015 04:04   Ct Abdomen Pelvis W Contrast  06/14/2015   CLINICAL DATA:  Acute onset of rectal or vaginal bleeding. Right lower quadrant tenderness. Recent fall. Initial encounter.  EXAM: CT ABDOMEN AND PELVIS WITH CONTRAST  TECHNIQUE: Multidetector CT imaging of the abdomen and pelvis was performed using the standard protocol following bolus  administration of intravenous contrast.  CONTRAST:  100 mL of Omnipaque 300 IV contrast  COMPARISON:  Pelvic radiograph performed 02/23/2010, and CT of the abdomen and pelvis performed 03/31/2004  FINDINGS: The visualized lung bases are clear. Bilateral breast implants are partially imaged. Scattered coronary artery calcifications are seen.  The liver and spleen are unremarkable in appearance. The gallbladder is within normal limits. The pancreas and adrenal glands are unremarkable.  Scattered bilateral renal cysts are seen, measuring up to 5.9 cm in size. A nonobstructing 3 mm stone is noted at the lower pole of the right kidney. Mild scarring is noted at the lower pole of the right kidney. There is no evidence of hydronephrosis. No obstructing ureteral stones are seen.  No free fluid is identified. The small bowel is unremarkable in appearance. The stomach is within normal limits. No acute vascular abnormalities are seen. Scattered calcification is noted along the abdominal aorta and its branches.  The appendix is normal in  caliber, without evidence of appendicitis. Diffuse diverticulosis is noted along the entirety of the colon, without evidence for acute diverticulitis.  The bladder is mildly distended and grossly unremarkable. The patient is status post hysterectomy. No suspicious adnexal masses are seen. No inguinal lymphadenopathy is seen.  No acute osseous abnormalities are identified. The patient's right hip arthroplasty is grossly unremarkable in appearance, though incompletely imaged. Multilevel vacuum phenomenon and sclerotic change are seen along the lumbar spine.  IMPRESSION: 1. No acute abnormality seen to explain the patient's symptoms. 2. Extensive diffuse diverticulosis along the entirety of the colon, without definite evidence of acute diverticulitis. 3. Scattered bilateral renal cysts. Nonobstructing 3 mm stone at the lower pole of the right kidney. Mild right renal scarring noted. 4. Scattered calcification along the abdominal aorta and its branches. 5. Scattered coronary artery calcifications seen. 6. Mild diffuse degenerative change along the lumbar spine.   Electronically Signed   By: Garald Balding M.D.   On: 06/14/2015 04:21   Assessment/Plan Principal Problem:   BRBPR (bright red blood per rectum) Active Problems:   Rectal bleeding   Essential hypertension   Hypothyroidism   1. BRBPR (bright red blood per rectum) The patient is presenting with complaints of bright red blood per rectum. She is Hemoccult-positive. H&H is dropping. She is hemodynamically stable. With this she will be admitted in stepdown unit. She has so far lost 100+ cc of blood as per RN. Discuss with GI in the morning. INR stable lactic acid normal CT scan of the abdomen is also not showing any acute abnormality. The patient is status post hysterectomy.  2. History of hypertension. Continuing holding her medication at present.  3. Hypothyroidism. Continuing her Synthroid.  4. Fall. Patient denies having any acute  pain at the time of my evaluation CT scan of the head is unremarkable for any intracranial bleeding. No focal deficit on examination. We will continue to closely monitor.  Advance goals of care discussion: Full code as per my discussion   DVT Prophylaxis:mechanical compression device Nutrition: Nothing by mouth except medications  Disposition: Admitted as inpatient, step-down unit.  Author: Berle Mull, MD [facility] Pager: [phone] 06/14/2015  If 7PM-7AM, please contact night-coverage [email or web address] Password [email or web address]

## 2015-06-14 NOTE — Progress Notes (Signed)
UR COMPLETED  

## 2015-06-15 ENCOUNTER — Inpatient Hospital Stay (HOSPITAL_COMMUNITY): Payer: Medicare Other

## 2015-06-15 LAB — TYPE AND SCREEN
ABO/RH(D): O POS
Antibody Screen: NEGATIVE
Unit division: 0

## 2015-06-15 LAB — CBC
HCT: 23.6 % — ABNORMAL LOW (ref 36.0–46.0)
HEMOGLOBIN: 7.7 g/dL — AB (ref 12.0–15.0)
MCH: 29.7 pg (ref 26.0–34.0)
MCHC: 32.6 g/dL (ref 30.0–36.0)
MCV: 91.1 fL (ref 78.0–100.0)
PLATELETS: 129 10*3/uL — AB (ref 150–400)
RBC: 2.59 MIL/uL — ABNORMAL LOW (ref 3.87–5.11)
RDW: 15.9 % — ABNORMAL HIGH (ref 11.5–15.5)
WBC: 4.2 10*3/uL (ref 4.0–10.5)

## 2015-06-15 LAB — BASIC METABOLIC PANEL
Anion gap: 4 — ABNORMAL LOW (ref 5–15)
BUN: 14 mg/dL (ref 6–20)
CO2: 23 mmol/L (ref 22–32)
Calcium: 8.1 mg/dL — ABNORMAL LOW (ref 8.9–10.3)
Chloride: 115 mmol/L — ABNORMAL HIGH (ref 101–111)
Creatinine, Ser: 0.69 mg/dL (ref 0.44–1.00)
GFR calc non Af Amer: >60 mL/min (ref >60)
Glucose, Bld: 95 mg/dL (ref 65–99)
POTASSIUM: 3.3 mmol/L — AB (ref 3.5–5.1)
Sodium: 142 mmol/L (ref 135–145)

## 2015-06-15 LAB — HEMOGLOBIN AND HEMATOCRIT, BLOOD
HEMATOCRIT: 23.8 % — AB (ref 36.0–46.0)
Hemoglobin: 7.9 g/dL — ABNORMAL LOW (ref 12.0–15.0)

## 2015-06-15 MED ORDER — LORAZEPAM 2 MG/ML IJ SOLN: 0.5000 mg | Freq: Four times a day (QID) | INTRAMUSCULAR | Status: DC | PRN

## 2015-06-15 MED ORDER — POTASSIUM CHLORIDE 20 MEQ/15ML (10%) PO SOLN
40.0000 meq | ORAL | Status: AC
  Administered 2015-06-15: 40 meq via ORAL
  Filled 2015-06-15: qty 30

## 2015-06-15 MED ORDER — TECHNETIUM TC 99M-LABELED RED BLOOD CELLS IV KIT
25.0000 | PACK | Freq: Once | INTRAVENOUS | Status: AC | PRN
  Administered 2015-06-15: 25 via INTRAVENOUS

## 2015-06-15 NOTE — Care Management Note (Signed)
Case Management Note  Patient Details  Name: Margaret Li MRN: [id number] Date of Birth: 07-Aug-1929  Subjective/Objective:                 PTA from home alone admitted with GI BLEED.     Action/Plan: Discharge planning  Expected Discharge Date:                  Expected Discharge Plan:  Home/Self Care  In-House Referral:     Discharge planning Services  CM Consult  Post Acute Care Choice:    Choice offered to:     DME Arranged:    DME Agency:     HH Arranged:    HH Agency:     Status of Service:  In process, will continue to follow  Medicare Important Message Given:    Date Medicare IM Given:    Medicare IM give by:    Date Additional Medicare IM Given:    Additional Medicare Important Message give by:     If discussed at [place] of Stay Meetings, dates discussed:    Additional Comments: Rosaria Ferries [facility]) [phone] , Theodis Aguas (Daughter) [phone] or [phone], [facility]/ Sharma Covert [phone]  Whitman Hero [place], [place] [phone] 06/15/2015, 10:09 AM

## 2015-06-15 NOTE — Plan of Care (Signed)
Problem: Phase I Progression Outcomes Goal: Pain controlled with appropriate interventions Outcome: Completed/Met Date Met:  06/15/15 Patient denies any pain

## 2015-06-15 NOTE — Progress Notes (Signed)
@  0650 pt called this RN into room endorsing "feeling funny" in her chest and wanting the MD called. VSS were stable, lungs clear, and pt denied chest pain. Pt did appear anxious. Pt was reassured and I endorsed MD would be along to see her this AM. Also provided pt phone to contact daughter. After conversation with daughter pt appeared more at ease. This information conveyed to Day RN.

## 2015-06-15 NOTE — Progress Notes (Signed)
[facility] Progress Note  Subjective: The patient had some further rectal bleeding this morning.  Objective: Vital signs in last 24 hours: Temp:  [97.8 F (36.6 C)-98.9 F (37.2 C)] 98.9 F (37.2 C) (08/09 1130) Pulse Rate:  [59-82] 74 (08/09 1130) Resp:  [10-20] 16 (08/09 1130) BP: (82-160)/(58-99) 118/71 mmHg (08/09 1130) SpO2:  [98 %-100 %] 100 % (08/09 1130) Weight:  [64.198 kg (141 lb 8.5 oz)-64.2 kg (141 lb 8.6 oz)] 64.198 kg (141 lb 8.5 oz) (08/09 0350) Weight change:    PE:  No distress  Heart regular rhythm  Lungs clear  Abdomen: Soft and nontender  Lab Results: Results for orders placed or performed during the hospital encounter of 06/13/15 (from the past 24 hour(s))  MRSA PCR Screening     Status: Abnormal   Collection Time: 06/14/15  2:22 PM  Result Value Ref Range   MRSA by PCR POSITIVE (A) NEGATIVE  Hemoglobin and hematocrit, blood     Status: Abnormal   Collection Time: 06/14/15  7:20 PM  Result Value Ref Range   Hemoglobin 8.2 (L) 12.0 - 15.0 g/dL   HCT 25.1 (L) 36.0 - 54.6 %  Basic metabolic panel     Status: Abnormal   Collection Time: 06/15/15  2:33 AM  Result Value Ref Range   Sodium 142 135 - 145 mmol/L   Potassium 3.3 (L) 3.5 - 5.1 mmol/L   Chloride 115 (H) 101 - 111 mmol/L   CO2 23 22 - 32 mmol/L   Glucose, Bld 95 65 - 99 mg/dL   BUN 14 6 - 20 mg/dL   Creatinine, Ser 0.69 0.44 - 1.00 mg/dL   Calcium 8.1 (L) 8.9 - 10.3 mg/dL   GFR calc non Af Amer >60 >60 mL/min   GFR calc Af Amer >60 >60 mL/min   Anion gap 4 (L) 5 - 15  CBC     Status: Abnormal   Collection Time: 06/15/15  2:33 AM  Result Value Ref Range   WBC 4.2 4.0 - 10.5 K/uL   RBC 2.59 (L) 3.87 - 5.11 MIL/uL   Hemoglobin 7.7 (L) 12.0 - 15.0 g/dL   HCT 23.6 (L) 36.0 - 46.0 %   MCV 91.1 78.0 - 100.0 fL   MCH 29.7 26.0 - 34.0 pg   MCHC 32.6 30.0 - 36.0 g/dL   RDW 15.9 (H) 11.5 - 15.5 %   Platelets 129 (L) 150 - 400 K/uL    Studies/Results: No results  found.    Assessment: Lower gastrointestinal bleeding. Most likely diverticular.  Plan:   Proceed with nuclear medicine GI bleeding scan. Follow H&H. Transfuse as needed.    Cassell Clement 06/15/2015, 12:31 PM  Pager: [phone] If no answer or after 5 PM call [phone] Lab Results  Component Value Date   HGB 7.7* 06/15/2015   HGB 8.2* 06/14/2015   HGB 8.8* 06/14/2015   HCT 23.6* 06/15/2015   HCT 25.1* 06/14/2015   HCT 27.0* 06/14/2015   ALKPHOS 47 06/14/2015   ALKPHOS 50 06/13/2015   ALKPHOS 54 02/16/2010   AST 20 06/14/2015   AST 15 06/13/2015   AST 17 02/16/2010   ALT 9* 06/14/2015   ALT 10* 06/13/2015   ALT 11 02/16/2010

## 2015-06-15 NOTE — Progress Notes (Signed)
PROGRESS NOTE  Margaret Li SHF:[id number] DOB: [birth year] DOA: 06/13/2015 PCP: Marjorie Smolder, MD  HPI/Recap of past 85 hours: 81 year old female with past medical history of hypertension and hypothyroidism on 8/7 which she described as almost pouring out of her. She became concerned and came to the emergency room where initial hemoglobin was at 11.2 but over the course of 6 hours had dropped to as low as 8.5. Renal function not really affected, but patient was noted to be borderline hypotensive. No abdominal pain or nausea/vomiting. Patient started on IV fluids. Currently she is doing somewhat better. CT scan of abdomen and pelvis unremarkable.  By following morning, hemoglobin had dropped further to 7.7. Patient had large bloody bowel movement and blood pressure soft. Changed to nothing by mouth and tagged red blood cell scan done on 8/9 evening which was negative. Follow-up H/H noted slight improvement at 7.9.  Assessment/Plan: Principal Problem:   Acute blood loss anemia secondary to rectal bleeding, suspected diverticular:  Gastroenterology following.  Tagged red blood cell scan and follow-up H/H look like bleeding has finally stopped. We start clear liquids. Keep in stepdown overnight given the events of this morning. If things stay stable, will transfer out of the unit tomorrow and advance diet. Physical therapy to see. Active Problems:    Essential hypertension: Holding antihypertensives in the setting of possible GI bleed    Hypothyroidism: Continue Synthroid   Glaucoma: Continue drops    Code Status: full code   Family Communication: Spoke with her daughter by phone  Disposition Plan: If hemoglobin continues to remain stable, anticipate discharge on Thursday 8/10. Daughter will be in from out of town by then and will stay with patient   Consultants:  Gastroenterology  Procedures:  None  Antibiotics:  None   Objective: BP 121/68 mmHg  Pulse 71  Temp(Src) 99.3 F  (37.4 C) (Oral)  Resp 12  Ht 5\' 4"  (1.626 m)  Wt 64.198 kg (141 lb 8.5 oz)  BMI 24.28 kg/m2  SpO2 100%  Intake/Output Summary (Last 24 hours) at 06/15/15 1912 Last data filed at 06/15/15 1900  Gross per 24 hour  Intake   2410 ml  Output    150 ml  Net   2260 ml   Filed Weights   06/14/15 1600 06/15/15 0350  Weight: 64.2 kg (141 lb 8.6 oz) 64.198 kg (141 lb 8.5 oz)    Exam:   General:  alert and oriented 3, quite fatigued  Cardiovascular: Regular rate and rhythm, S1-S2   Respiratory: Clear to auscultation bilaterally  Abdomen: soft, nontender, nondistended, positive bowel sounds  Musculoskeletal: No clubbing or cyanosis or edema    Data Reviewed: Basic Metabolic Panel:  Recent Labs Lab 06/13/15 2033 06/14/15 0230 06/15/15 0233  NA 144 141 142  K 3.9 4.0 3.3*  CL 110 111 115*  CO2 28 25 23   GLUCOSE 109* 114* 95  BUN 25* 22* 14  CREATININE 0.97 0.82 0.69  CALCIUM 8.8* 8.0* 8.1*   Liver Function Tests:  Recent Labs Lab 06/13/15 2242 06/14/15 0230  AST 15 20  ALT 10* 9*  ALKPHOS 50 47  BILITOT 0.3 0.3  PROT 5.1* 4.7*  ALBUMIN 3.0* 2.7*   No results for input(s): LIPASE, AMYLASE in the last 168 hours. No results for input(s): AMMONIA in the last 168 hours. CBC:  Recent Labs Lab 06/13/15 2033 06/13/15 2242 06/13/15 2315 06/14/15 0230 06/14/15 0629 06/14/15 1920 06/15/15 0233 06/15/15 1852  WBC 6.7 6.7 7.6 6.0 5.1  --  4.2  --   NEUTROABS 4.7 4.6 5.4 3.8 3.5  --   --   --   HGB 11.2* 9.7* 10.3* 8.5* 8.8* 8.2* 7.7* 7.9*  HCT 34.0* 30.0* 32.0* 26.5* 27.0* 25.1* 23.6* 23.8*  MCV 94.2 94.6 94.4 94.6 91.5  --  91.1  --   PLT 168 153 166 143* 126*  --  129*  --    Cardiac Enzymes:   No results for input(s): CKTOTAL, CKMB, CKMBINDEX, TROPONINI in the last 168 hours. BNP (last 3 results) No results for input(s): BNP in the last 8760 hours.  ProBNP (last 3 results) No results for input(s): PROBNP in the last 8760 hours.  CBG: No results for  input(s): GLUCAP in the last 168 hours.  Recent Results (from the past 240 hour(s))  MRSA PCR Screening     Status: Abnormal   Collection Time: 06/14/15  2:22 PM  Result Value Ref Range Status   MRSA by PCR POSITIVE (A) NEGATIVE Final    Comment:        The GeneXpert MRSA Assay (FDA approved for NASAL specimens only), is one component of a comprehensive MRSA colonization surveillance program. It is not intended to diagnose MRSA infection nor to guide or monitor treatment for MRSA infections. RESULT CALLED TO, READ BACK BY AND VERIFIED WITH: Bethel Born RN 16:20 06/14/15 ([email or web address])      Studies: Nm Gi Blood Loss  06/15/2015   CLINICAL DATA:  Active GI bleed.  EXAM: NUCLEAR MEDICINE GASTROINTESTINAL BLEEDING SCAN  TECHNIQUE: Sequential abdominal images were obtained following intravenous administration of Tc-41m labeled red blood cells.  RADIOPHARMACEUTICALS:  Twenty-five mCi Tc-63m in-vitro labeled red cells.  COMPARISON:  CT scan 06/14/2015  FINDINGS: No obvious active bleeding site is identified.  IMPRESSION: No obvious active bleeding site.   Electronically Signed   By: Marijo Sanes M.D.   On: 06/15/2015 18:21    Scheduled Meds: . Chlorhexidine Gluconate Cloth  6 each Topical Q0600  . dorzolamide  1 drop Both Eyes BID  . latanoprost  1 drop Both Eyes QHS  . levothyroxine  25 mcg Oral QAC breakfast  . mirtazapine  30 mg Oral QHS  . mupirocin ointment  1 application Nasal BID  . pantoprazole (PROTONIX) IV  40 mg Intravenous Q12H  . simvastatin  5 mg Oral q1800  . sodium chloride  3 mL Intravenous Q12H    Continuous Infusions: . sodium chloride 100 mL/hr at 06/15/15 1830     Time spent: 35 minutes  [facility] Pager [phone]. If 7PM-7AM, please contact night-coverage at [email or web address], password [email or web address] 06/15/2015, 7:12 PM  LOS: 2 days

## 2015-06-16 LAB — HEMOGLOBIN AND HEMATOCRIT, BLOOD
HCT: 21.8 % — ABNORMAL LOW (ref 36.0–46.0)
HCT: 26.5 % — ABNORMAL LOW (ref 36.0–46.0)
Hemoglobin: 7.2 g/dL — ABNORMAL LOW (ref 12.0–15.0)
Hemoglobin: 9 g/dL — ABNORMAL LOW (ref 12.0–15.0)

## 2015-06-16 MED ORDER — PANTOPRAZOLE SODIUM 40 MG PO TBEC
40.0000 mg | DELAYED_RELEASE_TABLET | Freq: Two times a day (BID) | ORAL | Status: DC
  Administered 2015-06-16 – 2015-06-18 (×4): 40 mg via ORAL
  Filled 2015-06-16 (×4): qty 1

## 2015-06-16 MED ORDER — ENSURE ENLIVE PO LIQD
237.0000 mL | Freq: Two times a day (BID) | ORAL | Status: DC
  Administered 2015-06-16 – 2015-06-18 (×3): 237 mL via ORAL

## 2015-06-16 NOTE — Progress Notes (Signed)
PROGRESS NOTE  Margaret Li OZD:[id number] DOB: 1929/08/22 DOA: 06/13/2015 PCP: Marjorie Smolder, MD  HPI/Recap of past 44 hours: 81 year old female with past medical history of hypertension and hypothyroidism on 8/7 which she described as almost pouring out of her. She became concerned and came to the emergency room where initial hemoglobin was at 11.2 but over the course of 6 hours had dropped to as low as 8.5. Renal function not really affected, but patient was noted to be borderline hypotensive. No abdominal pain or nausea/vomiting. Patient started on IV fluids. Currently she is doing somewhat better. CT scan of abdomen and pelvis unremarkable.  By following morning, hemoglobin had dropped further to 7.7. Patient had large bloody bowel movement and blood pressure soft. Changed to nothing by mouth and tagged red blood cell scan done on 8/9 evening which was negative.   Follow-up hemoglobin that evening stable, although patient had small drop again this morning down to 7.2. Vital signs stable. Patient feeling better. Seen by physical therapy. Tolerating clear liquids.  Assessment/Plan: Principal Problem:   Acute blood loss anemia secondary to rectal bleeding, suspected diverticular:  Gastroenterology following.  Tagged red blood cell scan and follow-up H/H look like bleeding has finally stopped. Advance diet. Recheck hemoglobin this afternoon and again tomorrow morning. If stable, could discharge home. PT recommended home health. Gastroenterology feeling patient is stable and have signed off Active Problems:    Essential hypertension: Holding antihypertensives in the setting of possible GI bleed    Hypothyroidism: Continue Synthroid   Glaucoma: Continue drops    Code Status: full code   Family Communication: Spoke with her daughter by phone on 8/9  Disposition Plan: If hemoglobin continues to remain stable, anticipate discharge on Thursday 8/11. Daughter will be in from out of town by then  and will stay with patient   Consultants:  Gastroenterology  Procedures:  None  Antibiotics:  None   Objective: BP 129/85 mmHg  Pulse 80  Temp(Src) 98.8 F (37.1 C) (Oral)  Resp 25  Ht 5\' 4"  (1.626 m)  Wt 65.6 kg (144 lb 10 oz)  BMI 24.81 kg/m2  SpO2 100%  Intake/Output Summary (Last 24 hours) at 06/16/15 1351 Last data filed at 06/16/15 1100  Gross per 24 hour  Intake   2364 ml  Output   2075 ml  Net    289 ml   Filed Weights   06/14/15 1600 06/15/15 0350 06/16/15 0437  Weight: 64.2 kg (141 lb 8.6 oz) 64.198 kg (141 lb 8.5 oz) 65.6 kg (144 lb 10 oz)    Exam:   General:  alert and oriented 3, no acute distress  Cardiovascular: Regular rate and rhythm, S1-S2   Respiratory: Clear to auscultation bilaterally  Abdomen: soft, nontender, nondistended, positive bowel sounds  Musculoskeletal: No clubbing or cyanosis or edema    Data Reviewed: Basic Metabolic Panel:  Recent Labs Lab 06/13/15 2033 06/14/15 0230 06/15/15 0233  NA 144 141 142  K 3.9 4.0 3.3*  CL 110 111 115*  CO2 28 25 23   GLUCOSE 109* 114* 95  BUN 25* 22* 14  CREATININE 0.97 0.82 0.69  CALCIUM 8.8* 8.0* 8.1*   Liver Function Tests:  Recent Labs Lab 06/13/15 2242 06/14/15 0230  AST 15 20  ALT 10* 9*  ALKPHOS 50 47  BILITOT 0.3 0.3  PROT 5.1* 4.7*  ALBUMIN 3.0* 2.7*   No results for input(s): LIPASE, AMYLASE in the last 168 hours. No results for input(s): AMMONIA in the last  168 hours. CBC:  Recent Labs Lab 06/13/15 2033 06/13/15 2242 06/13/15 2315 06/14/15 0230 06/14/15 0629 06/14/15 1920 06/15/15 0233 06/15/15 1852 06/16/15 0343  WBC 6.7 6.7 7.6 6.0 5.1  --  4.2  --   --   NEUTROABS 4.7 4.6 5.4 3.8 3.5  --   --   --   --   HGB 11.2* 9.7* 10.3* 8.5* 8.8* 8.2* 7.7* 7.9* 7.2*  HCT 34.0* 30.0* 32.0* 26.5* 27.0* 25.1* 23.6* 23.8* 21.8*  MCV 94.2 94.6 94.4 94.6 91.5  --  91.1  --   --   PLT 168 153 166 143* 126*  --  129*  --   --    Cardiac Enzymes:   No  results for input(s): CKTOTAL, CKMB, CKMBINDEX, TROPONINI in the last 168 hours. BNP (last 3 results) No results for input(s): BNP in the last 8760 hours.  ProBNP (last 3 results) No results for input(s): PROBNP in the last 8760 hours.  CBG: No results for input(s): GLUCAP in the last 168 hours.  Recent Results (from the past 240 hour(s))  MRSA PCR Screening     Status: Abnormal   Collection Time: 06/14/15  2:22 PM  Result Value Ref Range Status   MRSA by PCR POSITIVE (A) NEGATIVE Final    Comment:        The GeneXpert MRSA Assay (FDA approved for NASAL specimens only), is one component of a comprehensive MRSA colonization surveillance program. It is not intended to diagnose MRSA infection nor to guide or monitor treatment for MRSA infections. RESULT CALLED TO, READ BACK BY AND VERIFIED WITH: Bethel Born RN 16:20 06/14/15 ([email or web address])      Studies: Nm Gi Blood Loss  06/15/2015   CLINICAL DATA:  Active GI bleed.  EXAM: NUCLEAR MEDICINE GASTROINTESTINAL BLEEDING SCAN  TECHNIQUE: Sequential abdominal images were obtained following intravenous administration of Tc-54m labeled red blood cells.  RADIOPHARMACEUTICALS:  Twenty-five mCi Tc-81m in-vitro labeled red cells.  COMPARISON:  CT scan 06/14/2015  FINDINGS: No obvious active bleeding site is identified.  IMPRESSION: No obvious active bleeding site.   Electronically Signed   By: Marijo Sanes M.D.   On: 06/15/2015 18:21    Scheduled Meds: . Chlorhexidine Gluconate Cloth  6 each Topical Q0600  . dorzolamide  1 drop Both Eyes BID  . latanoprost  1 drop Both Eyes QHS  . levothyroxine  25 mcg Oral QAC breakfast  . mirtazapine  30 mg Oral QHS  . mupirocin ointment  1 application Nasal BID  . pantoprazole  40 mg Oral BID  . simvastatin  5 mg Oral q1800  . sodium chloride  3 mL Intravenous Q12H    Continuous Infusions: . sodium chloride 50 mL/hr at 06/16/15 1021     Time spent: 15 minutes  [place]  [facility] Pager [phone]. If 7PM-7AM, please contact night-coverage at [email or web address], password [facility] 06/16/2015, 1:51 PM  LOS: 3 days

## 2015-06-16 NOTE — Care Management Important Message (Signed)
Important Message  Patient Details  Name: Margaret Li MRN: [id number] Date of Birth: 1928-12-30   Medicare Important Message Given:  Yes-second notification given    Nathen May 06/16/2015, 11:55 AMImportant Message  Patient Details  Name: Margaret Li MRN: [id number] Date of Birth: 1929-09-03   Medicare Important Message Given:  Yes-second notification given    Nathen May 06/16/2015, 11:55 AM

## 2015-06-16 NOTE — Evaluation (Signed)
Physical Therapy Evaluation Patient Details Name: Margaret Li MRN: [id number] DOB: Oct 04, 1929 Today's Date: 06/16/2015   History of Present Illness  Margaret Li is a 81 y.o. female with Past medical history of essential hypertension, hypothyroidism, dyslipidemia, vascular disease.  Admitted with lower GI bleed after falling in the bathroom and hitting bathtub.  Clinical Impression  Patient presents with decreased independence with mobility due to deficits listed in PT problem list.  She will benefit from skilled PT in the acute setting to allow return home with daughter assist initially and HHPT at d/c.    Follow Up Recommendations Home health PT    Equipment Recommendations  None recommended by PT    Recommendations for Other Services       Precautions / Restrictions Precautions Precautions: Fall      Mobility  Bed Mobility               General bed mobility comments: pt out of bed in recliner  Transfers Overall transfer level: Needs assistance Equipment used: Rolling walker (2 wheeled) Transfers: Sit to/from Stand Sit to Stand: Supervision         General transfer comment: cues for hand placement  Ambulation/Gait Ambulation/Gait assistance: Supervision;Min guard Ambulation Distance (Feet): 150 Feet Assistive device: Rolling walker (2 wheeled) Gait Pattern/deviations: Step-through pattern;Decreased stride length     General Gait Details: slower speed, cues/assist to stay inside walker  Stairs            Wheelchair Mobility    Modified Rankin (Stroke Patients Only)       Balance Overall balance assessment: Needs assistance         Standing balance support: No upper extremity supported Standing balance-Leahy Scale: Fair Standing balance comment: stood at sink and combed hair without UE support, helpful to use walker for ambulation                             Pertinent Vitals/Pain Pain Assessment: No/denies pain    Home  Living Family/patient expects to be discharged to:: Private residence Living Arrangements: Alone Available Help at Discharge: Family;Available 24 hours/day (daughter coming to assist from [place] for short term) Type of Home: House Home Access: Stairs to enter Entrance Stairs-Rails: None Entrance Stairs-Number of Steps: 1 Home Layout: One level Home Equipment: Environmental consultant - 2 wheels;Bedside commode      Prior Function Level of Independence: Independent         Comments: was working on getting help with heavy housework and meals, but nothing set up yet, pt still drives     Hand Dominance        Extremity/Trunk Assessment               Lower Extremity Assessment: Overall WFL for tasks assessed         Communication   Communication: No difficulties  Cognition Arousal/Alertness: Awake/alert Behavior During Therapy: WFL for tasks assessed/performed Overall Cognitive Status: Within Functional Limits for tasks assessed                      General Comments      Exercises        Assessment/Plan    PT Assessment Patient needs continued PT services  PT Diagnosis Generalized weakness   PT Problem List Decreased strength;Decreased activity tolerance;Decreased balance;Decreased mobility;Decreased knowledge of use of DME  PT Treatment Interventions DME instruction;Balance training;Gait training;Functional mobility training;Patient/family education;Therapeutic activities;Therapeutic exercise  PT Goals (Current goals can be found in the Care Plan section) Acute Rehab PT Goals Patient Stated Goal: To go home PT Goal Formulation: With patient Time For Goal Achievement: 06/23/15 Potential to Achieve Goals: Good    Frequency Min 3X/week   Barriers to discharge        Co-evaluation               End of Session Equipment Utilized During Treatment: Gait belt Activity Tolerance: Patient tolerated treatment well Patient left: in chair            Time: 5208-0223 PT Time Calculation (min) (ACUTE ONLY): 34 min   Charges:   PT Evaluation $Initial PT Evaluation Tier I: 1 Procedure PT Treatments $Gait Training: 8-22 mins   PT G Codes:        Chrishawn Kring,CYNDI 2015-07-03, 10:33 AM  Magda Kiel, [place] 03-Jul-2015

## 2015-06-16 NOTE — Progress Notes (Signed)
Initial Nutrition Assessment  DOCUMENTATION CODES:   Not applicable  INTERVENTION:    Ensure Enlive po BID, each supplement provides 350 kcal and 20 grams of protein  NUTRITION DIAGNOSIS:   Inadequate oral intake related to acute illness, altered GI function as evidenced by per patient/family report.  GOAL:   Patient will meet greater than or equal to 90% of their needs  MONITOR:   PO intake, Supplement acceptance, Diet advancement  REASON FOR ASSESSMENT:   Malnutrition Screening Tool    ASSESSMENT:   Patient admitted on 8/7 with rectal bleeding, found to be from diverticulitis.  Patient reports that her doctor says she has lost ~14 lbs, but reports that her usual weight is ~146 lbs (2 lbs heavier than current weight). She says she has been eating poorly since admission. She agreed to try Ensure supplements. Nutrition-Focused physical exam completed. Findings are no fat depletion, mild-moderate muscle depletion, and no edema.   Diet Order:  Diet full liquid Room service appropriate?: Yes; Fluid consistency:: Thin  Skin:  Reviewed, no issues  Last BM:  8/9  Height:   Ht Readings from Last 1 Encounters:  06/14/15 5\' 4"  (1.626 m)    Weight:   Wt Readings from Last 1 Encounters:  06/16/15 144 lb 10 oz (65.6 kg)    Ideal Body Weight:  54.5 kg  BMI:  Body mass index is 24.81 kg/(m^2).  Estimated Nutritional Needs:   Kcal:  1400-1600  Protein:  70-90 gm  Fluid:  >/= 1.5 L  EDUCATION NEEDS:   No education needs identified at this time   Molli Barrows, [place], [place], [place] Pager [phone] After Hours Pager [phone]

## 2015-06-16 NOTE — Progress Notes (Addendum)
Transfer note:  Arrival Method: wheelchair from 3S Mental Orientation: A&OX4 Telemetry: Box [id number] CCMD notified Assessment: See flowsheet Skin: Dry; intact bruising on bilateral knees IV:  L A/C with NS @ 50 ml/hr Pain: denies Tubes: N/A Safety Measures: Bed in lowest position, call light within reach, bed alarm activated.  6700 Orientation: Patient has been oriented to the unit, staff and to the room.  Orders have been reviewed and implemented.  Richardson Chiquito

## 2015-06-16 NOTE — Progress Notes (Signed)
[facility] Progress Note  Subjective: Margaret Li feels well today. No further GI bleeding noted. Her nuclear medicine GI bleeding scan was negative.    Objective: Vital signs in last 24 hours: Temp:  [97.3 F (36.3 C)-99.3 F (37.4 C)] 97.3 F (36.3 C) (08/10 0800) Pulse Rate:  [71-123] 123 (08/10 1029) Resp:  [12-19] 14 (08/10 0440) BP: (110-185)/(66-98) 143/74 mmHg (08/10 0440) SpO2:  [98 %-100 %] 100 % (08/10 0440) Weight:  [65.6 kg (144 lb 10 oz)] 65.6 kg (144 lb 10 oz) (08/10 0437) Weight change: 1.4 kg (3 lb 1.4 oz)   PE:  No distress  Lab Results: Results for orders placed or performed during Margaret hospital encounter of 06/13/15 (from Margaret past 24 hour(s))  Hemoglobin and hematocrit, blood     Status: Abnormal   Collection Time: 06/15/15  6:52 PM  Result Value Ref Range   Hemoglobin 7.9 (L) 12.0 - 15.0 g/dL   HCT 23.8 (L) 36.0 - 46.0 %  Hemoglobin and hematocrit, blood     Status: Abnormal   Collection Time: 06/16/15  3:43 AM  Result Value Ref Range   Hemoglobin 7.2 (L) 12.0 - 15.0 g/dL   HCT 21.8 (L) 36.0 - 46.0 %    Studies/Results: Nm Gi Blood Loss  06/15/2015   CLINICAL DATA:  Active GI bleed.  EXAM: NUCLEAR MEDICINE GASTROINTESTINAL BLEEDING SCAN  TECHNIQUE: Sequential abdominal images were obtained following intravenous administration of Tc-43m labeled red blood cells.  RADIOPHARMACEUTICALS:  Twenty-five mCi Tc-25m in-vitro labeled red cells.  COMPARISON:  CT scan 06/14/2015  FINDINGS: No obvious active bleeding site is identified.  IMPRESSION: No obvious active bleeding site.   Electronically Signed   By: Marijo Sanes M.D.   On: 06/15/2015 18:21      Assessment: GI bleed secondary to diverticulosis. No signs of active bleeding on GI bleeding scan.  Plan:   Advance diet. Her hemoglobin and hematocrit have dropped, consider transfusion. Hopefully he can be discharged soon. I do not anticipate further GI intervention. We will sign off, call us  if needed.    SAM F Naphtali Zywicki 06/16/2015, 11:00 AM  Pager: [phone] If no answer or after 5 PM call [phone] Lab Results  Component Value Date   HGB 7.2* 06/16/2015   HGB 7.9* 06/15/2015   HGB 7.7* 06/15/2015   HCT 21.8* 06/16/2015   HCT 23.8* 06/15/2015   HCT 23.6* 06/15/2015   ALKPHOS 47 06/14/2015   ALKPHOS 50 06/13/2015   ALKPHOS 54 02/16/2010   AST 20 06/14/2015   AST 15 06/13/2015   AST 17 02/16/2010   ALT 9* 06/14/2015   ALT 10* 06/13/2015   ALT 11 02/16/2010

## 2015-06-17 DIAGNOSIS — D62 Acute posthemorrhagic anemia: Secondary | ICD-10-CM

## 2015-06-17 DIAGNOSIS — E039 Hypothyroidism, unspecified: Secondary | ICD-10-CM

## 2015-06-17 DIAGNOSIS — I10 Essential (primary) hypertension: Secondary | ICD-10-CM

## 2015-06-17 DIAGNOSIS — K625 Hemorrhage of anus and rectum: Secondary | ICD-10-CM

## 2015-06-17 LAB — BASIC METABOLIC PANEL
Anion gap: 8 (ref 5–15)
BUN: 10 mg/dL (ref 6–20)
CALCIUM: 8.3 mg/dL — AB (ref 8.9–10.3)
CO2: 24 mmol/L (ref 22–32)
Chloride: 111 mmol/L (ref 101–111)
Creatinine, Ser: 0.82 mg/dL (ref 0.44–1.00)
GFR calc Af Amer: >60 mL/min (ref >60)
GLUCOSE: 142 mg/dL — AB (ref 65–99)
Potassium: 3.6 mmol/L (ref 3.5–5.1)
SODIUM: 143 mmol/L (ref 135–145)

## 2015-06-17 LAB — CBC
HCT: 21.9 % — ABNORMAL LOW (ref 36.0–46.0)
HCT: 27.9 % — ABNORMAL LOW (ref 36.0–46.0)
HEMOGLOBIN: 7.3 g/dL — AB (ref 12.0–15.0)
HEMOGLOBIN: 9.5 g/dL — AB (ref 12.0–15.0)
MCH: 30.5 pg (ref 26.0–34.0)
MCH: 30.8 pg (ref 26.0–34.0)
MCHC: 33.3 g/dL (ref 30.0–36.0)
MCHC: 34.1 g/dL (ref 30.0–36.0)
MCV: 91.6 fL (ref 78.0–100.0)
MCV: 90.6 fL (ref 78.0–100.0)
Platelets: 135 10*3/uL — ABNORMAL LOW (ref 150–400)
Platelets: 169 10*3/uL (ref 150–400)
RBC: 2.39 MIL/uL — AB (ref 3.87–5.11)
RBC: 3.08 MIL/uL — AB (ref 3.87–5.11)
RDW: 15.2 % (ref 11.5–15.5)
RDW: 15.4 % (ref 11.5–15.5)
WBC: 4.4 10*3/uL (ref 4.0–10.5)
WBC: 6.2 10*3/uL (ref 4.0–10.5)

## 2015-06-17 LAB — PREPARE RBC (CROSSMATCH)

## 2015-06-17 MED ORDER — SODIUM CHLORIDE 0.9 % IV SOLN: Freq: Once | INTRAVENOUS | Status: AC

## 2015-06-17 MED ORDER — FUROSEMIDE 10 MG/ML IJ SOLN
20.0000 mg | Freq: Once | INTRAMUSCULAR | Status: AC
  Administered 2015-06-17: 20 mg via INTRAVENOUS
  Filled 2015-06-17: qty 2

## 2015-06-17 NOTE — Progress Notes (Signed)
[facility] PROGRESS NOTE  OPHA MCGHEE ZOX:[id number] DOB: 1929/09/27 DOA: 06/13/2015 PCP: Marjorie Smolder, MD  Assessment/Plan: 1. Acute blood loss anemia- patient came with rectal bleeding, likely diverticular. She does have history of diverticulosis. She had colonoscopy in the past which showed diverticulosis. Will transfuse one unit PRBC, will give lasix 20 mg IV x 1.Check cbc in am. 2. History of hypertension- Amlodipine, Cozaar on hold. 3. Hypothyroidism- continue synthroid.   Code Status: Full code Family Communication: Discussed with daughter at bedside Disposition Plan: *Home likely in am   Consultants:  GI  Procedures:  None  Antibiotics:    HPI/Subjective: 81 year old female with past medical history of hypertension and hypothyroidism on 8/7 which she described as almost pouring out of her. She became concerned and came to the emergency room where initial hemoglobin was at 11.2 but over the course of 6 hours had dropped to as low as 8.5. Renal function not really affected, but patient was noted to be borderline hypotensive. No abdominal pain or nausea/vomiting. Patient started on IV fluids. Currently she is doing somewhat better. CT scan of abdomen and pelvis unremarkable. By following morning, hemoglobin had dropped further to 7.7. Patient had large bloody bowel movement and blood pressure soft. Changed to nothing by mouth and tagged red blood cell scan done on 8/9 evening which was negative.  Patient's hemoglobin again dropped to 7.3 this morning, though no recent rectal bleed.   Objective: Filed Vitals:   06/17/15 1353  BP: 154/88  Pulse: 82  Temp: 99.1 F (37.3 C)  Resp: 18    Intake/Output Summary (Last 24 hours) at 06/17/15 1541 Last data filed at 06/17/15 1400  Gross per 24 hour  Intake 1929.33 ml  Output   1000 ml  Net 929.33 ml   Filed Weights   06/15/15 0350 06/16/15 0437 06/16/15 2018  Weight: 64.198 kg (141 lb 8.5 oz) 65.6 kg (144 lb  10 oz) 64 kg (141 lb 1.5 oz)    Exam:   General:  Appears in no acute distress  Cardiovascular: S1S2 RRR  Respiratory: Clear bilaterally  Abdomen: Soft, nontender  Musculoskeletal: No edema of the lower extremities  Data Reviewed: Basic Metabolic Panel:  Recent Labs Lab 06/13/15 2033 06/14/15 0230 06/15/15 0233 06/17/15 0626  NA 144 141 142 143  K 3.9 4.0 3.3* 3.6  CL 110 111 115* 111  CO2 28 25 23 24   GLUCOSE 109* 114* 95 142*  BUN 25* 22* 14 10  CREATININE 0.97 0.82 0.69 0.82  CALCIUM 8.8* 8.0* 8.1* 8.3*   Liver Function Tests:  Recent Labs Lab 06/13/15 2242 06/14/15 0230  AST 15 20  ALT 10* 9*  ALKPHOS 50 47  BILITOT 0.3 0.3  PROT 5.1* 4.7*  ALBUMIN 3.0* 2.7*   No results for input(s): LIPASE, AMYLASE in the last 168 hours. No results for input(s): AMMONIA in the last 168 hours. CBC:  Recent Labs Lab 06/13/15 2033 06/13/15 2242 06/13/15 2315 06/14/15 0230 06/14/15 0629  06/15/15 0233 06/15/15 1852 06/16/15 0343 06/16/15 1430 06/17/15 0626  WBC 6.7 6.7 7.6 6.0 5.1  --  4.2  --   --   --  4.4  NEUTROABS 4.7 4.6 5.4 3.8 3.5  --   --   --   --   --   --   HGB 11.2* 9.7* 10.3* 8.5* 8.8*  < > 7.7* 7.9* 7.2* 9.0* 7.3*  HCT 34.0* 30.0* 32.0* 26.5* 27.0*  < > 23.6* 23.8* 21.8* 26.5* 21.9*  MCV 94.2 94.6 94.4 94.6 91.5  --  91.1  --   --   --  91.6  PLT 168 153 166 143* 126*  --  129*  --   --   --  135*  < > = values in this interval not displayed. Cardiac Enzymes: No results for input(s): CKTOTAL, CKMB, CKMBINDEX, TROPONINI in the last 168 hours. BNP (last 3 results) No results for input(s): BNP in the last 8760 hours.  ProBNP (last 3 results) No results for input(s): PROBNP in the last 8760 hours.  CBG: No results for input(s): GLUCAP in the last 168 hours.  Recent Results (from the past 240 hour(s))  MRSA PCR Screening     Status: Abnormal   Collection Time: 06/14/15  2:22 PM  Result Value Ref Range Status   MRSA by PCR POSITIVE (A)  NEGATIVE Final    Comment:        The GeneXpert MRSA Assay (FDA approved for NASAL specimens only), is one component of a comprehensive MRSA colonization surveillance program. It is not intended to diagnose MRSA infection nor to guide or monitor treatment for MRSA infections. RESULT CALLED TO, READ BACK BY AND VERIFIED WITH: Bethel Born RN 16:20 06/14/15 ([email or web address])      Studies: Nm Gi Blood Loss  06/15/2015   CLINICAL DATA:  Active GI bleed.  EXAM: NUCLEAR MEDICINE GASTROINTESTINAL BLEEDING SCAN  TECHNIQUE: Sequential abdominal images were obtained following intravenous administration of Tc-69m labeled red blood cells.  RADIOPHARMACEUTICALS:  Twenty-five mCi Tc-85m in-vitro labeled red cells.  COMPARISON:  CT scan 06/14/2015  FINDINGS: No obvious active bleeding site is identified.  IMPRESSION: No obvious active bleeding site.   Electronically Signed   By: Marijo Sanes M.D.   On: 06/15/2015 18:21    Scheduled Meds: . Chlorhexidine Gluconate Cloth  6 each Topical Q0600  . dorzolamide  1 drop Both Eyes BID  . feeding supplement (ENSURE ENLIVE)  237 mL Oral BID BM  . furosemide  20 mg Intravenous Once  . latanoprost  1 drop Both Eyes QHS  . levothyroxine  25 mcg Oral QAC breakfast  . mirtazapine  30 mg Oral QHS  . mupirocin ointment  1 application Nasal BID  . pantoprazole  40 mg Oral BID  . simvastatin  5 mg Oral q1800  . sodium chloride  3 mL Intravenous Q12H   Continuous Infusions: . sodium chloride 10 mL/hr at 06/17/15 0844    Principal Problem:   Acute blood loss anemia Active Problems:   Rectal bleeding   Essential hypertension   Hypothyroidism   Glaucoma    Time spent: 25 min    [facility] Pager [phone]. If 7PM-7AM, please contact night-coverage at [email or web address], password [facility] 06/17/2015, 3:41 PM  LOS: 4 days

## 2015-06-17 NOTE — Care Management Note (Addendum)
Case Management Note  Patient Details  Name: Margaret Li MRN: [id number] Date of Birth: 1929-07-27  Subjective/Objective:        Anemia            Action/Plan:  Home Health  Expected Discharge Date:  06/18/2015              Expected Discharge Plan:  [name]  In-House Referral:  SW Consult  Discharge planning Services  CM Consult  Post Acute Care Choice:  Home Health Choice offered to:  Adult Children   HH Arranged:  PT HH Agency:  Other - See comment  Status of Service:  Completed, signed off  Medicare Important Message Given:  Yes-second notification given Date Medicare IM Given:    Medicare IM give by:    Date Additional Medicare IM Given:    Additional Medicare Important Message give by:     If discussed at [place] of Stay Meetings, dates discussed:    Additional Comments: NCM spoke to pt and gave permission to speak to Dawayne Cirri # [phone]. Offered choice for [facility]. Dtr requested [facility] for [facility] Contacted [employer] rep for new referral for [facility]. Pt states she has RW and 3n1 at home from previous surgery. Dtr states she will be in town for a week to assist pt and has hired a Engineer, production to come in during the day. She is working out the hours with the agency. Pt lives at home and was independent prior to admission. Dtr is requesting discharge summary faxed to Dr. Anthoney Harada office # [phone]. Dtr would like to speak to CSW for support services for pt in the home. Referral made to CSW for scheduled dc home on 06/18/2015. Erenest Rasher, RN 06/17/2015, 4:20 PM

## 2015-06-17 NOTE — Plan of Care (Signed)
Problem: Phase I Progression Outcomes Goal: Hemodynamically stable Outcome: Progressing Hgb 7.3.  Problem: Phase II Progression Outcomes Goal: No active bleeding Outcome: Progressing No bleeding noted, but pt's hgb still dropping.

## 2015-06-18 LAB — BASIC METABOLIC PANEL
Anion gap: 7 (ref 5–15)
BUN: 10 mg/dL (ref 6–20)
CALCIUM: 8.5 mg/dL — AB (ref 8.9–10.3)
CO2: 29 mmol/L (ref 22–32)
CREATININE: 0.96 mg/dL (ref 0.44–1.00)
Chloride: 108 mmol/L (ref 101–111)
GFR, EST NON AFRICAN AMERICAN: 53 mL/min — AB (ref >60)
Glucose, Bld: 118 mg/dL — ABNORMAL HIGH (ref 65–99)
Potassium: 3.2 mmol/L — ABNORMAL LOW (ref 3.5–5.1)
Sodium: 144 mmol/L (ref 135–145)

## 2015-06-18 LAB — CBC
HEMATOCRIT: 24.9 % — AB (ref 36.0–46.0)
Hemoglobin: 8.3 g/dL — ABNORMAL LOW (ref 12.0–15.0)
MCH: 30 pg (ref 26.0–34.0)
MCHC: 33.3 g/dL (ref 30.0–36.0)
MCV: 89.9 fL (ref 78.0–100.0)
Platelets: 165 10*3/uL (ref 150–400)
RBC: 2.77 MIL/uL — ABNORMAL LOW (ref 3.87–5.11)
RDW: 15.7 % — ABNORMAL HIGH (ref 11.5–15.5)
WBC: 5.7 10*3/uL (ref 4.0–10.5)

## 2015-06-18 LAB — TYPE AND SCREEN
ABO/RH(D): O POS
ANTIBODY SCREEN: NEGATIVE
Unit division: 0

## 2015-06-18 MED ORDER — POTASSIUM CHLORIDE CRYS ER 20 MEQ PO TBCR: 20.0000 meq | EXTENDED_RELEASE_TABLET | Freq: Every day | ORAL | Status: DC

## 2015-06-18 MED ORDER — POTASSIUM CHLORIDE CRYS ER 20 MEQ PO TBCR: 20.0000 meq | EXTENDED_RELEASE_TABLET | Freq: Two times a day (BID) | ORAL | Status: DC

## 2015-06-18 MED ORDER — PANTOPRAZOLE SODIUM 40 MG PO TBEC: 40.0000 mg | DELAYED_RELEASE_TABLET | Freq: Every day | ORAL | Status: DC

## 2015-06-18 MED ORDER — WHITE PETROLATUM GEL
Status: AC
  Administered 2015-06-18: 0.2
  Filled 2015-06-18: qty 1

## 2015-06-18 MED ORDER — POTASSIUM CHLORIDE CRYS ER 20 MEQ PO TBCR
40.0000 meq | EXTENDED_RELEASE_TABLET | Freq: Once | ORAL | Status: AC
  Administered 2015-06-18: 40 meq via ORAL
  Filled 2015-06-18: qty 2

## 2015-06-18 NOTE — Progress Notes (Deleted)
Physical Therapy Treatment Patient Details Name: Margaret Li MRN: [id number] DOB: January 11, 1929 Today's Date: 06/18/2015    History of Present Illness Margaret Li is a 81 y.o. female with Past medical history of essential hypertension, hypothyroidism, dyslipidemia, vascular disease.  Admitted with lower GI bleed after falling in the bathroom and hitting bathtub.    PT Comments    Pt is eager to DC home today. She is progressing well with mobility. She walked 220' with IV pole for support. Encouraged pt to use SPC or RW at home. Instructed pt in BLE exercises in sitting.   Follow Up Recommendations  Home health PT     Equipment Recommendations  None recommended by PT    Recommendations for Other Services       Precautions / Restrictions Precautions Precautions: Fall Restrictions Weight Bearing Restrictions: No    Mobility  Bed Mobility Overal bed mobility: Modified Independent             General bed mobility comments: HOB up 30*, used rail  Transfers Overall transfer level: Needs assistance Equipment used: None Transfers: Sit to/from Stand Sit to Stand: Supervision         General transfer comment: cues for hand placement  Ambulation/Gait Ambulation/Gait assistance: Supervision Ambulation Distance (Feet): 220 Feet Assistive device:  (IV pole) Gait Pattern/deviations: Step-through pattern Gait velocity: WFL   General Gait Details: steady, no LOB, overall increased distance, held IV pole with RUE   Stairs            Wheelchair Mobility    Modified Rankin (Stroke Patients Only)       Balance             Standing balance-Leahy Scale: Fair Standing balance comment: helpful to have singe UE support for dynamic standing balance                    Cognition Arousal/Alertness: Awake/alert Behavior During Therapy: WFL for tasks assessed/performed Overall Cognitive Status: Within Functional Limits for tasks assessed                       Exercises General Exercises - Lower Extremity Ankle Circles/Pumps: AROM;10 reps;Seated Long Arc Quad: AROM;10 reps;Seated Hip Flexion/Marching: AROM;10 reps;Seated    General Comments        Pertinent Vitals/Pain Pain Assessment: No/denies pain    Home Living                      Prior Function            PT Goals (current goals can now be found in the care plan section)      Frequency  Min 3X/week    PT Plan Current plan remains appropriate    Co-evaluation             End of Session Equipment Utilized During Treatment: Gait belt Activity Tolerance: Patient tolerated treatment well Patient left: in chair;with family/visitor present     Time: 5597-4163 PT Time Calculation (min) (ACUTE ONLY): 26 min  Charges:  $Gait Training: 8-22 mins $Therapeutic Exercise: 8-22 mins                    G Codes:      Philomena Doheny 06/18/2015, 10:36 AM [phone]

## 2015-06-18 NOTE — Discharge Summary (Signed)
Physician Discharge Summary  Margaret Li JAS:[id number] DOB: Feb 09, 1929 DOA: 06/13/2015  PCP: Marjorie Smolder, MD  Admit date: 06/13/2015 Discharge date: 06/18/2015  Time spent: 25* minutes  Recommendations for Outpatient Follow-up:  Follow up PCP in one week to check CBC, BMP  Discharge Diagnoses:  Principal Problem:   Acute blood loss anemia Active Problems:   Rectal bleeding   Essential hypertension   Hypothyroidism   Glaucoma   Discharge Condition: Stable  Diet recommendation: Soft diet  Filed Weights   06/16/15 0437 06/16/15 2018 06/17/15 2100  Weight: 65.6 kg (144 lb 10 oz) 64 kg (141 lb 1.5 oz) 64.4 kg (141 lb 15.6 oz)    History of present illness:  81 year old female with past medical history of hypertension and hypothyroidism on 8/7 which she described as almost pouring out of her. She became concerned and came to the emergency room where initial hemoglobin was at 11.2 but over the course of 6 hours had dropped to as low as 8.5. Renal function not really affected, but patient was noted to be borderline hypotensive. No abdominal pain or nausea/vomiting. Patient started on IV fluids. Currently she is doing somewhat better. CT scan of abdomen and pelvis unremarkable. By following morning, hemoglobin had dropped further to 7.7. Patient had large bloody bowel movement and blood pressure soft. Changed to nothing by mouth and tagged red blood cell scan done on 8/9 evening which was negative.     Hospital Course:  1. Acute blood loss anemia- patient came with rectal bleeding, likely diverticular. She does have history of diverticulosis. She had colonoscopy in the past which showed diverticulosis. Patient was transfused one unit PRBC yesterday, Hb this morning is 8.3. I called and discussed with Dr Penelope Coop who recommends to follow with PCP in one week to check cbc as outpatient. 2. History of hypertension- Amlodipine, Cozaar  3. Hypothyroidism- continue synthroid. 4. Hypokalemia-  replace potassium 40 meq pox 1 , and send home on Kdur 20 meq po daily x 3 days. Will need to check bmp in one week  Procedures:  Nuclear bleeding scan- negative  Consultations:  GI  Discharge Exam: Filed Vitals:   06/18/15 0700  BP: 159/77  Pulse: 81  Temp: 98.5 F (36.9 C)  Resp: 18    General: Appears in no acute distress Cardiovascular: S1S2 RRR Respiratory: Clear bilaterally  Discharge Instructions   Discharge Instructions    Diet - low sodium heart healthy    Complete by:  As directed      Discharge instructions    Complete by:  As directed   Follow PCP next week to check CBC     Increase activity slowly    Complete by:  As directed           Current Discharge Medication List    START taking these medications   Details  pantoprazole (PROTONIX) 40 MG tablet Take 1 tablet (40 mg total) by mouth daily. Qty: 30 tablet, Refills: 2      CONTINUE these medications which have NOT CHANGED   Details  amLODipine (NORVASC) 5 MG tablet Take 5 mg by mouth daily.    aspirin EC 81 MG tablet Take 81 mg by mouth at bedtime.    bimatoprost (LUMIGAN) 0.03 % ophthalmic solution Place 1 drop into both eyes at bedtime.    dorzolamide (TRUSOPT) 2 % ophthalmic solution Place 1 drop into both eyes 2 (two) times daily.    LORazepam (ATIVAN) 2 MG tablet Take 2 mg by  mouth 2 (two) times daily as needed for anxiety.     losartan (COZAAR) 50 MG tablet Take 50 mg by mouth 2 (two) times daily.    Lutein 20 MG TABS Take 20 mg by mouth daily.    mirtazapine (REMERON) 30 MG tablet Take 30 mg by mouth at bedtime. Refills: 0    simvastatin (ZOCOR) 10 MG tablet Take 5 mg by mouth daily at 6 PM.     SYNTHROID 25 MCG tablet Take 25 mcg by mouth daily.      STOP taking these medications     naproxen sodium (ANAPROX) 220 MG tablet        Allergies  Allergen Reactions  . Sulfa Antibiotics     "Long time ago"   Follow-up Information    Follow up with [facility].    Why:  Home Health Physical Therapy   Contact information:   [phone]      Follow up with Marjorie Smolder, MD In 1 week.   Specialty:  Family Medicine   Why:  Follow PCP next week to check CBC   Contact information:   [place] [address] [phone]        The results of significant diagnostics from this hospitalization (including imaging, microbiology, ancillary and laboratory) are listed below for reference.    Significant Diagnostic Studies: Ct Head Wo Contrast  06/14/2015   CLINICAL DATA:  Status post fall. Struck face on bathtub. Concern for head injury. Initial encounter.  EXAM: CT HEAD WITHOUT CONTRAST  TECHNIQUE: Contiguous axial images were obtained from the base of the skull through the vertex without intravenous contrast.  COMPARISON:  CT of the head performed 01/20/2009, and MRI of the brain performed 03/09/2009  FINDINGS: There is no evidence of acute infarction, mass lesion, or intra- or extra-axial hemorrhage on CT.  Prominence of the ventricles and sulci reflects cysts mild cortical volume loss. Mild cerebellar atrophy is noted. Scattered periventricular and subcortical white matter change likely reflects small vessel ischemic microangiopathy.  The brainstem and fourth ventricle are within normal limits. The basal ganglia are unremarkable in appearance. The cerebral hemispheres demonstrate grossly normal gray-white differentiation. No mass effect or midline shift is seen.  Slight deformity of the right side of the nasal bone is of uncertain chronicity. The orbits are within normal limits. The paranasal sinuses and mastoid air cells are well-aerated. No significant soft tissue abnormalities are seen.  IMPRESSION: 1. No evidence of traumatic intracranial injury. 2. Mild cortical volume loss and scattered small vessel ischemic microangiopathy. 3. Slight deformity of the right side of the nasal bone is of uncertain chronicity. Would correlate  for associated symptoms.   Electronically Signed   By: Garald Balding M.D.   On: 06/14/2015 04:04   Nm Gi Blood Loss  06/15/2015   CLINICAL DATA:  Active GI bleed.  EXAM: NUCLEAR MEDICINE GASTROINTESTINAL BLEEDING SCAN  TECHNIQUE: Sequential abdominal images were obtained following intravenous administration of Tc-54m labeled red blood cells.  RADIOPHARMACEUTICALS:  Twenty-five mCi Tc-25m in-vitro labeled red cells.  COMPARISON:  CT scan 06/14/2015  FINDINGS: No obvious active bleeding site is identified.  IMPRESSION: No obvious active bleeding site.   Electronically Signed   By: Marijo Sanes M.D.   On: 06/15/2015 18:21   Ct Abdomen Pelvis W Contrast  06/14/2015   CLINICAL DATA:  Acute onset of rectal or vaginal bleeding. Right lower quadrant tenderness. Recent fall. Initial encounter.  EXAM: CT ABDOMEN AND PELVIS  WITH CONTRAST  TECHNIQUE: Multidetector CT imaging of the abdomen and pelvis was performed using the standard protocol following bolus administration of intravenous contrast.  CONTRAST:  100 mL of Omnipaque 300 IV contrast  COMPARISON:  Pelvic radiograph performed 02/23/2010, and CT of the abdomen and pelvis performed 03/31/2004  FINDINGS: The visualized lung bases are clear. Bilateral breast implants are partially imaged. Scattered coronary artery calcifications are seen.  The liver and spleen are unremarkable in appearance. The gallbladder is within normal limits. The pancreas and adrenal glands are unremarkable.  Scattered bilateral renal cysts are seen, measuring up to 5.9 cm in size. A nonobstructing 3 mm stone is noted at the lower pole of the right kidney. Mild scarring is noted at the lower pole of the right kidney. There is no evidence of hydronephrosis. No obstructing ureteral stones are seen.  No free fluid is identified. The small bowel is unremarkable in appearance. The stomach is within normal limits. No acute vascular abnormalities are seen. Scattered calcification is noted along the  abdominal aorta and its branches.  The appendix is normal in caliber, without evidence of appendicitis. Diffuse diverticulosis is noted along the entirety of the colon, without evidence for acute diverticulitis.  The bladder is mildly distended and grossly unremarkable. The patient is status post hysterectomy. No suspicious adnexal masses are seen. No inguinal lymphadenopathy is seen.  No acute osseous abnormalities are identified. The patient's right hip arthroplasty is grossly unremarkable in appearance, though incompletely imaged. Multilevel vacuum phenomenon and sclerotic change are seen along the lumbar spine.  IMPRESSION: 1. No acute abnormality seen to explain the patient's symptoms. 2. Extensive diffuse diverticulosis along the entirety of the colon, without definite evidence of acute diverticulitis. 3. Scattered bilateral renal cysts. Nonobstructing 3 mm stone at the lower pole of the right kidney. Mild right renal scarring noted. 4. Scattered calcification along the abdominal aorta and its branches. 5. Scattered coronary artery calcifications seen. 6. Mild diffuse degenerative change along the lumbar spine.   Electronically Signed   By: Garald Balding M.D.   On: 06/14/2015 04:21    Microbiology: Recent Results (from the past 240 hour(s))  MRSA PCR Screening     Status: Abnormal   Collection Time: 06/14/15  2:22 PM  Result Value Ref Range Status   MRSA by PCR POSITIVE (A) NEGATIVE Final    Comment:        The GeneXpert MRSA Assay (FDA approved for NASAL specimens only), is one component of a comprehensive MRSA colonization surveillance program. It is not intended to diagnose MRSA infection nor to guide or monitor treatment for MRSA infections. RESULT CALLED TO, READ BACK BY AND VERIFIED WITH: Bethel Born RN 16:20 06/14/15 ([email or web address])      Labs: Basic Metabolic Panel:  Recent Labs Lab 06/13/15 2033 06/14/15 0230 06/15/15 0233 06/17/15 0626 06/18/15 0355  NA 144 141 142 143 144   K 3.9 4.0 3.3* 3.6 3.2*  CL 110 111 115* 111 108  CO2 28 25 23 24 29   GLUCOSE 109* 114* 95 142* 118*  BUN 25* 22* 14 10 10   CREATININE 0.97 0.82 0.69 0.82 0.96  CALCIUM 8.8* 8.0* 8.1* 8.3* 8.5*   Liver Function Tests:  Recent Labs Lab 06/13/15 2242 06/14/15 0230  AST 15 20  ALT 10* 9*  ALKPHOS 50 47  BILITOT 0.3 0.3  PROT 5.1* 4.7*  ALBUMIN 3.0* 2.7*   No results for input(s): LIPASE, AMYLASE in the last 168 hours. No results for input(s): AMMONIA in  the last 168 hours. CBC:  Recent Labs Lab 06/13/15 2033 06/13/15 2242 06/13/15 2315 06/14/15 0230 06/14/15 0629  06/15/15 0233  06/16/15 0343 06/16/15 1430 06/17/15 0626 06/17/15 1927 06/18/15 0355  WBC 6.7 6.7 7.6 6.0 5.1  --  4.2  --   --   --  4.4 6.2 5.7  NEUTROABS 4.7 4.6 5.4 3.8 3.5  --   --   --   --   --   --   --   --   HGB 11.2* 9.7* 10.3* 8.5* 8.8*  < > 7.7*  < > 7.2* 9.0* 7.3* 9.5* 8.3*  HCT 34.0* 30.0* 32.0* 26.5* 27.0*  < > 23.6*  < > 21.8* 26.5* 21.9* 27.9* 24.9*  MCV 94.2 94.6 94.4 94.6 91.5  --  91.1  --   --   --  91.6 90.6 89.9  PLT 168 153 166 143* 126*  --  129*  --   --   --  135* 169 165  < > = values in this interval not displayed. Cardiac Enzymes: No results for input(s): CKTOTAL, CKMB, CKMBINDEX, TROPONINI in the last 168 hours. BNP: BNP (last 3 results) No results for input(s): BNP in the last 8760 hours.  ProBNP (last 3 results) No results for input(s): PROBNP in the last 8760 hours.  CBG: No results for input(s): GLUCAP in the last 168 hours.     SignedEleonore Chiquito S  [facility] 06/18/2015, 10:31 AM

## 2015-06-18 NOTE — Progress Notes (Signed)
Patient Discharge:  Disposition: Pt discharge home with Home Health PT  Education: Pt and daughter educated on medications, follow up appointments and all discharge instructions. Pt and daughter verbalized understanding.   IV: Removed  Telemetry: Removed. CCMD notified  Follow-up appointments: Reviewed with pt.  Prescriptions: Sent to pt's pharmacy  Transportation: Transported home via car by daughter  Belongings: All belongings taken with pt.

## 2015-06-18 NOTE — Progress Notes (Signed)
Physical Therapy Treatment Patient Details Name: MARJARIE IRION MRN: [id number] DOB: 07/26/1929 Today's Date: 06/18/2015    History of Present Illness ALIESE BRANNUM is a 81 y.o. female with Past medical history of essential hypertension, hypothyroidism, dyslipidemia, vascular disease.  Admitted with lower GI bleed after falling in the bathroom and hitting bathtub.    PT Comments    Pt is progressing well with mobility. She walked 220' with IV pole without loss of balance. Instructed pt in BLE exercises in sitting. Encouraged use of RW or SPC at home.   Follow Up Recommendations  Home health PT     Equipment Recommendations  None recommended by PT    Recommendations for Other Services       Precautions / Restrictions Precautions Precautions: Fall Restrictions Weight Bearing Restrictions: No    Mobility  Bed Mobility Overal bed mobility: Modified Independent             General bed mobility comments: HOB up 30*, used rail  Transfers Overall transfer level: Needs assistance Equipment used: None Transfers: Sit to/from Stand Sit to Stand: Supervision         General transfer comment: cues for hand placement  Ambulation/Gait Ambulation/Gait assistance: Supervision Ambulation Distance (Feet): 220 Feet Assistive device:  (IV pole) Gait Pattern/deviations: Step-through pattern Gait velocity: WFL   General Gait Details: steady, no LOB, overall increased distance, held IV pole with RUE   Stairs            Wheelchair Mobility    Modified Rankin (Stroke Patients Only)       Balance             Standing balance-Leahy Scale: Fair Standing balance comment: helpful to have singe UE support for dynamic standing balance                    Cognition Arousal/Alertness: Awake/alert Behavior During Therapy: WFL for tasks assessed/performed Overall Cognitive Status: Within Functional Limits for tasks assessed                       Exercises General Exercises - Lower Extremity Ankle Circles/Pumps: AROM;10 reps;Seated Long Arc Quad: AROM;10 reps;Seated Hip Flexion/Marching: AROM;10 reps;Seated    General Comments        Pertinent Vitals/Pain Pain Assessment: No/denies pain    Home Living                      Prior Function            PT Goals (current goals can now be found in the care plan section) Acute Rehab PT Goals Patient Stated Goal: To go home, exercise at [employer] PT Goal Formulation: With patient/family Time For Goal Achievement: 06/23/15 Potential to Achieve Goals: Good Progress towards PT goals: Progressing toward goals    Frequency  Min 3X/week    PT Plan Current plan remains appropriate    Co-evaluation             End of Session Equipment Utilized During Treatment: Gait belt Activity Tolerance: Patient tolerated treatment well Patient left: in chair;with family/visitor present     Time: 1002-1028 PT Time Calculation (min) (ACUTE ONLY): 26 min  Charges:  $Gait Training: 8-22 mins $Therapeutic Exercise: 8-22 mins                    G Codes:      Philomena Doheny 06/18/2015, 10:38 AM [phone]

## 2015-06-22 DIAGNOSIS — R2689 Other abnormalities of gait and mobility: Secondary | ICD-10-CM | POA: Diagnosis not present

## 2015-06-22 DIAGNOSIS — H409 Unspecified glaucoma: Secondary | ICD-10-CM | POA: Diagnosis not present

## 2015-06-22 DIAGNOSIS — M6281 Muscle weakness (generalized): Secondary | ICD-10-CM | POA: Diagnosis not present

## 2015-06-22 DIAGNOSIS — R32 Unspecified urinary incontinence: Secondary | ICD-10-CM | POA: Diagnosis not present

## 2015-06-22 DIAGNOSIS — E785 Hyperlipidemia, unspecified: Secondary | ICD-10-CM | POA: Diagnosis not present

## 2015-06-22 DIAGNOSIS — I10 Essential (primary) hypertension: Secondary | ICD-10-CM | POA: Diagnosis not present

## 2015-06-22 DIAGNOSIS — E039 Hypothyroidism, unspecified: Secondary | ICD-10-CM | POA: Diagnosis not present

## 2015-06-24 DIAGNOSIS — K922 Gastrointestinal hemorrhage, unspecified: Secondary | ICD-10-CM | POA: Diagnosis not present

## 2015-06-24 DIAGNOSIS — E876 Hypokalemia: Secondary | ICD-10-CM | POA: Diagnosis not present

## 2015-06-24 DIAGNOSIS — D62 Acute posthemorrhagic anemia: Secondary | ICD-10-CM | POA: Diagnosis not present

## 2015-06-24 DIAGNOSIS — I10 Essential (primary) hypertension: Secondary | ICD-10-CM | POA: Diagnosis not present

## 2015-06-25 DIAGNOSIS — K922 Gastrointestinal hemorrhage, unspecified: Secondary | ICD-10-CM | POA: Diagnosis not present

## 2015-06-28 DIAGNOSIS — H409 Unspecified glaucoma: Secondary | ICD-10-CM | POA: Diagnosis not present

## 2015-06-28 DIAGNOSIS — M6281 Muscle weakness (generalized): Secondary | ICD-10-CM | POA: Diagnosis not present

## 2015-06-28 DIAGNOSIS — R32 Unspecified urinary incontinence: Secondary | ICD-10-CM | POA: Diagnosis not present

## 2015-06-28 DIAGNOSIS — R2689 Other abnormalities of gait and mobility: Secondary | ICD-10-CM | POA: Diagnosis not present

## 2015-06-28 DIAGNOSIS — I10 Essential (primary) hypertension: Secondary | ICD-10-CM | POA: Diagnosis not present

## 2015-06-28 DIAGNOSIS — E039 Hypothyroidism, unspecified: Secondary | ICD-10-CM | POA: Diagnosis not present

## 2015-06-30 DIAGNOSIS — H409 Unspecified glaucoma: Secondary | ICD-10-CM | POA: Diagnosis not present

## 2015-06-30 DIAGNOSIS — R32 Unspecified urinary incontinence: Secondary | ICD-10-CM | POA: Diagnosis not present

## 2015-06-30 DIAGNOSIS — E039 Hypothyroidism, unspecified: Secondary | ICD-10-CM | POA: Diagnosis not present

## 2015-06-30 DIAGNOSIS — I10 Essential (primary) hypertension: Secondary | ICD-10-CM | POA: Diagnosis not present

## 2015-06-30 DIAGNOSIS — R2689 Other abnormalities of gait and mobility: Secondary | ICD-10-CM | POA: Diagnosis not present

## 2015-06-30 DIAGNOSIS — M6281 Muscle weakness (generalized): Secondary | ICD-10-CM | POA: Diagnosis not present

## 2015-07-01 DIAGNOSIS — F411 Generalized anxiety disorder: Secondary | ICD-10-CM | POA: Diagnosis not present

## 2015-07-01 DIAGNOSIS — F332 Major depressive disorder, recurrent severe without psychotic features: Secondary | ICD-10-CM | POA: Diagnosis not present

## 2015-07-01 DIAGNOSIS — F341 Dysthymic disorder: Secondary | ICD-10-CM | POA: Diagnosis not present

## 2015-07-01 DIAGNOSIS — D649 Anemia, unspecified: Secondary | ICD-10-CM | POA: Diagnosis not present

## 2015-07-01 DIAGNOSIS — F419 Anxiety disorder, unspecified: Secondary | ICD-10-CM | POA: Diagnosis not present

## 2015-07-01 DIAGNOSIS — I10 Essential (primary) hypertension: Secondary | ICD-10-CM | POA: Diagnosis not present

## 2015-07-01 DIAGNOSIS — Z0000 Encounter for general adult medical examination without abnormal findings: Secondary | ICD-10-CM | POA: Diagnosis not present

## 2015-07-01 DIAGNOSIS — I700 Atherosclerosis of aorta: Secondary | ICD-10-CM | POA: Diagnosis not present

## 2015-07-01 DIAGNOSIS — M810 Age-related osteoporosis without current pathological fracture: Secondary | ICD-10-CM | POA: Diagnosis not present

## 2015-07-01 DIAGNOSIS — E039 Hypothyroidism, unspecified: Secondary | ICD-10-CM | POA: Diagnosis not present

## 2015-07-02 DIAGNOSIS — E039 Hypothyroidism, unspecified: Secondary | ICD-10-CM | POA: Diagnosis not present

## 2015-07-02 DIAGNOSIS — M6281 Muscle weakness (generalized): Secondary | ICD-10-CM | POA: Diagnosis not present

## 2015-07-02 DIAGNOSIS — H409 Unspecified glaucoma: Secondary | ICD-10-CM | POA: Diagnosis not present

## 2015-07-02 DIAGNOSIS — R32 Unspecified urinary incontinence: Secondary | ICD-10-CM | POA: Diagnosis not present

## 2015-07-02 DIAGNOSIS — I10 Essential (primary) hypertension: Secondary | ICD-10-CM | POA: Diagnosis not present

## 2015-07-02 DIAGNOSIS — R2689 Other abnormalities of gait and mobility: Secondary | ICD-10-CM | POA: Diagnosis not present

## 2015-07-05 DIAGNOSIS — H409 Unspecified glaucoma: Secondary | ICD-10-CM | POA: Diagnosis not present

## 2015-07-05 DIAGNOSIS — I10 Essential (primary) hypertension: Secondary | ICD-10-CM | POA: Diagnosis not present

## 2015-07-05 DIAGNOSIS — R32 Unspecified urinary incontinence: Secondary | ICD-10-CM | POA: Diagnosis not present

## 2015-07-05 DIAGNOSIS — R2689 Other abnormalities of gait and mobility: Secondary | ICD-10-CM | POA: Diagnosis not present

## 2015-07-05 DIAGNOSIS — M6281 Muscle weakness (generalized): Secondary | ICD-10-CM | POA: Diagnosis not present

## 2015-07-05 DIAGNOSIS — E039 Hypothyroidism, unspecified: Secondary | ICD-10-CM | POA: Diagnosis not present

## 2015-07-07 DIAGNOSIS — H4011X3 Primary open-angle glaucoma, severe stage: Secondary | ICD-10-CM | POA: Diagnosis not present

## 2015-07-07 DIAGNOSIS — H401430 Capsular glaucoma with pseudoexfoliation of lens, bilateral, stage unspecified: Secondary | ICD-10-CM | POA: Diagnosis not present

## 2015-07-07 DIAGNOSIS — H04123 Dry eye syndrome of bilateral lacrimal glands: Secondary | ICD-10-CM | POA: Diagnosis not present

## 2015-07-08 DIAGNOSIS — I10 Essential (primary) hypertension: Secondary | ICD-10-CM | POA: Diagnosis not present

## 2015-07-08 DIAGNOSIS — R2689 Other abnormalities of gait and mobility: Secondary | ICD-10-CM | POA: Diagnosis not present

## 2015-07-08 DIAGNOSIS — E039 Hypothyroidism, unspecified: Secondary | ICD-10-CM | POA: Diagnosis not present

## 2015-07-08 DIAGNOSIS — M6281 Muscle weakness (generalized): Secondary | ICD-10-CM | POA: Diagnosis not present

## 2015-07-08 DIAGNOSIS — R32 Unspecified urinary incontinence: Secondary | ICD-10-CM | POA: Diagnosis not present

## 2015-07-08 DIAGNOSIS — H409 Unspecified glaucoma: Secondary | ICD-10-CM | POA: Diagnosis not present

## 2015-07-13 DIAGNOSIS — R32 Unspecified urinary incontinence: Secondary | ICD-10-CM | POA: Diagnosis not present

## 2015-07-13 DIAGNOSIS — M6281 Muscle weakness (generalized): Secondary | ICD-10-CM | POA: Diagnosis not present

## 2015-07-13 DIAGNOSIS — E039 Hypothyroidism, unspecified: Secondary | ICD-10-CM | POA: Diagnosis not present

## 2015-07-13 DIAGNOSIS — I10 Essential (primary) hypertension: Secondary | ICD-10-CM | POA: Diagnosis not present

## 2015-07-13 DIAGNOSIS — R2689 Other abnormalities of gait and mobility: Secondary | ICD-10-CM | POA: Diagnosis not present

## 2015-07-13 DIAGNOSIS — H409 Unspecified glaucoma: Secondary | ICD-10-CM | POA: Diagnosis not present

## 2015-07-14 DIAGNOSIS — F332 Major depressive disorder, recurrent severe without psychotic features: Secondary | ICD-10-CM | POA: Diagnosis not present

## 2015-07-14 DIAGNOSIS — D62 Acute posthemorrhagic anemia: Secondary | ICD-10-CM | POA: Diagnosis not present

## 2015-07-14 DIAGNOSIS — I10 Essential (primary) hypertension: Secondary | ICD-10-CM | POA: Diagnosis not present

## 2015-07-22 DIAGNOSIS — N898 Other specified noninflammatory disorders of vagina: Secondary | ICD-10-CM | POA: Diagnosis not present

## 2015-07-22 DIAGNOSIS — M545 Low back pain: Secondary | ICD-10-CM | POA: Diagnosis not present

## 2015-07-22 DIAGNOSIS — N390 Urinary tract infection, site not specified: Secondary | ICD-10-CM | POA: Diagnosis not present

## 2015-08-05 DIAGNOSIS — N390 Urinary tract infection, site not specified: Secondary | ICD-10-CM | POA: Diagnosis not present

## 2015-08-23 DIAGNOSIS — H401430 Capsular glaucoma with pseudoexfoliation of lens, bilateral, stage unspecified: Secondary | ICD-10-CM | POA: Diagnosis not present

## 2015-08-23 DIAGNOSIS — H401193 Primary open-angle glaucoma, unspecified eye, severe stage: Secondary | ICD-10-CM | POA: Diagnosis not present

## 2015-09-22 DIAGNOSIS — I10 Essential (primary) hypertension: Secondary | ICD-10-CM | POA: Diagnosis not present

## 2015-09-22 DIAGNOSIS — M810 Age-related osteoporosis without current pathological fracture: Secondary | ICD-10-CM | POA: Diagnosis not present

## 2015-09-22 DIAGNOSIS — D509 Iron deficiency anemia, unspecified: Secondary | ICD-10-CM | POA: Diagnosis not present

## 2015-09-22 DIAGNOSIS — F411 Generalized anxiety disorder: Secondary | ICD-10-CM | POA: Diagnosis not present

## 2015-09-22 DIAGNOSIS — F332 Major depressive disorder, recurrent severe without psychotic features: Secondary | ICD-10-CM | POA: Diagnosis not present

## 2015-09-22 DIAGNOSIS — E7800 Pure hypercholesterolemia, unspecified: Secondary | ICD-10-CM | POA: Diagnosis not present

## 2015-09-22 DIAGNOSIS — Z23 Encounter for immunization: Secondary | ICD-10-CM | POA: Diagnosis not present

## 2015-09-22 DIAGNOSIS — E039 Hypothyroidism, unspecified: Secondary | ICD-10-CM | POA: Diagnosis not present

## 2015-09-28 DIAGNOSIS — Z803 Family history of malignant neoplasm of breast: Secondary | ICD-10-CM | POA: Diagnosis not present

## 2015-09-28 DIAGNOSIS — Z1231 Encounter for screening mammogram for malignant neoplasm of breast: Secondary | ICD-10-CM | POA: Diagnosis not present

## 2015-11-10 DIAGNOSIS — H40053 Ocular hypertension, bilateral: Secondary | ICD-10-CM | POA: Diagnosis not present

## 2015-11-10 DIAGNOSIS — H401430 Capsular glaucoma with pseudoexfoliation of lens, bilateral, stage unspecified: Secondary | ICD-10-CM | POA: Diagnosis not present

## 2015-11-10 DIAGNOSIS — H401123 Primary open-angle glaucoma, left eye, severe stage: Secondary | ICD-10-CM | POA: Diagnosis not present

## 2015-11-10 DIAGNOSIS — H401113 Primary open-angle glaucoma, right eye, severe stage: Secondary | ICD-10-CM | POA: Diagnosis not present

## 2015-12-09 DIAGNOSIS — I10 Essential (primary) hypertension: Secondary | ICD-10-CM | POA: Diagnosis not present

## 2015-12-09 DIAGNOSIS — D509 Iron deficiency anemia, unspecified: Secondary | ICD-10-CM | POA: Diagnosis not present

## 2015-12-09 DIAGNOSIS — R109 Unspecified abdominal pain: Secondary | ICD-10-CM | POA: Diagnosis not present

## 2016-01-05 DIAGNOSIS — I10 Essential (primary) hypertension: Secondary | ICD-10-CM | POA: Diagnosis not present

## 2016-01-26 DIAGNOSIS — I10 Essential (primary) hypertension: Secondary | ICD-10-CM | POA: Diagnosis not present

## 2016-01-26 DIAGNOSIS — R1310 Dysphagia, unspecified: Secondary | ICD-10-CM | POA: Diagnosis not present

## 2016-01-26 DIAGNOSIS — M810 Age-related osteoporosis without current pathological fracture: Secondary | ICD-10-CM | POA: Diagnosis not present

## 2016-01-28 ENCOUNTER — Other Ambulatory Visit: Payer: Self-pay | Admitting: Family Medicine

## 2016-01-28 DIAGNOSIS — R1310 Dysphagia, unspecified: Secondary | ICD-10-CM

## 2016-02-02 ENCOUNTER — Ambulatory Visit
Admission: RE | Admit: 2016-02-02 | Discharge: 2016-02-02 | Disposition: A | Discharge status: Home / Self Care | Payer: Medicare Other | Source: Ambulatory Visit | Attending: Family Medicine | Admitting: Family Medicine

## 2016-02-02 DIAGNOSIS — K219 Gastro-esophageal reflux disease without esophagitis: Secondary | ICD-10-CM | POA: Diagnosis not present

## 2016-02-02 DIAGNOSIS — R1310 Dysphagia, unspecified: Secondary | ICD-10-CM

## 2016-02-14 DIAGNOSIS — H401123 Primary open-angle glaucoma, left eye, severe stage: Secondary | ICD-10-CM | POA: Diagnosis not present

## 2016-02-14 DIAGNOSIS — H401113 Primary open-angle glaucoma, right eye, severe stage: Secondary | ICD-10-CM | POA: Diagnosis not present

## 2016-02-14 DIAGNOSIS — H04123 Dry eye syndrome of bilateral lacrimal glands: Secondary | ICD-10-CM | POA: Diagnosis not present

## 2016-02-14 DIAGNOSIS — H401430 Capsular glaucoma with pseudoexfoliation of lens, bilateral, stage unspecified: Secondary | ICD-10-CM | POA: Diagnosis not present

## 2016-02-15 DIAGNOSIS — F341 Dysthymic disorder: Secondary | ICD-10-CM | POA: Diagnosis not present

## 2016-02-15 DIAGNOSIS — F419 Anxiety disorder, unspecified: Secondary | ICD-10-CM | POA: Diagnosis not present

## 2016-02-19 ENCOUNTER — Emergency Department (HOSPITAL_COMMUNITY)
Admission: EM | Admit: 2016-02-19 | Discharge: 2016-02-19 | Disposition: A | Discharge status: Home / Self Care | Payer: Medicare Other | Attending: Emergency Medicine | Admitting: Emergency Medicine

## 2016-02-19 ENCOUNTER — Emergency Department (HOSPITAL_COMMUNITY): Payer: Medicare Other

## 2016-02-19 ENCOUNTER — Encounter (HOSPITAL_COMMUNITY): Payer: Self-pay

## 2016-02-19 DIAGNOSIS — I10 Essential (primary) hypertension: Secondary | ICD-10-CM | POA: Diagnosis not present

## 2016-02-19 DIAGNOSIS — Z7951 Long term (current) use of inhaled steroids: Secondary | ICD-10-CM | POA: Insufficient documentation

## 2016-02-19 DIAGNOSIS — K8020 Calculus of gallbladder without cholecystitis without obstruction: Secondary | ICD-10-CM | POA: Diagnosis not present

## 2016-02-19 DIAGNOSIS — Z79899 Other long term (current) drug therapy: Secondary | ICD-10-CM | POA: Diagnosis not present

## 2016-02-19 DIAGNOSIS — K625 Hemorrhage of anus and rectum: Secondary | ICD-10-CM | POA: Diagnosis not present

## 2016-02-19 DIAGNOSIS — R109 Unspecified abdominal pain: Secondary | ICD-10-CM | POA: Insufficient documentation

## 2016-02-19 DIAGNOSIS — Z87891 Personal history of nicotine dependence: Secondary | ICD-10-CM | POA: Diagnosis not present

## 2016-02-19 LAB — CBC WITH DIFFERENTIAL/PLATELET
Basophils Absolute: 0 10*3/uL (ref 0.0–0.1)
Basophils Relative: 1 %
Eosinophils Absolute: 0.1 10*3/uL (ref 0.0–0.7)
Eosinophils Relative: 3 %
HCT: 34.2 % — ABNORMAL LOW (ref 36.0–46.0)
Hemoglobin: 11.5 g/dL — ABNORMAL LOW (ref 12.0–15.0)
Lymphocytes Relative: 18 %
Lymphs Abs: 0.8 10*3/uL (ref 0.7–4.0)
MCH: 30.7 pg (ref 26.0–34.0)
MCHC: 33.6 g/dL (ref 30.0–36.0)
MCV: 91.4 fL (ref 78.0–100.0)
Monocytes Absolute: 0.5 10*3/uL (ref 0.1–1.0)
Monocytes Relative: 12 %
Neutro Abs: 3 10*3/uL (ref 1.7–7.7)
Neutrophils Relative %: 68 %
Platelets: 222 10*3/uL (ref 150–400)
RBC: 3.74 MIL/uL — ABNORMAL LOW (ref 3.87–5.11)
RDW: 13.7 % (ref 11.5–15.5)
WBC: 4.4 10*3/uL (ref 4.0–10.5)

## 2016-02-19 LAB — COMPREHENSIVE METABOLIC PANEL
ALT: 12 U/L — ABNORMAL LOW (ref 14–54)
AST: 20 U/L (ref 15–41)
Albumin: 4.4 g/dL (ref 3.5–5.0)
Alkaline Phosphatase: 67 U/L (ref 38–126)
Anion gap: 9 (ref 5–15)
BUN: 24 mg/dL — ABNORMAL HIGH (ref 6–20)
CO2: 26 mmol/L (ref 22–32)
Calcium: 9.7 mg/dL (ref 8.9–10.3)
Chloride: 106 mmol/L (ref 101–111)
Creatinine, Ser: 0.85 mg/dL (ref 0.44–1.00)
GFR calc Af Amer: >60 mL/min (ref >60)
GFR calc non Af Amer: >60 mL/min (ref >60)
Glucose, Bld: 98 mg/dL (ref 65–99)
Potassium: 3.8 mmol/L (ref 3.5–5.1)
Sodium: 141 mmol/L (ref 135–145)
Total Bilirubin: 1.1 mg/dL (ref 0.3–1.2)
Total Protein: 7.2 g/dL (ref 6.5–8.1)

## 2016-02-19 LAB — POC OCCULT BLOOD, ED: Fecal Occult Bld: POSITIVE — AB

## 2016-02-19 LAB — PROTIME-INR
INR: 1.13 (ref 0.00–1.49)
Prothrombin Time: 14.7 seconds (ref 11.6–15.2)

## 2016-02-19 MED ORDER — IOPAMIDOL (ISOVUE-300) INJECTION 61%
100.0000 mL | Freq: Once | INTRAVENOUS | Status: AC | PRN
  Administered 2016-02-19: 100 mL via INTRAVENOUS

## 2016-02-19 MED ORDER — DOXYCYCLINE HYCLATE 100 MG PO TABS: 100.0000 mg | ORAL_TABLET | Freq: Once | ORAL | Status: DC

## 2016-02-19 NOTE — Discharge Instructions (Signed)
Please follow up with your primary care provider for re-evaluation. If bleeding returns please return to the ED for immediate evaluation.   Gastrointestinal Bleeding Gastrointestinal (GI) bleeding means there is bleeding somewhere along the digestive tract, between the mouth and anus. CAUSES  There are many different problems that can cause GI bleeding. Possible causes include:  Esophagitis. This is inflammation, irritation, or swelling of the esophagus.  Hemorrhoids.These are veins that are full of blood (engorged) in the rectum. They cause pain, inflammation, and may bleed.  Anal fissures.These are areas of painful tearing which may bleed. They are often caused by passing hard stool.  Diverticulosis.These are pouches that form on the colon over time, with age, and may bleed significantly.  Diverticulitis.This is inflammation in areas with diverticulosis. It can cause pain, fever, and bloody stools, although bleeding is rare.  Polyps and cancer. Colon cancer often starts out as precancerous polyps.  Gastritis and ulcers.Bleeding from the upper gastrointestinal tract (near the stomach) may travel through the intestines and produce black, sometimes tarry, often bad smelling stools. In certain cases, if the bleeding is fast enough, the stools may not be black, but red. This condition may be life-threatening. SYMPTOMS   Vomiting bright red blood or material that looks like coffee grounds.  Bloody, black, or tarry stools. DIAGNOSIS  Your caregiver may diagnose your condition by taking your history and performing a physical exam. More tests may be needed, including:  X-rays and other imaging tests.  Esophagogastroduodenoscopy (EGD). This test uses a flexible, lighted tube to look at your esophagus, stomach, and small  intestine.  Colonoscopy. This test uses a flexible, lighted tube to look at your colon. TREATMENT  Treatment depends on the cause of your bleeding.   For bleeding from the esophagus, stomach, small intestine, or colon, the caregiver doing your EGD or colonoscopy may be able to stop the bleeding as part of the procedure.  Inflammation or infection of the colon can be treated with medicines.  Many rectal problems can be treated with creams, suppositories, or warm baths.  Surgery is sometimes needed.  Blood transfusions are sometimes needed if you have lost a lot of blood. If bleeding is slow, you may be allowed to go home. If there is a lot of bleeding, you will need to stay in the hospital for observation. HOME CARE INSTRUCTIONS   Take any medicines exactly as prescribed.  Keep your stools soft by eating foods that are high in fiber. These foods include whole grains, legumes, fruits, and vegetables. Prunes (1 to 3 a day) work well for many people.  Drink enough fluids to keep your urine clear or pale yellow. SEEK IMMEDIATE MEDICAL CARE IF:   Your bleeding increases.  You feel lightheaded, weak, or you faint.  You have severe cramps in your back or abdomen.  You pass large blood clots in your stool.  Your problems are getting worse. MAKE SURE YOU:   Understand these instructions.  Will watch your condition.  Will get help right away if you  are not doing well or get worse.   This information is not intended to replace advice given to you by your health care provider. Make sure you discuss any questions you have with your health care provider.   Document Released: 10/20/2000 Document Revised: 10/09/2012 Document Reviewed: 04/12/2015 Elsevier Interactive Patient Education [employer].

## 2016-02-19 NOTE — ED Provider Notes (Signed)
CSN: [id number]     Arrival date & time 02/19/16  1042 History   First MD Initiated Contact with Patient 02/19/16 1053     Chief Complaint  Patient presents with  . Rectal Bleeding    HPI   82 YOF presents today with rectal bleeding. Pt reports a small amount of bright red blood in her depends this morning. She notes that it was not actively bleeding at that time. She notes that she went to bathroom shortly after and had another episode of BRB per rectum with bowel movement. Pt notes that since her bowel movement she has felt abdominal discomfort but no pain. She reports over the last several days she has had hard stools. Pt reports a history of the same requiring transfusion. Patient is not on any blood thinners.   Pt was seen in August 2016 For acute blood loss anemia from rectal bleed. Initially she had a hemoglobin of 11.2, but over the course of 6 hours at a drop to 8.5; subsequently dropped to 7.7 the next morning. Patient had a normal CT, followed by nuclear scan which showed no acute findings.     Past Medical History  Diagnosis Date  . Hypertension    Past Surgical History  Procedure Laterality Date  . Joint replacement     No family history on file. Social History  Substance Use Topics  . Smoking status: Former Research scientist (life sciences)  . Smokeless tobacco: None  . Alcohol Use: No   OB History    No data available     Review of Systems  All other systems reviewed and are negative.   Allergies  Sulfa antibiotics  Home Medications   Prior to Admission medications   Medication Sig Start Date End Date Taking? Authorizing Provider  bimatoprost (LUMIGAN) 0.01 % SOLN Place 1 drop into both eyes at bedtime.   Yes Historical Provider, MD  dorzolamide-timolol (COSOPT) 22.3-6.8 MG/ML ophthalmic solution Place 1 drop into both eyes 2 (two) times daily.   Yes Historical Provider, MD  fluticasone (FLONASE) 50 MCG/ACT nasal spray Place 1 spray into both nostrils daily as needed for allergies  or rhinitis.   Yes Historical Provider, MD  labetalol (NORMODYNE) 100 MG tablet Take 100 mg by mouth 2 (two) times daily.   Yes Historical Provider, MD  LORazepam (ATIVAN) 2 MG tablet Take 2 mg by mouth at bedtime as needed for anxiety or sleep.  04/27/15  Yes Historical Provider, MD  losartan (COZAAR) 50 MG tablet Take 100 mg by mouth daily.  06/01/15  Yes Historical Provider, MD  Lutein 20 MG TABS Take 20 mg by mouth daily.   Yes Historical Provider, MD  Multiple Vitamin (MULTIVITAMIN WITH MINERALS) TABS tablet Take 1 tablet by mouth daily.   Yes Historical Provider, MD  pilocarpine (PILOCAR) 2 % ophthalmic solution Place 1 drop into both eyes 2 (two) times daily.   Yes Historical Provider, MD  simvastatin (ZOCOR) 10 MG tablet Take 5 mg by mouth daily at 6 PM.  05/21/15  Yes Historical Provider, MD  SYNTHROID 25 MCG tablet Take 25 mcg by mouth daily. 05/12/15  Yes Historical Provider, MD  vitamin C (ASCORBIC ACID) 500 MG tablet Take 500 mg by mouth daily.   Yes Historical Provider, MD  pantoprazole (PROTONIX) 40 MG tablet Take 1 tablet (40 mg total) by mouth daily. Patient not taking: Reported on 02/19/2016 06/18/15   Oswald Hillock, MD  potassium chloride SA (K-DUR,KLOR-CON) 20 MEQ tablet Take 1 tablet (20 mEq  total) by mouth daily. 06/18/15 06/21/15  Oswald Hillock, MD   BP 180/110 mmHg  Pulse 70  Temp(Src) 97.6 F (36.4 C) (Oral)  Resp 18  SpO2 98%   Physical Exam  Constitutional: She is oriented to person, place, and time. She appears well-developed and well-nourished.  HENT:  Head: Normocephalic and atraumatic.  Eyes: Conjunctivae are normal. Pupils are equal, round, and reactive to light. Right eye exhibits no discharge. Left eye exhibits no discharge. No scleral icterus.  Neck: Normal range of motion. No JVD present. No tracheal deviation present.  Pulmonary/Chest: Effort normal. No stridor.  Abdominal: Soft. She exhibits no distension and no mass. There is no tenderness. There is no rebound  and no guarding.  Genitourinary:  Dried blood noted, no active bleeding, no hemorrhoids noted  Neurological: She is alert and oriented to person, place, and time. Coordination normal.  Skin: Skin is warm and dry. No rash noted. No erythema. No pallor.  Psychiatric: She has a normal mood and affect. Her behavior is normal. Judgment and thought content normal.  Nursing note and vitals reviewed.   ED Course  Procedures (including critical care time) Labs Review Labs Reviewed  CBC WITH DIFFERENTIAL/PLATELET - Abnormal; Notable for the following:    RBC 3.74 (*)    Hemoglobin 11.5 (*)    HCT 34.2 (*)    All other components within normal limits  COMPREHENSIVE METABOLIC PANEL - Abnormal; Notable for the following:    BUN 24 (*)    ALT 12 (*)    All other components within normal limits  POC OCCULT BLOOD, ED - Abnormal; Notable for the following:    Fecal Occult Bld POSITIVE (*)    All other components within normal limits  PROTIME-INR    Imaging Review Ct Abdomen Pelvis W Contrast  02/19/2016  CLINICAL DATA:  Rectal bleeding with history of diverticulitis. EXAM: CT ABDOMEN AND PELVIS WITH CONTRAST TECHNIQUE: Multidetector CT imaging of the abdomen and pelvis was performed using the standard protocol following bolus administration of intravenous contrast. CONTRAST:  162mL ISOVUE-300 IOPAMIDOL (ISOVUE-300) INJECTION 61% COMPARISON:  06/14/2015 FINDINGS: Lower chest: Bilateral breast implants. Clear lung bases. Mild cardiomegaly. Trace right pleural fluid or thickening is similar. Hepatobiliary: Normal liver. Cholelithiasis without acute cholecystitis. No biliary duct dilatation. Pancreas: Pancreatic parenchymal calcifications are likely related to a chronic calcific pancreatitis. There is pancreatic atrophy with borderline duct dilatation. No obstructive stone or mass identified. A large periampullary duodenal diverticulum is noted. Spleen: Normal in size, without focal abnormality.  Adrenals/Urinary Tract: Normal adrenal glands. Bilateral renal cysts. No hydronephrosis. Degraded evaluation of the pelvis, secondary to beam hardening artifact from right hip arthroplasty. Grossly normal urinary bladder. Stomach/Bowel: Normal stomach, without wall thickening. Marked colonic diverticulosis. No evidence of diverticulitis. Normal small bowel. Vascular/Lymphatic: Aortic and branch vessel atherosclerosis. Ectasia of both common iliac arteries, on the order of 1.4 cm. No abdominal and no gross pelvic adenopathy. Reproductive: Uterine atrophy versus partial hysterectomy. No gross adnexal mass. Other: No significant free fluid. Musculoskeletal: Osteopenia. Inferior endplate compression deformity at L1 is new since the prior. IMPRESSION: 1. No acute process in the abdomen or pelvis. extensive diverticulosis without evidence of diverticulitis. No other explanation for rectal bleeding. 2. Cholelithiasis. 3. Aortic tortuosity with ectasia of both common iliac arteries. 4. Degraded evaluation of the pelvis, secondary to beam hardening artifact from right hip arthroplasty. 5. New L1 compression deformity, mild. 6. Chronic calcific pancreatitis. Electronically Signed   By: Adria Devon.D.  On: 02/19/2016 14:06   I have personally reviewed and evaluated these images and lab results as part of my medical decision-making.   EKG Interpretation None      MDM   Final diagnoses:  Rectal bleed    Labs: CBC, CMP, PT/INR- Hemoglobin 11.5  Imaging: CT abdomen pelvis with contrast  Consults:  Therapeutics:  Discharge Meds:   Assessment/Plan: 19 -year-old female presents today with complaint of rectal bleeding. Patient had bright red blood earlier today, none presently. Patient reports a severe episode previously required transfusion, she reports this is not nearly as bad as previous. Patient has a normal hemoglobin here, no signs of active bleeding, and is requesting discharge home. Her CT scan  showed no acute findings that would indicate source of bleed. Patient was hemodynamically stable, in no acute distress. Patient has had constipation with hard stools, this could likely represent internal hemorrhoid. She'll be instructed to follow-up with her primary care provider as soon as possible for reevaluation. She is instructed to return immediately to the emergency room if any new or worsening signs or symptoms present. Patient verbalized understanding and agreement today's plan had no further questions or concerns        Okey Regal, PA-C 02/19/16 [place], MD 02/20/16 [phone]

## 2016-02-19 NOTE — ED Notes (Signed)
Pt presents with c/o rectal bleeding. Pt reports a hx of same, has had transfusions in the past for the same. Pt also reports a hx of diverticulitis.

## 2016-02-19 NOTE — ED Notes (Signed)
Pt ambulated to bathroom twice, sts "There's no more blood coming". She is hoping to be discharged soon

## 2016-02-21 DIAGNOSIS — S32000A Wedge compression fracture of unspecified lumbar vertebra, initial encounter for closed fracture: Secondary | ICD-10-CM | POA: Diagnosis not present

## 2016-02-21 DIAGNOSIS — K625 Hemorrhage of anus and rectum: Secondary | ICD-10-CM | POA: Diagnosis not present

## 2016-02-21 DIAGNOSIS — I10 Essential (primary) hypertension: Secondary | ICD-10-CM | POA: Diagnosis not present

## 2016-02-21 DIAGNOSIS — R634 Abnormal weight loss: Secondary | ICD-10-CM | POA: Diagnosis not present

## 2016-02-22 ENCOUNTER — Other Ambulatory Visit: Payer: Self-pay | Admitting: Family Medicine

## 2016-02-22 DIAGNOSIS — S32000A Wedge compression fracture of unspecified lumbar vertebra, initial encounter for closed fracture: Secondary | ICD-10-CM

## 2016-02-23 ENCOUNTER — Ambulatory Visit
Admission: RE | Admit: 2016-02-23 | Discharge: 2016-02-23 | Disposition: A | Discharge status: Home / Self Care | Payer: Medicare Other | Source: Ambulatory Visit | Attending: Family Medicine | Admitting: Family Medicine

## 2016-02-23 DIAGNOSIS — S32000A Wedge compression fracture of unspecified lumbar vertebra, initial encounter for closed fracture: Secondary | ICD-10-CM

## 2016-02-23 DIAGNOSIS — M4806 Spinal stenosis, lumbar region: Secondary | ICD-10-CM | POA: Diagnosis not present

## 2016-02-23 MED ORDER — GADOBENATE DIMEGLUMINE 529 MG/ML IV SOLN
12.0000 mL | Freq: Once | INTRAVENOUS | Status: AC | PRN
  Administered 2016-02-23: 12 mL via INTRAVENOUS

## 2016-02-28 DIAGNOSIS — K625 Hemorrhage of anus and rectum: Secondary | ICD-10-CM | POA: Diagnosis not present

## 2016-03-06 DIAGNOSIS — L603 Nail dystrophy: Secondary | ICD-10-CM | POA: Diagnosis not present

## 2016-03-06 DIAGNOSIS — I10 Essential (primary) hypertension: Secondary | ICD-10-CM | POA: Diagnosis not present

## 2016-03-06 DIAGNOSIS — D649 Anemia, unspecified: Secondary | ICD-10-CM | POA: Diagnosis not present

## 2016-03-22 DIAGNOSIS — K625 Hemorrhage of anus and rectum: Secondary | ICD-10-CM | POA: Diagnosis not present

## 2016-03-22 DIAGNOSIS — D500 Iron deficiency anemia secondary to blood loss (chronic): Secondary | ICD-10-CM | POA: Diagnosis not present

## 2016-03-22 DIAGNOSIS — K5730 Diverticulosis of large intestine without perforation or abscess without bleeding: Secondary | ICD-10-CM | POA: Diagnosis not present

## 2016-03-22 DIAGNOSIS — R634 Abnormal weight loss: Secondary | ICD-10-CM | POA: Diagnosis not present

## 2016-03-30 DIAGNOSIS — K625 Hemorrhage of anus and rectum: Secondary | ICD-10-CM | POA: Diagnosis not present

## 2016-03-30 DIAGNOSIS — D123 Benign neoplasm of transverse colon: Secondary | ICD-10-CM | POA: Diagnosis not present

## 2016-03-30 DIAGNOSIS — K921 Melena: Secondary | ICD-10-CM | POA: Diagnosis not present

## 2016-03-30 DIAGNOSIS — K5730 Diverticulosis of large intestine without perforation or abscess without bleeding: Secondary | ICD-10-CM | POA: Diagnosis not present

## 2016-03-30 DIAGNOSIS — K635 Polyp of colon: Secondary | ICD-10-CM | POA: Diagnosis not present

## 2016-04-10 DIAGNOSIS — B078 Other viral warts: Secondary | ICD-10-CM | POA: Diagnosis not present

## 2016-04-10 DIAGNOSIS — D485 Neoplasm of uncertain behavior of skin: Secondary | ICD-10-CM | POA: Diagnosis not present

## 2016-04-10 DIAGNOSIS — L821 Other seborrheic keratosis: Secondary | ICD-10-CM | POA: Diagnosis not present

## 2016-04-12 DIAGNOSIS — R030 Elevated blood-pressure reading, without diagnosis of hypertension: Secondary | ICD-10-CM | POA: Diagnosis not present

## 2016-04-12 DIAGNOSIS — M5416 Radiculopathy, lumbar region: Secondary | ICD-10-CM | POA: Diagnosis not present

## 2016-04-19 DIAGNOSIS — D509 Iron deficiency anemia, unspecified: Secondary | ICD-10-CM | POA: Diagnosis not present

## 2016-04-19 DIAGNOSIS — I10 Essential (primary) hypertension: Secondary | ICD-10-CM | POA: Diagnosis not present

## 2016-04-28 DIAGNOSIS — M4806 Spinal stenosis, lumbar region: Secondary | ICD-10-CM | POA: Diagnosis not present

## 2016-04-28 DIAGNOSIS — M5416 Radiculopathy, lumbar region: Secondary | ICD-10-CM | POA: Diagnosis not present

## 2016-05-12 DIAGNOSIS — B078 Other viral warts: Secondary | ICD-10-CM | POA: Diagnosis not present

## 2016-05-22 DIAGNOSIS — S90822A Blister (nonthermal), left foot, initial encounter: Secondary | ICD-10-CM | POA: Diagnosis not present

## 2016-06-08 DIAGNOSIS — B078 Other viral warts: Secondary | ICD-10-CM | POA: Diagnosis not present

## 2016-07-19 DIAGNOSIS — B078 Other viral warts: Secondary | ICD-10-CM | POA: Diagnosis not present

## 2016-07-20 DIAGNOSIS — H401123 Primary open-angle glaucoma, left eye, severe stage: Secondary | ICD-10-CM | POA: Diagnosis not present

## 2016-07-20 DIAGNOSIS — H04123 Dry eye syndrome of bilateral lacrimal glands: Secondary | ICD-10-CM | POA: Diagnosis not present

## 2016-07-20 DIAGNOSIS — H401113 Primary open-angle glaucoma, right eye, severe stage: Secondary | ICD-10-CM | POA: Diagnosis not present

## 2016-07-20 DIAGNOSIS — H401430 Capsular glaucoma with pseudoexfoliation of lens, bilateral, stage unspecified: Secondary | ICD-10-CM | POA: Diagnosis not present

## 2016-07-21 DIAGNOSIS — Z23 Encounter for immunization: Secondary | ICD-10-CM | POA: Diagnosis not present

## 2016-07-31 ENCOUNTER — Encounter (HOSPITAL_COMMUNITY): Payer: Self-pay | Admitting: *Deleted

## 2016-07-31 ENCOUNTER — Other Ambulatory Visit: Payer: Self-pay | Admitting: Gastroenterology

## 2016-07-31 DIAGNOSIS — R197 Diarrhea, unspecified: Secondary | ICD-10-CM | POA: Diagnosis not present

## 2016-07-31 DIAGNOSIS — R195 Other fecal abnormalities: Secondary | ICD-10-CM | POA: Diagnosis not present

## 2016-07-31 DIAGNOSIS — K921 Melena: Secondary | ICD-10-CM | POA: Diagnosis not present

## 2016-08-04 ENCOUNTER — Ambulatory Visit (HOSPITAL_COMMUNITY): Payer: Medicare Other | Admitting: Anesthesiology

## 2016-08-04 ENCOUNTER — Ambulatory Visit (HOSPITAL_COMMUNITY)
Admission: RE | Admit: 2016-08-04 | Discharge: 2016-08-04 | Disposition: A | Discharge status: Home / Self Care | Payer: Medicare Other | Source: Ambulatory Visit | Attending: Gastroenterology | Admitting: Gastroenterology

## 2016-08-04 ENCOUNTER — Encounter (HOSPITAL_COMMUNITY)
Admission: RE | Disposition: A | Discharge status: Home / Self Care | Payer: Self-pay | Source: Ambulatory Visit | Attending: Gastroenterology

## 2016-08-04 ENCOUNTER — Encounter (HOSPITAL_COMMUNITY): Payer: Self-pay

## 2016-08-04 DIAGNOSIS — K921 Melena: Secondary | ICD-10-CM | POA: Insufficient documentation

## 2016-08-04 DIAGNOSIS — D175 Benign lipomatous neoplasm of intra-abdominal organs: Secondary | ICD-10-CM | POA: Insufficient documentation

## 2016-08-04 DIAGNOSIS — Q396 Congenital diverticulum of esophagus: Secondary | ICD-10-CM | POA: Insufficient documentation

## 2016-08-04 DIAGNOSIS — Z9660 Presence of unspecified orthopedic joint implant: Secondary | ICD-10-CM | POA: Insufficient documentation

## 2016-08-04 DIAGNOSIS — K3189 Other diseases of stomach and duodenum: Secondary | ICD-10-CM | POA: Insufficient documentation

## 2016-08-04 DIAGNOSIS — I10 Essential (primary) hypertension: Secondary | ICD-10-CM | POA: Insufficient documentation

## 2016-08-04 DIAGNOSIS — Z87891 Personal history of nicotine dependence: Secondary | ICD-10-CM | POA: Insufficient documentation

## 2016-08-04 DIAGNOSIS — K225 Diverticulum of esophagus, acquired: Secondary | ICD-10-CM | POA: Diagnosis not present

## 2016-08-04 DIAGNOSIS — K449 Diaphragmatic hernia without obstruction or gangrene: Secondary | ICD-10-CM | POA: Insufficient documentation

## 2016-08-04 DIAGNOSIS — K317 Polyp of stomach and duodenum: Secondary | ICD-10-CM | POA: Diagnosis not present

## 2016-08-04 DIAGNOSIS — K2970 Gastritis, unspecified, without bleeding: Secondary | ICD-10-CM | POA: Diagnosis not present

## 2016-08-04 HISTORY — DX: Melena: K92.1

## 2016-08-04 HISTORY — DX: Unspecified glaucoma: H40.9

## 2016-08-04 HISTORY — DX: Hypothyroidism, unspecified: E03.9

## 2016-08-04 HISTORY — PX: ESOPHAGOGASTRODUODENOSCOPY (EGD) WITH PROPOFOL: SHX5813

## 2016-08-04 SURGERY — ESOPHAGOGASTRODUODENOSCOPY (EGD) WITH PROPOFOL
Anesthesia: Monitor Anesthesia Care

## 2016-08-04 MED ORDER — LACTATED RINGERS IV SOLN
INTRAVENOUS | Status: DC
  Administered 2016-08-04: 1000 mL via INTRAVENOUS

## 2016-08-04 MED ORDER — LIDOCAINE HCL (CARDIAC) 20 MG/ML IV SOLN
INTRAVENOUS | Status: DC | PRN
  Administered 2016-08-04: 50 mg via INTRAVENOUS

## 2016-08-04 MED ORDER — PROPOFOL 500 MG/50ML IV EMUL
INTRAVENOUS | Status: DC | PRN
  Administered 2016-08-04 (×2): 30 mg via INTRAVENOUS

## 2016-08-04 MED ORDER — PROPOFOL 500 MG/50ML IV EMUL
INTRAVENOUS | Status: DC | PRN
  Administered 2016-08-04: 100 ug/kg/min via INTRAVENOUS

## 2016-08-04 MED ORDER — SODIUM CHLORIDE 0.9 % IV SOLN: INTRAVENOUS | Status: DC

## 2016-08-04 SURGICAL SUPPLY — 15 items

## 2016-08-04 NOTE — H&P (Signed)
Margaret Li HPI:  This is an 82 year old female with a PMH of diverticula and s/p colonoscopy earlier this year for this complaint, however, over the past several days she complained about loose black stools.  A rectal examination in the office was performed and it confirmed the melenic type stool and it was heme positive.  Her HGB in the office on 07/31/2016 was at 11.6 g/dL and she denies any further melena since her office visit.  Past Medical History:  Diagnosis Date  . Black tarry stools    "per patient"  . Glaucoma   . Hypertension   . Hypothyroidism     Past Surgical History:  Procedure Laterality Date  . ABDOMINAL HYSTERECTOMY     80's  . CATARACT EXTRACTION, BILATERAL    . HERNIA REPAIR Right    Inguinal hernia repair '12 -Dr. Rise Patience  . JOINT REPLACEMENT Right    -RTHA -4 '11 - Dr. [name]- [facility]    History reviewed. No pertinent family history.  Social History:  reports that she quit smoking about 5 years ago. She has never used smokeless tobacco. She reports that she does not drink alcohol or use drugs.  Allergies:  Allergies  Allergen Reactions  . Sulfa Antibiotics     Unknown reaction     Medications: Scheduled: Continuous:  No results found for this or any previous visit (from the past 24 hour(s)).   No results found.  ROS:  As stated above in the HPI otherwise negative.  There were no vitals taken for this visit.    PE: Gen: NAD, Alert and Oriented HEENT:  [place]/AT, EOMI Neck: Supple, no LAD Lungs: CTA Bilaterally CV: RRR without M/G/R ABM: Soft, NTND, +BS Ext: No C/C/E  Assessment/Plan: 1) Melena. 2) Heme positive stool.   I suspect that she has an upper GI source for her bleeding.  I will perform an EGD and treat any findings.  Margaret Li D 08/04/2016, 9:58 AM

## 2016-08-04 NOTE — Anesthesia Postprocedure Evaluation (Signed)
Anesthesia Post Note  Patient: Margaret Li  Procedure(s) Performed: Procedure(s) (LRB): ESOPHAGOGASTRODUODENOSCOPY (EGD) WITH PROPOFOL (N/A) HOT HEMOSTASIS (ARGON PLASMA COAGULATION/BICAP) (N/A)  Patient location during evaluation: PACU Anesthesia Type: MAC Level of consciousness: awake and alert Pain management: pain level controlled Vital Signs Assessment: post-procedure vital signs reviewed and stable Respiratory status: spontaneous breathing, nonlabored ventilation, respiratory function stable and patient connected to nasal cannula oxygen Cardiovascular status: stable and blood pressure returned to baseline Anesthetic complications: no    Last Vitals:  Vitals:   08/04/16 1038  BP: 120/72  Pulse: 66  Resp: 17  Temp: 36.3 C    Last Pain:  Vitals:   08/04/16 1038  TempSrc: Oral                 Zenaida Deed

## 2016-08-04 NOTE — Discharge Instructions (Signed)
YOU HAD AN ENDOSCOPIC PROCEDURE TODAY: Refer to the procedure report and other information in the discharge instructions given to you for any specific questions about what was found during the examination. If this information does not answer your questions, please call [facility] at [phone] to clarify.  ° °YOU SHOULD EXPECT: Some feelings of bloating in the abdomen. Passage of more gas than usual. Walking can help get rid of the air that was put into your GI tract during the procedure and reduce the bloating. If you had a lower endoscopy (such as a colonoscopy or flexible sigmoidoscopy) you may notice spotting of blood in your stool or on the toilet paper. Some abdominal soreness may be present for a day or two, also. ° °DIET: Your first meal following the procedure should be a light meal and then it is ok to progress to your normal diet. A half-sandwich or bowl of soup is an example of a good first meal. Heavy or fried foods are harder to digest and may make you feel nauseous or bloated. Drink plenty of fluids but you should avoid alcoholic beverages for 24 hours. If you had an esophageal dilation, please see attached information for diet.  ° °ACTIVITY: Your care partner should take you home directly after the procedure. You should plan to take it easy, moving slowly for the rest of the day. You can resume normal activity the day after the procedure however YOU SHOULD NOT DRIVE, use power tools, machinery or perform tasks that involve climbing or major physical exertion for 24 hours (because of the sedation medicines used during the test).  ° °SYMPTOMS TO REPORT IMMEDIATELY: °A gastroenterologist can be reached at any hour. Please call [phone]  for any of the following symptoms:  °Following lower endoscopy (colonoscopy, flexible sigmoidoscopy) °Excessive amounts of blood in the stool  °Significant tenderness, worsening of abdominal pains  °Swelling of the abdomen that is new, acute  °Fever of  100° or higher  °Following upper endoscopy (EGD, EUS, ERCP, esophageal dilation) °Vomiting of blood or coffee ground material  °New, significant abdominal pain  °New, significant chest pain or pain under the shoulder blades  °Painful or persistently difficult swallowing  °New shortness of breath  °Black, tarry-looking or red, bloody stools ° °FOLLOW UP:  °If any biopsies were taken you will be contacted by phone or by letter within the next 1-3 weeks. Call [phone]  if you have not heard about the biopsies in 3 weeks.  °Please also call with any specific questions about appointments or follow up tests. ° °

## 2016-08-04 NOTE — Transfer of Care (Signed)
Immediate Anesthesia Transfer of Care Note  Patient: Margaret Li  Procedure(s) Performed: Procedure(s): ESOPHAGOGASTRODUODENOSCOPY (EGD) WITH PROPOFOL (N/A) HOT HEMOSTASIS (ARGON PLASMA COAGULATION/BICAP) (N/A)  Patient Location: PACU  Anesthesia Type:MAC  Level of Consciousness: awake, alert  and oriented  Airway & Oxygen Therapy: Patient Spontanous Breathing and Patient connected to nasal cannula oxygen  Post-op Assessment: Report given to RN and Post -op Vital signs reviewed and stable  Post vital signs: Reviewed and stable  Last Vitals: There were no vitals filed for this visit.  Last Pain: There were no vitals filed for this visit.       Complications: No apparent anesthesia complications

## 2016-08-04 NOTE — Op Note (Signed)
[facility] Patient Name: Margaret Li Procedure Date: 08/04/2016 MRN: [id number] Attending MD: Carol Ada , MD Date of Birth: 08-06-1929 CSN: [id number] Age: 82 Admit Type: Outpatient Procedure:                Upper GI endoscopy Indications:              Heme positive stool, Melena Providers:                Carol Ada, MD, Elmer Ramp. Tilden Dome, RN, Ralene Bathe,                            Technician, Herbie Drape, CRNA Referring MD:              Medicines:                Propofol per Anesthesia Complications:            No immediate complications. Estimated Blood Loss:     Estimated blood loss: none. Procedure:                Pre-Anesthesia Assessment:                           - Prior to the procedure, a History and Physical                            was performed, and patient medications and                            allergies were reviewed. The patient's tolerance of                            previous anesthesia was also reviewed. The risks                            and benefits of the procedure and the sedation                            options and risks were discussed with the patient.                            All questions were answered, and informed consent                            was obtained. Prior Anticoagulants: The patient has                            taken no previous anticoagulant or antiplatelet                            agents. ASA Grade Assessment: II - A patient with                            mild systemic disease. After reviewing the risks  and benefits, the patient was deemed in                            satisfactory condition to undergo the procedure.                           - Sedation was administered by an anesthesia                            professional. Deep sedation was attained.                           After obtaining informed consent, the endoscope was                            passed under  direct vision. Throughout the                            procedure, the patient's blood pressure, pulse, and                            oxygen saturations were monitored continuously. The                            was introduced through the mouth, and advanced to                            the second part of duodenum. The upper GI endoscopy                            was technically difficult and complex due to                            abnormal anatomy. The patient tolerated the                            procedure well. Scope In: Scope Out: Findings:      A non-bleeding diverticulum with a large opening and no stigmata of       recent bleeding was found in the middle third of the esophagus and in       the lower third of the esophagus.      A large paraesophageal hernia was found.      Diffuse moderately erythematous mucosa without bleeding was found in the       gastric antrum.      There was a large lipoma in the second portion of the duodenum.      The patient exhibited two esophageal diverticulum in the mid and distal       esophagus. The Z-line was sharp and upon initial inspection there was a       small hiatal hernia, however, when the endoscope was advanced into the       gastric lumen the endoscope had difficulty with passed into the distal       portion of the stomach. With gentle advancement of the endoscope, the  scope looped out of the abnormality and into the distal gastric lumen.       The scope was straightened and in the retroflexed view it was determined       that the patient had a paraesophageal hiatal hernia. The scope helped to       reduce the hernia. In the hernia there was some evidence of hematin, but       no evidence of ulcerations, erosions, or vascular abnormalities. In the       second portion of the duodenum a large pedundunculated lipoma was       identified. Because of the patient's advanced age and the current       findings, I opted not to  perform any biopsies for the gastric erythema. Impression:               - Diverticulum in the middle third of the esophagus                            and in the lower third of the esophagus.                           - Large paraesophageal hernia.                           - Erythematous mucosa in the antrum.                           - Duodenal lipoma.                           - No specimens collected. Moderate Sedation:      N/A- Per Anesthesia Care Recommendation:           - Patient has a contact number available for                            emergencies. The signs and symptoms of potential                            delayed complications were discussed with the                            patient. Return to normal activities tomorrow.                            Written discharge instructions were provided to the                            patient.                           - Resume previous diet.                           - Continue present medications.                           - Do an upper GI series.                           -  PPI QD. Procedure Code(s):        --- Professional ---                           [phone], Esophagogastroduodenoscopy, flexible,                            transoral; diagnostic, including collection of                            specimen(s) by brushing or washing, when performed                            (separate procedure) Diagnosis Code(s):        --- Professional ---                           Q39.6, Congenital diverticulum of esophagus                           K44.9, Diaphragmatic hernia without obstruction or                            gangrene                           K31.89, Other diseases of stomach and duodenum                           D17.5, Benign lipomatous neoplasm of                            intra-abdominal organs                           R19.5, Other fecal abnormalities                           K92.1, Melena (includes  Hematochezia) CPT copyright 2016 [employer]. All rights reserved. The codes documented in this report are preliminary and upon coder review may  be revised to meet current compliance requirements. Carol Ada, MD Carol Ada, MD 08/04/2016 10:48:38 AM This report has been signed electronically. Number of Addenda: 0

## 2016-08-04 NOTE — Anesthesia Preprocedure Evaluation (Signed)
Anesthesia Evaluation  Patient identified by MRN, date of birth, ID band Patient awake    Reviewed: Allergy & Precautions, H&P , NPO status , Patient's Chart, lab work & pertinent test results  History of Anesthesia Complications Negative for: history of anesthetic complications  Airway Mallampati: II  TM Distance: >3 FB Neck ROM: full    Dental no notable dental hx.    Pulmonary former smoker,    Pulmonary exam normal breath sounds clear to auscultation       Cardiovascular hypertension, Normal cardiovascular exam Rhythm:regular Rate:Normal     Neuro/Psych negative neurological ROS     GI/Hepatic negative GI ROS, Neg liver ROS,   Endo/Other  Hypothyroidism   Renal/GU negative Renal ROS     Musculoskeletal   Abdominal   Peds  Hematology negative hematology ROS (+)   Anesthesia Other Findings   Reproductive/Obstetrics negative OB ROS                             Anesthesia Physical Anesthesia Plan  ASA: II  Anesthesia Plan: MAC   Post-op Pain Management:    Induction: Intravenous  Airway Management Planned: Nasal Cannula  Additional Equipment:   Intra-op Plan:   Post-operative Plan:   Informed Consent: I have reviewed the patients History and Physical, chart, labs and discussed the procedure including the risks, benefits and alternatives for the proposed anesthesia with the patient or authorized representative who has indicated his/her understanding and acceptance.   Dental Advisory Given  Plan Discussed with: Anesthesiologist, CRNA and Surgeon  Anesthesia Plan Comments:         Anesthesia Quick Evaluation

## 2016-08-16 DIAGNOSIS — Z8781 Personal history of (healed) traumatic fracture: Secondary | ICD-10-CM | POA: Diagnosis not present

## 2016-08-16 DIAGNOSIS — R197 Diarrhea, unspecified: Secondary | ICD-10-CM | POA: Diagnosis not present

## 2016-08-16 DIAGNOSIS — D649 Anemia, unspecified: Secondary | ICD-10-CM | POA: Diagnosis not present

## 2016-08-16 DIAGNOSIS — M810 Age-related osteoporosis without current pathological fracture: Secondary | ICD-10-CM | POA: Diagnosis not present

## 2016-08-22 DIAGNOSIS — B078 Other viral warts: Secondary | ICD-10-CM | POA: Diagnosis not present

## 2016-09-04 DIAGNOSIS — K921 Melena: Secondary | ICD-10-CM | POA: Diagnosis not present

## 2016-09-04 DIAGNOSIS — R195 Other fecal abnormalities: Secondary | ICD-10-CM | POA: Diagnosis not present

## 2016-09-12 ENCOUNTER — Ambulatory Visit: Payer: Medicare Other | Attending: Internal Medicine

## 2016-09-12 DIAGNOSIS — R293 Abnormal posture: Secondary | ICD-10-CM

## 2016-09-12 DIAGNOSIS — M6281 Muscle weakness (generalized): Secondary | ICD-10-CM | POA: Insufficient documentation

## 2016-09-12 NOTE — Therapy (Signed)
[facility] [address], [place], [place], [address] Phone: [phone]   Fax:  [phone]  Physical Therapy Evaluation  Patient Details  Name: Margaret Li MRN: [id number] Date of Birth: [birth year] Referring Provider: Delrae Rend, MD  Encounter Date: 09/12/2016      PT End of Session - 09/12/16 0921    Visit Number 1   Number of Visits 10   Date for PT Re-Evaluation 10/10/16   PT Start Time 0846   PT Stop Time 0920   PT Time Calculation (min) 34 min   Activity Tolerance Patient tolerated treatment well   Behavior During Therapy [facility] for tasks assessed/performed      Past Medical History:  Diagnosis Date  . Black tarry stools    "per patient"  . Glaucoma   . Hypertension   . Hypothyroidism     Past Surgical History:  Procedure Laterality Date  . ABDOMINAL HYSTERECTOMY     80's  . CATARACT EXTRACTION, BILATERAL    . ESOPHAGOGASTRODUODENOSCOPY (EGD) WITH PROPOFOL N/A 08/04/2016   Procedure: ESOPHAGOGASTRODUODENOSCOPY (EGD) WITH PROPOFOL;  Surgeon: Carol Ada, MD;  Location: [facility] END[facility];  Service: Endoscopy;  Laterality: N/A;  . HERNIA REPAIR Right    Inguinal hernia repair '12 -Dr. Rise Patience  . JOINT REPLACEMENT Right    -RTHA -4 '11 - Dr. [name]- [facility]    There were no vitals filed for this visit.       Subjective Assessment - 09/12/16 0829    Subjective Pt presents to PT with diagnosis of osteoporosis with history of L1 compression.     Pertinent History L1 compression fracture, Rt ankle fracture (1990s)   How long can you walk comfortably? Pt walks 2 miles 2-3x/wk for exercise   Diagnostic tests bone density scan   Patient Stated Goals learn ways to exercise and manage osteoporosis   Currently in Pain? No/denies  intermittent LBP with standing and walking long periods            [facility] PT Assessment - 09/12/16 0001      Assessment   Medical Diagnosis osteoporosis, vertebral  compression fracture   Referring Provider Delrae Rend, MD   Onset Date/Surgical Date 09/12/09   Prior Therapy none     Precautions   Precautions Other (comment)  osteoporosis- avoid flexion, no joint mobs     Restrictions   Weight Bearing Restrictions No     Balance Screen   Has the patient fallen in the past 6 months No   Has the patient had a decrease in activity level because of a fear of falling?  No   Is the patient reluctant to leave their home because of a fear of falling?  No     Home Environment   Living Environment Private residence   Living Arrangements Alone   Type of [place] Access Level entry   Home Layout One level     Prior Function   Level of [place] Retired   Leisure walks for exercise     Cognition   Overall Cognitive Status Within Functional Limits for tasks assessed     Posture/Postural Control   Posture/Postural Control Postural limitations   Postural Limitations Forward head;Rounded Shoulders   Posture Comments kyphosis     ROM / Strength   AROM / PROM / Strength AROM;Strength     AROM   Overall AROM  Within functional limits for tasks performed   Overall  AROM Comments all AROM is [facility] without pain     Strength   Overall Strength Within functional limits for tasks performed   Overall Strength Comments 4+/5 UE and LE strength throughout     Palpation   Palpation comment NA     Transfers   Transfers Sit to Stand;Stand to Sit   Sit to Stand 6: Modified independent (Device/Increase time)     Ambulation/Gait   Ambulation/Gait Yes   Ambulation Distance (Feet) 100 Feet   Gait Pattern Step-through pattern                           PT Education - 09/12/16 0908    Education provided Yes   Education Details HEP, posture, osteoporosos information   Person(s) Educated Patient   Methods Explanation;Demonstration;Handout   Comprehension Verbalized understanding;Returned demonstration              PT Long Term Goals - 09/12/16 0827      PT LONG TERM GOAL #1   Title be independent in advanced HEP for osteoporosis including strength and weight bearing activity   Time 4   Period Weeks   Status New     PT LONG TERM GOAL #2   Title verbalize and demonstrate correct body mechanics for home and work tasks to decrease strain on the spine   Time 4   Period Weeks   Status New     PT LONG TERM GOAL #3   Title verbally undetrstand [employer] and donts of osteporosis management to reduce risk of fracture   Time 4   Period Weeks   Status New               Plan - 09/12/16 [id number]    Clinical Impression Statement Pt presents to PT with diagnosis of osteoporosis (unsure of t- score).  MD wants pt to received osteoporosis education for strength, posture and weightbearing exercise.  Pt demonstrates forward head, rounded shoulder and thoracic kyphosis.  Pt demonstrates postural weakness.  Pt has not received education regarding positions to avoid to prevent fracture.  Pt will benefit from skilled PT for postural strength, weight bearing exercise, and education regarding body mechanics and positions to avoid.    Rehab Potential Good   PT Frequency 1x / week   PT Duration 4 weeks   PT Treatment/Interventions ADLs/Self Care Home Management;Functional mobility training;Moist Heat;Cryotherapy;Therapeutic activities;Therapeutic exercise;Neuromuscular re-education;Patient/family education;Manual techniques   PT Next Visit Plan Advance HEP for postural strength, [place] Decompression, weightbearing exercise, body mechanics education   Consulted and Agree with Plan of Care Patient      Patient will benefit from skilled therapeutic intervention in order to improve the following deficits and impairments:  Postural dysfunction, Decreased strength, Improper body mechanics  Visit Diagnosis: Abnormal posture - Plan: PT plan of care cert/re-cert  Muscle weakness (generalized) - Plan: PT  plan of care cert/re-cert      G-Codes - [id number]    Functional Assessment Tool Used clinical judgement   Functional Limitation Other PT primary   Other PT Primary Current Status [id number]) At least 1 percent but less than 20 percent impaired, limited or restricted   Other PT Primary Goal Status [id number]) At least 1 percent but less than 20 percent impaired, limited or restricted       Problem List Patient Active Problem List   Diagnosis Date Noted  . Acute blood loss anemia 06/14/2015  . Essential hypertension 06/14/2015  .  Hypothyroidism 06/14/2015  . Glaucoma 06/14/2015  . Rectal bleeding 06/13/2015     [name], PT 09/12/16 9:28 AM  [facility] [address] [place], [place], [address] Phone: [phone]   Fax:  [phone]  Name: Margaret Li MRN: [id number] Date of Birth: [birth year]

## 2016-09-12 NOTE — Patient Instructions (Addendum)
Posture - Standing   Good posture is important. Avoid slouching and forward head thrust. Maintain curve in low back and align ears over shoulders, hips over ankles.  Pull your belly button in toward your back bone. Posture Tips DO: - stand tall and erect - keep chin tucked in - keep head and shoulders in alignment - check posture regularly in mirror or large window - pull head back against headrest in car seat;  Change your position often.  Sit with lumbar support. DON'T: - slouch or slump while watching TV or reading - sit, stand or lie in one position  for too long;  Sitting is especially hard on the spine so if you sit at a desk/use the computer, then stand up often! Copyright  VHI. All rights reserved.  Posture - Sitting  Sit upright, head facing forward. Try using a roll to support lower back. Keep shoulders relaxed, and avoid rounded back. Keep hips level with knees. Avoid crossing legs for long periods. Copyright  VHI. All rights reserved.  Chronic neck strain can develop because of poor posture and faulty work habits  Postural strain related to slumped sitting and forward head posture is a leading cause of headaches, neck and upper back pain  General strengthening and flexibility exercises are helpful in the treatment of neck pain.  Most importantly, you should learn to correct the posture that may be contributing to chronic pain.   Change positions frequently  Change your work or home environment to improve posture and mechanics.         Resisted Horizontal Abduction: Bilateral   Sit or stand, tubing in both hands, arms out in front. Keeping arms straight, pinch shoulder blades together and stretch arms out. Repeat _10___ times per set. Do 2____ sets per session. Do _1-2___ sessions per day.    Scapular Retraction: Elbow Flexion (Standing)   With elbows bent to 90, pinch shoulder blades together and rotate arms out, keeping elbows bent. Repeat _10___ times per  set. Do _1___ sets per session. Do many____ sessions per day.        Osteoporosis   What is Osteoporosis?  - A silent disease in which the skeleton is weakened by decreased bone density. - Characterized by low bone mass, deterioration of bone, and increased risk of fracture postmenopausal (primary) or the result of an identifiable condition/event (secondary) - Commonly found in the wrists, spine, and hips; these are high-risk stress areas and very susceptible to fractures.  The Facts: - There are 1.5 million fractures/year o 500,000 spine; 250,000 hip with over 60,000 nursing home admissions secondary to hip fracture; and 200,000 wrist - After hip fracture, only 50% of people able to walk independently prior to the fracture return to independent ambulation. - Bone mass: Peaks at age 104-30, and begins declining at age 26-50.   Osteoporosis is defined by the [facility]) as:  NOF/WHO Criteria for Interpreting Results of Bone Density Assessment  Results Diagnosis  Within 1 standard deviation (SD) of young adult mean Normal  Between 1 and -2.5 SD below mean, repeat in 2 years Low bone mass (osteopenia)  Greater than -2.5 SD below mean Osteoporosis  Greater than -2.5 SD below mean and one or more fragility fractures exist Severe Osteoporosis  *Results can be affected by positioning of the body in the DEXA scan, presence of current or old fractures, arthritis, extraneous calcifications.    Osteoporosis is not just a women's disease!  - 30-40% of women will  develop osteoporosis - 5-15% of males will develop osteoporosis   What are the risk factors?  1. Female 2. Thin, small frame 3. Caucasian, Asian race 4. Early menopause (<31 years old)/amenorrhea/delayed puberty 58. Old age 75. Family history (fractures, stooped posture)\ 7. Low calcium diet 8. Sedentary lifestyle 9. Alcohol, Caffeine, Smoking 10. Malnutrition, GI Disease 11. Prolonged use of  Glucocorticoids (Prednisone), Meds to treat asthma, arthritis, cancers, thyroid, and anti-seizure meds.  How do I know for sure?  Get a BONE DENSITY TEST!  This measures bone loss and it's painless, non-invasive, and only takes 5-10 minutes!  What can I do about it?  ? Decrease your risk factors (alcohol, caffeine, smoking) ? Helpful medications (see next page) ? Adequate Calcium and Vitamin D intake ? Get active! o Proper posture - Sit and stand tall! No slouching or twisting o Weight-Bearing Exercise - walking, stair climbing, elliptical; NO jogging or high-impact exercise. o Resistive Exercise - Cybex weight equipment, Nautilus, dumbbells, therabands  **Be sure to maintain proper alignment when lifting any weight!!  **When using equipment, avoid abdominal exercises which involve "crunching" or curling or twisting the trunk, biceps machines, cross-country machines, moving handlebars, or ANY MACHINE WITH ROTATION OR FORWARD BENDING!!!           Approved Pharmacologic Management of Osteoporosis  Agent Approved for prevention Approved for treatment BMD increased spine/hip Fracture reduction  Estrogen/Hormone Therapy (Estrace, Estratab, Ogen, Premarin, Vivell, Prempro, Femhert, Orthoest) Yes Yes 3-6% 35% spine and hip  Bisphosphonates  (Fosamax, Actonel, Boniva) Yes Yes 3-8% 35-50% spine and non-spine  Calcitonin (Miacalcin, Calcimar, Fortical) No Yes 0-3% None stated  Raloxifene (Evista) Yes Yes 2-3% 30-55%  Parathyroid Hormone (Forteo) No Yes, only in those at high risk for fracture None stated 53-65%     Recommended Daily Calcium Intakes   Population Group NIH/NOF* (mg elemental calcium)  Children 1-10 years [phone]  Children 11-24 years 26-1500  Men and Women 25-64 years At least 1200  Pregnant/Lactating At least 1200  Postmenopausal women with hormone replacement therapy At least 1200  Postmenopausal women without   hormone replacement therapy At least  1200  Men and women 65 + At least 1200  *In 1987, 1990, 1994, and 2000, the NIH held consensus conferences on osteoporosis and calcium.  This column shows the most recent recommendations regarding calcium intak for preventing and managing osteoporosis.          Calcium Content of Selected Foods  Dairy Foods Calcium Content (mg) Non-Dairy Foods Calcium Content (mg)  Buttermilk, 1 cup 300 Calcium-fortified juice, 1 cup 300  Milk, 1 cup 300 Salmon, canned with Bones, 2 oz 100  Lactaid milk, 1 cup 300-500 Oysters, raw 13-19 medium 226  Soy milk, 1 cup 200-300 Sardines, canned with bones, 3 oz 372  Yogurt (plain, lowfat) 1 cup 250-300 Shrimp, canned 3 oz 98  Frozen yogurt (fruit) 1 cup 200-600 Collard greens, cooked 1 cup 357  Cheddar, mozzarella, or [place] cheese, 1oz 205 Broccoli, cooked 1 cup 78  Cottage cheese (lowfat) 4 oz 200 Soybeans, cooked 1 cup 131  Part-skim ricotta cheese, 4oz 335 Tofu, 4oz* *  Vanilla ice cream, 1 cup 120-300    *Calcium content of tofu varies depending on processing method; check nutritional label on package for precise calcium content.     Suggested Guidelines for Calcium Supplement Use:  ? Calcium is absorbed most efficiently if taken in small amounts throughout the day.  Always divide the daily dose into smaller amounts if  the total daily dose is 500mg  or more per day.  The body cannot use more than 500mg  Calcium at any one time. ? The use of manufactured supplements is encouraged.  Calcium as bone meal or dolomite may contain lead or other heavy metals as contaminants. ? Calcium supplements should not be taken with high fiber meals or with bulk forming laxatives. ? If calcium carbonate is used as the supplement form, it should be taken with meals to assure that stomach acid production is present to facilitate optimal dissolution and absorption of calcium.  This is important if atrophic gastritis with hypo- or achlorhydria is present,  which it is in 20-50% of older individuals. ? It is important to drink plenty of fluids while using the supplement to help reduce problems with side effects like constipation or bloating.  If these symptoms become a problem, switching to another form of supplement may be the answer. ? Another alternative is calcium-fortified foods, including fruit juices, cereals, and breads.  These foods are now marketed with added calcium and may be less likely to cause side effects. ? Those with personal or family histories of kidney stones should be monitored to assure that hypercalcuria does not occur. CALCIUM INTAKE QUIZ  Dairy products are the primary source of calcium for most people.  For a quick estimate of your daily calcium intake, complete the following steps:  1. Use the chart below to determine your daily intake of calcium from diary foods. Servings of dairy per day 1 2 3 4 5 6 7 8   Milligrams (mg) of calcium: 250 [phone] 1250 1500 1750 2000   2.  Enter your total daily calcium intake from dairy foods:     _____mg  3.  Add 350 mg, which is the average for all other dietary sources:                 +            350 mg  4.  The sum of your total daily calcium intake:               ______mg  5.  Enter the recommended calcium intake for your age from the chart below;         ______mg  6.  Enter your daily intake from step 4 above and subtract:                             -        _______mg  7.  The result is how much additional calcium you need:                                          ______mg      Recommended Daily Calcium Intake  Population Calcium (mg)  Children 1-10 years [phone]  Children 11-24 years 73-1500  Men and women 25-64 At least 1200  Pregnant/Lactating At least 1200  Postmenopausal women with hormone replacement therapy At least 1200  Postmenopausal women without hormone replacement therapy At least 1200  Men and women 65+ At least 1200       SAFETY TIPS  FOR FALL PREVENTION   1. Remove throw rugs and make certain carpet edges are securely fastened to the floor.  2. Reduce clutter, especially in traffic areas of the home.  3. Install/maintain sturdy handrails at stairs.  4. Increase wattage of lighting in hallways, bathrooms, kitchens, stairwells, and entrances to home.  5. Use night-lights near bed, in hallways, and in bathroom to improve night safety.  6. Install safety handrails in shower, tub, and around toilet.  Bathtubs and shower stalls should have non-skid surfaces.  7. When you must reach for something high, use a safety step stool, one with wide steps and a friction surface to stand on.  A type equipped with a high handrail is preferred.  8. If a cane or other walking aid has been recommended, use it to help increase your stability.  9. Wear supportive, cushioned, low-heeled shoes.  Avoid "scuffs" (backless bedroom slippers) and high heels.  10. Avoid rushing to answer a phone, doorbell, or anything else!  A portable phone that you can take from room to room with you is a good idea for security and safety.  11. Exercise regularly and stay active!!    [place] [email or web address]   Exercise for Osteoporosis; A Safe and Effective Way to Build Bone Density and Muscle Strength By: Burnard Hawthorne, M.A. [place] [address] [place], [place] [address] Phone # [phone] Fax [phone]

## 2016-09-19 ENCOUNTER — Ambulatory Visit: Payer: Medicare Other

## 2016-09-19 DIAGNOSIS — R293 Abnormal posture: Secondary | ICD-10-CM | POA: Diagnosis not present

## 2016-09-19 DIAGNOSIS — M6281 Muscle weakness (generalized): Secondary | ICD-10-CM | POA: Diagnosis not present

## 2016-09-19 NOTE — Patient Instructions (Addendum)
DO's and DON'T's   Avoid and/or Minimize positions of forward bending ( flexion)  Side bending and rotation of the trunk  Especially when movements occur together   When your back aches:   Don't sit down   Lie down on your back with a small pillow under your head and one under your knees or as outlined by our therapist. Or, lie in the 90/90 position ( on the floor with your feet and legs on the sofa with knees and hips bent to 90 degrees)  Tying or putting on your shoes:   Don't bend over to tie your shoes or put on socks.  Instead, bring one foot up, cross it over the opposite knee and bend forward (hinge) at the hips to so the task.  Keep your back straight.  If you cannot do this safely, then you need to use long handled assistive devices such as a shoehorn and sock puller.  Exercising:  Don't engage in ballistic types of exercise routines such as high-impact aerobics or jumping rope  Don't do exercises in the gym that bring you forward (abdominal crunches, sit-ups, touching your  toes, knee-to-chest, straight leg raising.)  Follow a regular exercise program that includes a variety of different weight-bearing activities, such as low-impact aerobics, T' ai chi or walking as your physical therapist advises  Do exercises that emphasize return to normal body alignment and strengthening of the muscles that keep your back straight, as outlined in this program or by your therapist  Household tasks:  Don't reach unnecessarily or twist your trunk when mopping, sweeping, vacuuming, raking, making beds, weeding gardens, getting objects ou of cupboards, etc.  Keep your broom, mop, vacuum, or rake close to you and mover your whole body as you move them. Walk over to the area on which you are working. Arrange kitchen, bathroom, and bedroom shelves so that frequently used items may be reached without excessive bending, twisting, and reaching.  Use a  sturdy stool if necessary.  Don't bend from the waist to pick up something up  Off the floor, out of the trunk of your car, or to brush your teeth, wash your face, etc.   Bend at the knees, keeping back straight as possible. Use a reacher if necessary.   Prevention of fracture is the so-called "BOTTOm -Line" in the management of OSTEOPOROSIS. Do not take unnecessary chances in movement. Once a compression fracture occurs, the process is very difficult to control; one fracture is frequently followed by many more.      Lifting Principles  .Maintain proper posture and head alignment. .Slide object as close as possible before lifting. .Move obstacles out of the way. .Test before lifting; ask for help if too heavy. .Tighten stomach muscles without holding breath. .Use smooth movements; do not jerk. .Use legs to do the work, and pivot with feet. .Distribute the work load symmetrically and close to the center of trunk. .Push instead of pull whenever possible.   Squat down and hold basket close to stand. Use leg muscles to do the work.    Avoid twisting or bending back. Pivot around using foot movements, and bend at knees if needed when reaching for articles.        Getting Into / Out of Bed   Lower self to lie down on one side by raising legs and lowering head at the same time. Use arms to assist moving without twisting. Bend both knees to roll onto back if desired. To sit up,   from lying on side, and use same move-ments in reverse. Keep trunk aligned with legs.    Shift weight from front foot to back foot as item is lifted off shelf.    When leaning forward to pick object up from floor, extend one leg out behind. Keep back straight. Hold onto a sturdy support with other hand.      Sit upright, head facing forward. Try using a roll to support lower back. Keep shoulders relaxed, and avoid rounded back. Keep hips level with knees. Avoid crossing legs for long  periods.    RE-ALIGNMENT ROUTINE EXERCISES-OSTEOPROROSIS BASIC FOR POSTURAL CORRECTION   RE-ALIGNMENT Tips BENEFITS: 1.It helps to re-align the curves of the back and improve standing posture. 2.It allows the back muscles to rest and strengthen in preparation for more activity. FREQUENCY: Daily, even after weeks, months and years of more advanced exercises. START: 1.All exercises start in the same position: lying on the back, arms resting on the supporting surface, palms up and slightly away from the body, backs of hands down, knees bent, feet flat. 2.The head, neck, arms, and legs are supported according to specific instructions of your therapist. Copyright  VHI. All rights reserved.    1. Decompression Exercise: Basic.   Takes compression off the vertebral bodies; increases tolerance for lying on the back; helps relieve back pain   Lie on back on firm surface, knees bent, feet flat, arms turned up, out to sides (~35 degrees). Head neck and arms supported as necessary. Time _5-15__ minutes. Surface: floor     2. Shoulder Press  Strengthens upper back extensors and scapular retractors.   Press both shoulders down. Hold _2-3__ seconds. Repeat _3-5__ times. Surface: floor        3. Head Press With [place]  Strengthens neck extensors   Tuck chin SLIGHTLY toward chest, keep mouth closed. Feel weight on back of head. Increase weight by pressing head down. Hold _2-3__ seconds. Relax. Repeat 3-5___ times. Surface: floor   4. Leg Lengthener: stretches quadratus lumborum and hip flexors.  Strengthens quads and ankle dorsiflexors. [place] [address] [place], [place] [address] Phone # [phone] Fax [phone]

## 2016-09-19 NOTE — Therapy (Signed)
[facility] [address] [place], [place], [address] Phone: [phone]   Fax:  [phone]  Physical Therapy Treatment  Patient Details  Name: Margaret Li MRN: [id number] Date of Birth: 07/24/1929 Referring Provider: Delrae Rend, MD  Encounter Date: 09/19/2016      PT End of Session - 09/19/16 0920    Visit Number 2   Number of Visits 10   Date for PT Re-Evaluation 10/10/16   PT Start Time [id number]  no pain, building HEP only   PT Stop Time 0920   PT Time Calculation (min) 33 min   Activity Tolerance Patient tolerated treatment well   Behavior During Therapy [facility] for tasks assessed/performed      Past Medical History:  Diagnosis Date  . Black tarry stools    "per patient"  . Glaucoma   . Hypertension   . Hypothyroidism     Past Surgical History:  Procedure Laterality Date  . ABDOMINAL HYSTERECTOMY     80's  . CATARACT EXTRACTION, BILATERAL    . ESOPHAGOGASTRODUODENOSCOPY (EGD) WITH PROPOFOL N/A 08/04/2016   Procedure: ESOPHAGOGASTRODUODENOSCOPY (EGD) WITH PROPOFOL;  Surgeon: Carol Ada, MD;  Location: [facility];  Service: Endoscopy;  Laterality: N/A;  . HERNIA REPAIR Right    Inguinal hernia repair '12 -Dr. Rise Patience  . JOINT REPLACEMENT Right    -RTHA -4 '11 - Dr. [name]- [facility]    There were no vitals filed for this visit.      Subjective Assessment - 09/19/16 0850    Subjective I've been doing my exercises.     How long can you walk comfortably? Pt walks 2 miles 2-3x/wk for exercise   Patient Stated Goals learn ways to exercise and manage osteoporosis   Currently in Pain? No/denies                         [facility] Adult PT Treatment/Exercise - 09/19/16 0001      Exercises   Exercises Shoulder;Neck     Neck Exercises: Supine   Other Supine Exercise decompression exercises      Shoulder Exercises: Seated   Row Strengthening;Both;20 reps   Horizontal ABduction  Strengthening;Both;20 reps;Theraband   Theraband Level (Shoulder Horizontal ABduction) Level 1 (Yellow)                PT Education - 09/19/16 0855    Education provided Yes   Education Details body mechanics, dos and donts of osteoporosis, decompression   Person(s) Educated Patient   Methods Explanation;Demonstration;Handout   Comprehension Verbalized understanding;Returned demonstration             PT Long Term Goals - 09/19/16 0850      PT LONG TERM GOAL #1   Title be independent in advanced HEP for osteoporosis including strength and weight bearing activity   Time 4   Period Weeks   Status On-going     PT LONG TERM GOAL #2   Title verbalize and demonstrate correct body mechanics for home and work tasks to decrease strain on the spine   Status Achieved               Plan - 09/19/16 0904    Clinical Impression Statement Pt has received body mechanics education today and verbalizes understanding of positions to avoid to prevent fracture.  PT discussed household tasks and how to modify.  PT also educated in decompression exercises and pt able to perform correctly.  Pt will  benefit from 1-2 sessions for instruction in HEP for weightbearing exercise and posural strength.     Rehab Potential Good   PT Frequency 1x / week   PT Duration 4 weeks   PT Treatment/Interventions ADLs/Self Care Home Management;Functional mobility training;Moist Heat;Cryotherapy;Therapeutic activities;Therapeutic exercise;Neuromuscular re-education;Patient/family education;Manual techniques   PT Next Visit Plan Review Meeks decompression, add weightbearing exercise (standing hip), finalize HEP with probable D/C   Consulted and Agree with Plan of Care Patient      Patient will benefit from skilled therapeutic intervention in order to improve the following deficits and impairments:  Postural dysfunction, Decreased strength, Improper body mechanics  Visit Diagnosis: Abnormal  posture  Muscle weakness (generalized)     Problem List Patient Active Problem List   Diagnosis Date Noted  . Acute blood loss anemia 06/14/2015  . Essential hypertension 06/14/2015  . Hypothyroidism 06/14/2015  . Glaucoma 06/14/2015  . Rectal bleeding 06/13/2015     [name], PT 09/19/16 9:24 AM  [facility] [address] [place], [place], [address] Phone: [phone]   Fax:  [phone]  Name: Margaret Li MRN: [id number] Date of Birth: 22-Dec-1928

## 2016-09-26 ENCOUNTER — Ambulatory Visit: Payer: Medicare Other

## 2016-09-26 DIAGNOSIS — M6281 Muscle weakness (generalized): Secondary | ICD-10-CM | POA: Diagnosis not present

## 2016-09-26 DIAGNOSIS — R293 Abnormal posture: Secondary | ICD-10-CM | POA: Diagnosis not present

## 2016-09-26 NOTE — Therapy (Signed)
[facility] [address] [place], [place], [address] Phone: [phone]   Fax:  [phone]  Physical Therapy Treatment  Patient Details  Name: Margaret Li MRN: [id number] Date of Birth: 08-06-1929 Referring Provider: Delrae Rend, MD  Encounter Date: 09/26/2016      PT End of Session - 09/26/16 0916    Visit Number 3   PT Start Time 0850  no pain, building HEP   PT Stop Time 0920   PT Time Calculation (min) 30 min   Activity Tolerance Patient tolerated treatment well   Behavior During Therapy [facility] for tasks assessed/performed      Past Medical History:  Diagnosis Date  . Black tarry stools    "per patient"  . Glaucoma   . Hypertension   . Hypothyroidism     Past Surgical History:  Procedure Laterality Date  . ABDOMINAL HYSTERECTOMY     80's  . CATARACT EXTRACTION, BILATERAL    . ESOPHAGOGASTRODUODENOSCOPY (EGD) WITH PROPOFOL N/A 08/04/2016   Procedure: ESOPHAGOGASTRODUODENOSCOPY (EGD) WITH PROPOFOL;  Surgeon: Carol Ada, MD;  Location: [facility];  Service: Endoscopy;  Laterality: N/A;  . HERNIA REPAIR Right    Inguinal hernia repair '12 -Dr. Rise Patience  . JOINT REPLACEMENT Right    -RTHA -4 '11 - Dr. [name]- [facility]    There were no vitals filed for this visit.      Subjective Assessment - 09/26/16 0853    Subjective I am ready to discharge to HEP.  I have been watching my body mechanics.     Pertinent History L1 compression fracture, Rt ankle fracture (1990s)   Currently in Pain? No/denies                         [facility] Adult PT Treatment/Exercise - 09/26/16 0001      Exercises   Exercises Knee/Hip     Neck Exercises: Supine   Other Supine Exercise decompression exercises      Knee/Hip Exercises: Standing   Hip Flexion Stengthening;Both;2 sets;10 reps   Hip Abduction Stengthening;Both;2 sets;10 reps   Hip Extension Stengthening;Both;2 sets;10 reps     Shoulder  Exercises: Seated   Row Strengthening;Both;20 reps   Horizontal ABduction Strengthening;Both;20 reps;Theraband   Theraband Level (Shoulder Horizontal ABduction) Level 1 (Yellow)                PT Education - 09/26/16 0857    Education provided Yes   Education Details standing hip exercises   Person(s) Educated Patient   Methods Explanation;Demonstration;Handout   Comprehension Verbalized understanding;Returned demonstration             PT Long Term Goals - 09/26/16 0858      PT LONG TERM GOAL #1   Title be independent in advanced HEP for osteoporosis including strength and weight bearing activity   Status Achieved     PT LONG TERM GOAL #2   Title verbalize and demonstrate correct body mechanics for home and work tasks to decrease strain on the spine   Status Achieved     PT LONG TERM GOAL #3   Title verbally undetrstand the dos and donts of osteporosis management to reduce risk of fracture   Status Achieved               Plan - 09/26/16 0905    Clinical Impression Statement Pt has comprehensive HEP for postural strength, weight bearing exercise and decompression exercise.  Pt has received  body mechanics education and osteoporosis education.  Pt will be discharged to HEP.     PT Next Visit Plan D/C PT to HEP today   Consulted and Agree with Plan of Care Patient      Patient will benefit from skilled therapeutic intervention in order to improve the following deficits and impairments:     Visit Diagnosis: Abnormal posture  Muscle weakness (generalized)       G-Codes - October 18, 2016 0858    Functional Assessment Tool Used clinical judgement   Functional Limitation Other PT primary   Other PT Primary Goal Status (M7672) At least 1 percent but less than 20 percent impaired, limited or restricted   Other PT Primary Discharge Status (C9470) At least 1 percent but less than 20 percent impaired, limited or restricted      Problem List Patient Active  Problem List   Diagnosis Date Noted  . Acute blood loss anemia 06/14/2015  . Essential hypertension 06/14/2015  . Hypothyroidism 06/14/2015  . Glaucoma 06/14/2015  . Rectal bleeding 06/13/2015    PHYSICAL THERAPY DISCHARGE SUMMARY  Visits from Start of Care: 3  Current functional level related to goals / functional outcomes: See above for current status.     Remaining deficits: No deficits remain.   Education / Equipment: HEP, posture/body mechanics, osteoporosis Plan: Patient agrees to discharge.  Patient goals were met. Patient is being discharged due to meeting the stated rehab goals.  ?????        Margaret Li, PT 10-18-2016 9:18 AM  [facility] [address], [place], [place], [address] Phone: [phone]   Fax:  [phone]  Name: Margaret Li MRN: [id number] Date of Birth: 1928/11/13

## 2016-09-26 NOTE — Patient Instructions (Addendum)
Knee High   Holding stable object, raise knee to hip level, then lower knee. Repeat with other knee. Complete __10_ repetitions. Do __2__ sessions per day.  ABDUCTION: Standing (Active)   Stand, feet flat. Lift right leg out to side. Use _0__ lbs. Complete __10_ repetitions. Perform __2_ sessions per day.    EXTENSION: Standing (Active)  Stand, both feet flat. Draw right leg behind body as far as possible. Use 0___ lbs. Complete 10 repetitions. Perform __2_ sessions per day.  Copyright  VHI. All rights reserved.   [facility] [address], [address] [place], [place] [address] Phone # [phone] Fax [phone] 

## 2016-10-02 DIAGNOSIS — M810 Age-related osteoporosis without current pathological fracture: Secondary | ICD-10-CM | POA: Diagnosis not present

## 2016-10-02 DIAGNOSIS — Z1231 Encounter for screening mammogram for malignant neoplasm of breast: Secondary | ICD-10-CM | POA: Diagnosis not present

## 2016-10-02 DIAGNOSIS — Z803 Family history of malignant neoplasm of breast: Secondary | ICD-10-CM | POA: Diagnosis not present

## 2016-10-12 DIAGNOSIS — D500 Iron deficiency anemia secondary to blood loss (chronic): Secondary | ICD-10-CM | POA: Diagnosis not present

## 2016-10-19 DIAGNOSIS — M810 Age-related osteoporosis without current pathological fracture: Secondary | ICD-10-CM | POA: Diagnosis not present

## 2016-10-19 DIAGNOSIS — I10 Essential (primary) hypertension: Secondary | ICD-10-CM | POA: Diagnosis not present

## 2016-10-19 DIAGNOSIS — I700 Atherosclerosis of aorta: Secondary | ICD-10-CM | POA: Diagnosis not present

## 2016-10-19 DIAGNOSIS — E039 Hypothyroidism, unspecified: Secondary | ICD-10-CM | POA: Diagnosis not present

## 2016-10-19 DIAGNOSIS — R1031 Right lower quadrant pain: Secondary | ICD-10-CM | POA: Diagnosis not present

## 2016-10-24 DIAGNOSIS — B078 Other viral warts: Secondary | ICD-10-CM | POA: Diagnosis not present

## 2016-10-24 DIAGNOSIS — E039 Hypothyroidism, unspecified: Secondary | ICD-10-CM | POA: Diagnosis not present

## 2016-10-24 DIAGNOSIS — I10 Essential (primary) hypertension: Secondary | ICD-10-CM | POA: Diagnosis not present

## 2016-11-01 DIAGNOSIS — K921 Melena: Secondary | ICD-10-CM | POA: Diagnosis not present

## 2016-11-01 DIAGNOSIS — R1031 Right lower quadrant pain: Secondary | ICD-10-CM | POA: Diagnosis not present

## 2016-11-16 DIAGNOSIS — H40053 Ocular hypertension, bilateral: Secondary | ICD-10-CM | POA: Diagnosis not present

## 2016-11-16 DIAGNOSIS — H401133 Primary open-angle glaucoma, bilateral, severe stage: Secondary | ICD-10-CM | POA: Diagnosis not present

## 2016-11-16 DIAGNOSIS — H401431 Capsular glaucoma with pseudoexfoliation of lens, bilateral, mild stage: Secondary | ICD-10-CM | POA: Diagnosis not present

## 2016-11-29 DIAGNOSIS — R1031 Right lower quadrant pain: Secondary | ICD-10-CM | POA: Diagnosis not present

## 2016-11-29 DIAGNOSIS — D500 Iron deficiency anemia secondary to blood loss (chronic): Secondary | ICD-10-CM | POA: Diagnosis not present

## 2016-11-29 DIAGNOSIS — I10 Essential (primary) hypertension: Secondary | ICD-10-CM | POA: Diagnosis not present

## 2016-11-30 DIAGNOSIS — J309 Allergic rhinitis, unspecified: Secondary | ICD-10-CM | POA: Diagnosis not present

## 2016-11-30 DIAGNOSIS — I10 Essential (primary) hypertension: Secondary | ICD-10-CM | POA: Diagnosis not present

## 2016-11-30 DIAGNOSIS — R1031 Right lower quadrant pain: Secondary | ICD-10-CM | POA: Diagnosis not present

## 2016-11-30 DIAGNOSIS — E039 Hypothyroidism, unspecified: Secondary | ICD-10-CM | POA: Diagnosis not present

## 2016-11-30 DIAGNOSIS — E7800 Pure hypercholesterolemia, unspecified: Secondary | ICD-10-CM | POA: Diagnosis not present

## 2016-12-26 DIAGNOSIS — B078 Other viral warts: Secondary | ICD-10-CM | POA: Diagnosis not present

## 2017-02-01 DIAGNOSIS — R42 Dizziness and giddiness: Secondary | ICD-10-CM | POA: Diagnosis not present

## 2017-02-01 DIAGNOSIS — H547 Unspecified visual loss: Secondary | ICD-10-CM | POA: Diagnosis not present

## 2017-02-01 DIAGNOSIS — F411 Generalized anxiety disorder: Secondary | ICD-10-CM | POA: Diagnosis not present

## 2017-02-01 DIAGNOSIS — R209 Unspecified disturbances of skin sensation: Secondary | ICD-10-CM | POA: Diagnosis not present

## 2017-02-01 DIAGNOSIS — E039 Hypothyroidism, unspecified: Secondary | ICD-10-CM | POA: Diagnosis not present

## 2017-02-05 ENCOUNTER — Encounter: Payer: Self-pay | Admitting: Neurology

## 2017-02-09 ENCOUNTER — Encounter: Payer: Self-pay | Admitting: Neurology

## 2017-02-09 ENCOUNTER — Ambulatory Visit (INDEPENDENT_AMBULATORY_CARE_PROVIDER_SITE_OTHER): Payer: Medicare Other | Admitting: Neurology

## 2017-02-09 ENCOUNTER — Other Ambulatory Visit: Payer: Medicare Other

## 2017-02-09 VITALS — BP 108/60 | HR 68 | Temp 98.5°F | Resp 14 | Ht 64.5 in | Wt 127.2 lb

## 2017-02-09 DIAGNOSIS — H5462 Unqualified visual loss, left eye, normal vision right eye: Secondary | ICD-10-CM

## 2017-02-09 DIAGNOSIS — G629 Polyneuropathy, unspecified: Secondary | ICD-10-CM

## 2017-02-09 DIAGNOSIS — R441 Visual hallucinations: Secondary | ICD-10-CM | POA: Diagnosis not present

## 2017-02-09 NOTE — Patient Instructions (Addendum)
1. Bloodwork for ESR, CRP 2. Schedule MRI brain without contrast 3. Schedule carotid ultrasound 4. Recommend Balance therapy 5. Use cane at all times 6. Follow-up in 3 months

## 2017-02-09 NOTE — Progress Notes (Signed)
NEUROLOGY CONSULTATION NOTE  Margaret Li MRN: [id number] DOB: Dec 12, 1928  Referring provider: Dr. Darcus Austin Primary care provider: Dr. Darcus Austin  Reason for consult:  Left vision loss, dizziness, visual hallucinations  Dear Dr Inda Merlin:  Thank you for your kind referral of Margaret Li for consultation of the above symptoms. Although her history is well known to you, please allow me to reiterate it for the purpose of our medical record. TRRecords and images were personally reviewed where available.  HISTORY OF PRESENT ILLNESS: This is an 83 year old right-handed woman with a history of hypertension, hypothyroidism, presenting for evaluation of a transient episode of loss of vision on the left eye, dizziness, and visual hallucinations. She reports that last February she was writing something then suddenly could not see out of the left eye. It looked like things were bright, no scintillations, no headache. Her vision came back in less than 3 minutes. She denies any prior history of similar symptoms. She reports visual hallucinations of seeing her lost kitten. She lost her kitten in February, but since then she would be sitting in the den and out of the corner of her eye she sees the kitten on the floor. She would be sitting on the couch and see her kitten sitting then the vision disappears. She has seen this a number of times, one time she thought she heard meowing. She denies seeing any other visions or hearing voices. She has weird dreams of "some guy" chasing after her, then the next night she would have a continuation of that dream. She has been having dizziness on and off for the past 2 years, describing it as "wobbly." She has fallen twice, most recent fall was at the beginning of March. She denies any spinning sensation. She would have to stop and hold her head in her hand, the the sensation of wobbling would stop. She feels it calms her down. She denies feeling lightheaded. She used to  have bad headaches that resolved many years ago. She is now having a different type of headache "like static," occurring 2-3 times a month over the left temporal region. She then states it is not a headache but more of hearing the static noise. She reports having a headache a few months ago that did not last long. She has had burning pain on the right shin, "like holding a blaze" under her foot, sometimes she feels pins and needles. Her toes have been numb for years. Arms are unaffected. She denies any diplopia, dysarthria, she has had some swallowing difficulties but swallowing study was normal. She has neck and back pain, no bowel/bladder dysfunction.   PAST MEDICAL HISTORY: Past Medical History:  Diagnosis Date  . Black tarry stools    "per patient"  . Glaucoma   . Hypertension   . Hypothyroidism     PAST SURGICAL HISTORY: Past Surgical History:  Procedure Laterality Date  . ABDOMINAL HYSTERECTOMY     80's  . CATARACT EXTRACTION, BILATERAL    . ESOPHAGOGASTRODUODENOSCOPY (EGD) WITH PROPOFOL N/A 08/04/2016   Procedure: ESOPHAGOGASTRODUODENOSCOPY (EGD) WITH PROPOFOL;  Surgeon: Carol Ada, MD;  [facility];  Service: Endoscopy;  Laterality: N/A;  . HERNIA REPAIR Right    Inguinal hernia repair '12 -Dr. Rise Patience  . JOINT REPLACEMENT Right    [facility]    MEDICATIONS: Current Outpatient Prescriptions on File Prior to Visit  Medication Sig Dispense Refill  . bimatoprost (LUMIGAN) 0.01 % SOLN Place  1 drop into both eyes at bedtime.    . dorzolamide-timolol (COSOPT) 22.3-6.8 MG/ML ophthalmic solution Place 1 drop into both eyes 2 (two) times daily.    . fluticasone (FLONASE) 50 MCG/ACT nasal spray Place 1 spray into both nostrils daily as needed for allergies or rhinitis.    Marland Kitchen labetalol (NORMODYNE) 100 MG tablet Take 100 mg by mouth 2 (two) times daily.    Marland Kitchen LORazepam (ATIVAN) 2 MG tablet Take 2 mg by mouth at bedtime as needed for anxiety or sleep.       Marland Kitchen losartan (COZAAR) 50 MG tablet Take 100 mg by mouth daily.     . Lutein 20 MG TABS Take 20 mg by mouth daily.    . Multiple Vitamin (MULTIVITAMIN WITH MINERALS) TABS tablet Take 1 tablet by mouth daily.    . pantoprazole (PROTONIX) 40 MG tablet Take 1 tablet (40 mg total) by mouth daily. (Patient not taking: Reported on 08/04/2016) 30 tablet 2  . pilocarpine (PILOCAR) 2 % ophthalmic solution Place 1 drop into both eyes 2 (two) times daily.    . potassium chloride SA (K-DUR,KLOR-CON) 20 MEQ tablet Take 1 tablet (20 mEq total) by mouth daily. (Patient taking differently: Take 20 mEq by mouth daily. Not taking now) 3 tablet 0  . simvastatin (ZOCOR) 10 MG tablet Take 5 mg by mouth daily at 6 PM.     . SYNTHROID 25 MCG tablet Take 25 mcg by mouth daily.    . vitamin C (ASCORBIC ACID) 500 MG tablet Take 500 mg by mouth daily.     No current facility-administered medications on file prior to visit.     ALLERGIES: Allergies  Allergen Reactions  . Sulfa Antibiotics     Unknown reaction     FAMILY HISTORY: No family history on file.  SOCIAL HISTORY: Social History   Social History  . Marital status: Divorced    Spouse name: N/A  . Number of children: N/A  . Years of education: N/A   Occupational History  . Not on file.   Social History Main Topics  . Smoking status: Former Smoker    Quit date: 08/01/2011  . Smokeless tobacco: Never Used  . Alcohol use No  . Drug use: No  . Sexual activity: Not Currently   Other Topics Concern  . Not on file   Social History Narrative  . No narrative on file    REVIEW OF SYSTEMS: Constitutional: No fevers, chills, or sweats, no generalized fatigue, change in appetite Eyes: No visual changes, double vision, eye pain Ear, nose and throat: No hearing loss, ear pain, nasal congestion, sore throat Cardiovascular: No chest pain, palpitations Respiratory:  No shortness of breath at rest or with exertion, wheezes GastrointestinaI: No nausea,  vomiting, diarrhea, abdominal pain, fecal incontinence Genitourinary:  No dysuria, urinary retention or frequency Musculoskeletal:  + neck pain, back pain Integumentary: No rash, pruritus, skin lesions Neurological: as above Psychiatric: No depression, insomnia, anxiety Endocrine: No palpitations, fatigue, diaphoresis, mood swings, change in appetite, change in weight, increased thirst Hematologic/Lymphatic:  No anemia, purpura, petechiae. Allergic/Immunologic: no itchy/runny eyes, nasal congestion, recent allergic reactions, rashes  PHYSICAL EXAM: Vitals:   02/09/17 1404  BP: 108/60  Pulse: 68  Resp: 14  Temp: 98.5 F (36.9 C)   General: No acute distress Head:  Normocephalic/atraumatic Eyes: Visual acuity 20/40 OU, Fundoscopic exam shows bilateral sharp discs, no vessel changes, exudates, or hemorrhages Neck: supple, no paraspinal tenderness, full range of motion Back: No paraspinal  tenderness Heart: regular rate and rhythm Lungs: Clear to auscultation bilaterally. Vascular: No carotid bruits. Skin/Extremities: No rash, no edema Neurological Exam: Mental status: alert and oriented to person, place, and time, no dysarthria or aphasia, Fund of knowledge is appropriate.  Recent and remote memory are intact.  Attention and concentration are normal.    Able to name objects and repeat phrases. Cranial nerves: CN I: not tested CN II: pupils equal, round and reactive to light, visual fields intact, fundi unremarkable. CN III, IV, VI:  full range of motion, no nystagmus, no ptosis CN V: facial sensation intact CN VII: upper and lower face symmetric CN VIII: hearing intact to finger rub CN IX, X: gag intact, uvula midline CN XI: sternocleidomastoid and trapezius muscles intact CN XII: tongue midline Bulk & Tone: normal, no fasciculations. Motor: 5/5 throughout with no pronator drift. Sensation: intact to all modalities on both UE, decreased pin to both ankles, decreased vibration to  knees R>L. Romberg test positive sway Deep Tendon Reflexes: +2 on both UE, unable to elicit reflexes on both LE, no ankle clonus Plantar responses: downgoing bilaterally Cerebellar: no incoordination on finger to nose testing Gait: narrow-based and steady, mild difficulty with tandem walk but able Tremor: none  IMPRESSION: This is an 83 year old right-handed woman with a history of hypertension, hypothyroidism, presenting for evaluation of a transient episode of loss of vision on the left eye, dizziness, and visual hallucinations. Her neurological exam today shows intact vision, no focal deficits, with note of a length-dependent neuropathy, likely the cause of dizziness/wobbly sensation. She will be referred for Balance therapy and was advised to use her cane at all times. The transient loss of vision on the left eye is concerning for amaurosis fugax, carotid dopplers will be ordered. In this age group, would check ESR and CRP as well. She has had visual hallucinations specifically of seeing her lost kitten, MRI brain without contrast will be ordered to assess for underlying structural abnormality. She knows to go to the ER immediately for any change in symptoms, continue control of vascular risk factors. She is unable to take aspirin due to rectal bleeding. She will follow-up in 3 months and knows to call for any changes.   Thank you for allowing me to participate in the care of this patient. Please do not hesitate to call for any questions or concerns.   Ellouise Newer, M.D.  CC: Dr. Inda Merlin

## 2017-02-10 LAB — SEDIMENTATION RATE: Sed Rate: 1 mm/hr (ref 0–30)

## 2017-02-12 LAB — C-REACTIVE PROTEIN

## 2017-02-16 DIAGNOSIS — H401430 Capsular glaucoma with pseudoexfoliation of lens, bilateral, stage unspecified: Secondary | ICD-10-CM | POA: Diagnosis not present

## 2017-02-16 DIAGNOSIS — H401112 Primary open-angle glaucoma, right eye, moderate stage: Secondary | ICD-10-CM | POA: Diagnosis not present

## 2017-02-16 DIAGNOSIS — H401122 Primary open-angle glaucoma, left eye, moderate stage: Secondary | ICD-10-CM | POA: Diagnosis not present

## 2017-02-16 DIAGNOSIS — H40053 Ocular hypertension, bilateral: Secondary | ICD-10-CM | POA: Diagnosis not present

## 2017-02-17 DIAGNOSIS — G629 Polyneuropathy, unspecified: Secondary | ICD-10-CM | POA: Insufficient documentation

## 2017-02-17 DIAGNOSIS — H5462 Unqualified visual loss, left eye, normal vision right eye: Secondary | ICD-10-CM | POA: Insufficient documentation

## 2017-02-17 DIAGNOSIS — R441 Visual hallucinations: Secondary | ICD-10-CM | POA: Insufficient documentation

## 2017-02-19 ENCOUNTER — Ambulatory Visit (HOSPITAL_BASED_OUTPATIENT_CLINIC_OR_DEPARTMENT_OTHER)
Admission: RE | Admit: 2017-02-19 | Discharge: 2017-02-19 | Disposition: A | Discharge status: Home / Self Care | Payer: Medicare Other | Source: Ambulatory Visit | Attending: Neurology | Admitting: Neurology

## 2017-02-19 ENCOUNTER — Telehealth: Payer: Self-pay | Admitting: Neurology

## 2017-02-19 ENCOUNTER — Ambulatory Visit (HOSPITAL_COMMUNITY)
Admission: RE | Admit: 2017-02-19 | Discharge: 2017-02-19 | Disposition: A | Discharge status: Home / Self Care | Payer: Medicare Other | Source: Ambulatory Visit | Attending: Neurology | Admitting: Neurology

## 2017-02-19 DIAGNOSIS — I639 Cerebral infarction, unspecified: Secondary | ICD-10-CM | POA: Diagnosis not present

## 2017-02-19 DIAGNOSIS — R441 Visual hallucinations: Secondary | ICD-10-CM

## 2017-02-19 DIAGNOSIS — I6523 Occlusion and stenosis of bilateral carotid arteries: Secondary | ICD-10-CM | POA: Diagnosis not present

## 2017-02-19 DIAGNOSIS — H5462 Unqualified visual loss, left eye, normal vision right eye: Secondary | ICD-10-CM

## 2017-02-19 DIAGNOSIS — I6782 Cerebral ischemia: Secondary | ICD-10-CM | POA: Insufficient documentation

## 2017-02-19 DIAGNOSIS — I63412 Cerebral infarction due to embolism of left middle cerebral artery: Secondary | ICD-10-CM | POA: Insufficient documentation

## 2017-02-19 NOTE — Progress Notes (Signed)
VASCULAR LAB PRELIMINARY  PRELIMINARY  PRELIMINARY  PRELIMINARY  Carotid duplex completed.    Preliminary report:  Bilateral:  1-39% ICA stenosis.  Vertebral artery flow is antegrade.     Titan Karner, RVS 02/19/2017, 12:01 PM

## 2017-02-19 NOTE — Telephone Encounter (Signed)
I attempted to contact patient via phone today regarding the results of MRI brain which showed very small acute left occipital stroke, however there was no answer so a message was left for the patient to return my call.   Donika K. Posey Pronto, DO covering for Dr. Delice Lesch

## 2017-02-20 ENCOUNTER — Telehealth: Payer: Self-pay | Admitting: *Deleted

## 2017-02-20 ENCOUNTER — Telehealth: Payer: Self-pay | Admitting: Neurology

## 2017-02-20 LAB — VAS US CAROTID
LCCADSYS: -54 cm/s
LCCAPDIAS: 15 cm/s
LEFT ECA DIAS: -3 cm/s
LEFT VERTEBRAL DIAS: -14 cm/s
Left CCA dist dias: -18 cm/s
Left CCA prox sys: 54 cm/s
Left ICA dist dias: -32 cm/s
Left ICA dist sys: -101 cm/s
Left ICA prox dias: -10 cm/s
Left ICA prox sys: -23 cm/s
RCCADSYS: -44 cm/s
RCCAPDIAS: -8 cm/s
RCCAPSYS: -31 cm/s
RIGHT ECA DIAS: -5 cm/s
RIGHT VERTEBRAL DIAS: -8 cm/s

## 2017-02-20 NOTE — Telephone Encounter (Signed)
-----   Message from [place], DO sent at 02/20/2017  2:37 PM EDT ----- Let pt know that carotid u/s looked good

## 2017-02-20 NOTE — Telephone Encounter (Signed)
-----   Message from Pieter Partridge, DO sent at 02/19/2017  3:58 PM EDT ----- MRI of brain shows a tiny stroke.  It is likely an incidental finding and not the cause of the vision loss.  However, I recommend that she take an aspirin EC 81mg  daily

## 2017-02-20 NOTE — Telephone Encounter (Signed)
Patient given results and instructions.  Informed her that we will call her when we get the [place] results.

## 2017-02-20 NOTE — Telephone Encounter (Signed)
Patient made aware.

## 2017-02-20 NOTE — Telephone Encounter (Signed)
Left message on machine for patient to call back.  Awaiting call back to let patient know Carotid U/S okay.  [place].

## 2017-02-23 ENCOUNTER — Telehealth: Payer: Self-pay | Admitting: Neurology

## 2017-02-23 NOTE — Telephone Encounter (Signed)
I already spoke with patient and did not call her again.

## 2017-02-23 NOTE — Telephone Encounter (Signed)
Caller: Bonnita Nasuti  Urgent? No  Reason for the call: She was returning a call to the office. Thanks

## 2017-03-08 DIAGNOSIS — H401431 Capsular glaucoma with pseudoexfoliation of lens, bilateral, mild stage: Secondary | ICD-10-CM | POA: Diagnosis not present

## 2017-03-08 DIAGNOSIS — H40053 Ocular hypertension, bilateral: Secondary | ICD-10-CM | POA: Diagnosis not present

## 2017-03-08 DIAGNOSIS — H401132 Primary open-angle glaucoma, bilateral, moderate stage: Secondary | ICD-10-CM | POA: Diagnosis not present

## 2017-03-20 DIAGNOSIS — Z8781 Personal history of (healed) traumatic fracture: Secondary | ICD-10-CM | POA: Diagnosis not present

## 2017-03-20 DIAGNOSIS — M810 Age-related osteoporosis without current pathological fracture: Secondary | ICD-10-CM | POA: Diagnosis not present

## 2017-03-27 DIAGNOSIS — D500 Iron deficiency anemia secondary to blood loss (chronic): Secondary | ICD-10-CM | POA: Diagnosis not present

## 2017-04-18 ENCOUNTER — Other Ambulatory Visit: Payer: Self-pay

## 2017-04-18 DIAGNOSIS — I10 Essential (primary) hypertension: Secondary | ICD-10-CM | POA: Diagnosis not present

## 2017-04-18 DIAGNOSIS — F411 Generalized anxiety disorder: Secondary | ICD-10-CM | POA: Diagnosis not present

## 2017-04-18 DIAGNOSIS — M150 Primary generalized (osteo)arthritis: Secondary | ICD-10-CM | POA: Diagnosis not present

## 2017-04-18 DIAGNOSIS — G4700 Insomnia, unspecified: Secondary | ICD-10-CM | POA: Diagnosis not present

## 2017-04-18 DIAGNOSIS — I6990 Unspecified sequelae of unspecified cerebrovascular disease: Secondary | ICD-10-CM | POA: Diagnosis not present

## 2017-04-18 NOTE — Patient Outreach (Addendum)
[facility]) Care Management  04/18/2017  Margaret Li August 28, 1929 [id number]   Telephone Screen  Referral Date: 04/17/17 Referral Source: EMMI-Prevent Referral Reason: "lives alone, needs help with MOW's, needs help getting correct meds to help with sleep" Insurance: [place] attempt # 1 to patient. Spoke with patient and screening completed.   Social: Patient resides in her home alone. She states that she is having a "tough time" living alone and managing her care. She states she has a friend that assists out as needed. She is very concerned about her safety and living alone. States she had life alert a while back but never had to use it so discontinued it but now feels like it is something she could benefit from. She reports that she drives although she no longer still feels safe driving but doesn't have any other options. She states she was given a number to call for transportation assistance and contacted them. However, patient unable to recall name and rather or not she was set up with transportation services. Patient reports that she fell Sunday night out of the bed. She voices that the left side of her face/arm and hip are still sore. Denies any bruising. DME in the home include cane and shower chair. She voices that she is unable to shower/bathe as the light in the bathroom is out and she can not change the light bulb.    Conditions: Patient has PMH of "tiny" stroke, diverticulitis, OA,HTN, hypothyroidism and glaucoma.  She states that she is checking her BP in the home. Patient reports BP has been "up and down." She voices that she does become symptomatic at times and has issues with dizziness. Patient voices that her memory is worsening which is causing a lot of stress and anxiety for her. States she needs assistance on managing her chronic conditions.    Medications: Patient voices taking about four prescription meds and 2-3 OTC meds. Denies any  issues affording and/or managing her meds. Patient very concerned and wants to speak with someone regarding her issues with sleeping and the negative SEs she has had with multiple meds. States she was given Trazodone a week ago and took it. However, patient voices since taking meds she has not slept. She voices that med "did more damage" and having her feeling like she was going to "climb the walls." She wants to be placed on a safe sleep aide med.   Appointments: She has appt with PCP on today.   Advance Directives: Patient states that she believes she has living will.   Consent: [facility] Services reviewed and discussed. Patient gave verbal consent for services.   Plan: RN CM will notify [facility] administrative assistant of case status. RN CM will send referral to [facility] RN for further in home eval/assessment of care needs and management of chronic conditions. RN CM will send [facility] SW referral for possible assistance with community resources related to transportation and in home support. RN CM will send [facility] pharmacy referral for med assistance/review.     Enzo Montgomery, RN,BSN,CCM [place] Management Telephonic Care Management Coordinator Direct Phone: [phone] Toll Free: [phone] Fax: [phone]

## 2017-04-20 ENCOUNTER — Other Ambulatory Visit: Payer: Self-pay

## 2017-04-20 NOTE — Patient Outreach (Signed)
[facility]) Care Management  04/20/2017  Margaret Li 01/11/1929 [id number]  Care Coordination: RNCM received referral from telephonic care coordinator. Client with history of stroke, HTN, glaucoma, hypothyroid.  RNCM called to schedule home visit. [name] reports she has a visit scheduled with primary care next week. Client states she has called [employer] for transportation to her appointment next week. RNCM encouraged client to follow up next week to confirm she is on the list.  Plan: Home visit next week.  Thea Silversmith, RN, MSN, [place] Coordinator Cell: [phone]

## 2017-04-24 ENCOUNTER — Other Ambulatory Visit: Payer: Self-pay | Admitting: Licensed Clinical Social Worker

## 2017-04-24 NOTE — Patient Outreach (Signed)
[facility]) Care Management  04/24/2017  Margaret Li 03/11/1929 [id number]   Assessment- CSW received new referral on patient on 04/18/17 for transportation, personal care resources and community resources. CSW completed initial outreach attempt and was able to reach patient successfully. CSW introduced self, reason for call and of [facility] social work services. HIPPA verifications were received. Patient has a history of stroke, frequent falls, HTN, glaucoma and hypothyroid. CSW informed her that she would like to assist her with gaining stable transportation as she no longer feels comfortable driving. Patient reports that she completed part A of [employer] application and provided part B to her PCP who will complete and fax to the [employer] office. She shares that she also is aware of [employer] and used them to get to PCP appointment. Patient reports that she does not need assistance with gaining stable transportation as PCP is already assisting her. However, patient is agreeable to CSW monitoring process to ensure that [employer] services are successfully gained. Patient states that she is interested in gaining information on socialization opportunities within the community. CSW educated her on available day programs. CSW also reviewed personal care resources with patient over the phone. Patient does not wish to schedule home visit at this time stating "Too many people are calling me and coming out and it's getting me upset." CSW expressed understanding and patient is agreeable to CSW completing outreach call next week to see if she is agreeable to social work home visit.  Plan-CSW will follow up within one week. CSW will send involvement letter to PCP.  Margaret Li, BSW, MSW, [email or web address] Phone: [phone] Fax: [phone]

## 2017-04-25 ENCOUNTER — Other Ambulatory Visit: Payer: Self-pay

## 2017-04-25 NOTE — Patient Outreach (Addendum)
[facility]) Care Management   04/25/2017  NOTNAMED CROUCHER 21-Apr-1929 [id number]  Margaret Li is an 83 y.o. female  Subjective: client reports she needs someone to assist her to get better. Client is asking about the [place] enter and Meals on wheels.  Objective:  BP 134/90   Pulse 68   Resp 20   Ht 1.651 m (5\' 5" ) Comment: per visit summary April 18 2017  Wt 124 lb 12.8 oz (56.6 kg) Comment: per visit summary in April 18 2017  SpO2 98%   BMI 20.77 kg/m   Review of Systems  Respiratory:       Lungs clear.  Cardiovascular:       Heart rate regular    Physical Exam  Encounter Medications:   Outpatient Encounter Prescriptions as of 04/25/2017  Medication Sig Note  . aspirin EC 81 MG tablet Take 81 mg by mouth daily.   . bimatoprost (LUMIGAN) 0.01 % SOLN Place 1 drop into both eyes at bedtime.   . brinzolamide (AZOPT) 1 % ophthalmic suspension Place 1 drop into both eyes 2 (two) times daily.   . Cholecalciferol (VITAMIN D-3) 1000 units CAPS Take 1 capsule by mouth daily.   . fluticasone (FLONASE) 50 MCG/ACT nasal spray Place 1 spray into both nostrils daily as needed for allergies or rhinitis.   Marland Kitchen imiquimod (ALDARA) 5 % cream Apply 5 application topically as directed.   . labetalol (NORMODYNE) 100 MG tablet Take 100 mg by mouth 2 (two) times daily.   Marland Kitchen LORazepam (ATIVAN) 2 MG tablet Take 2 mg by mouth at bedtime as needed for anxiety or sleep.    Marland Kitchen losartan (COZAAR) 50 MG tablet Take 100 mg by mouth daily.    . Multiple Vitamin (MULTIVITAMIN WITH MINERALS) TABS tablet Take 1 tablet by mouth daily. 04/25/2017: Takes "occasionally"  . pilocarpine (PILOCAR) 2 % ophthalmic solution Place 1 drop into both eyes 2 (two) times daily.   . simvastatin (ZOCOR) 10 MG tablet Take 5 mg by mouth daily at 6 PM.    . SYNTHROID 25 MCG tablet Take 25 mcg by mouth daily.   . vitamin C (ASCORBIC ACID) 500 MG tablet Take 500 mg by mouth daily.   . dorzolamide-timolol (COSOPT) 22.3-6.8  MG/ML ophthalmic solution Place 1 drop into both eyes 2 (two) times daily.   . Lutein 20 MG TABS Take 20 mg by mouth daily.   . potassium chloride SA (K-DUR,KLOR-CON) 20 MEQ tablet Take 1 tablet (20 mEq total) by mouth daily. (Patient taking differently: Take 20 mEq by mouth daily. Not taking now)    No facility-administered encounter medications on file as of 04/25/2017.     Functional Status:   In your present state of health, do you have any difficulty performing the following activities: 04/25/2017  Hearing? N  Vision? N  Difficulty concentrating or making decisions? Y  Walking or climbing stairs? Y  Dressing or bathing? N  Doing errands, shopping? N  Preparing Food and eating ? Y  Using the Toilet? N  In the past six months, have you accidently leaked urine? Y  Do you have problems with loss of bowel control? Y  Managing your Medications? Y  Managing your Finances? N  Housekeeping or managing your Housekeeping? Y  Some recent data might be hidden    Fall/Depression Screening:    Fall Risk  04/18/2017 02/09/2017  Falls in the past year? Yes Yes  Number falls in past yr: 2 or  more 2 or more  Injury with Fall? No No  Risk Factor Category  High Fall Risk -  Risk for fall due to : History of fall(s);Impaired balance/gait;Impaired mobility;Medication side effect;Mental status change;Impaired vision -  Follow up Falls evaluation completed;Falls prevention discussed -   PHQ 2/9 Scores 04/25/2017 04/18/2017  PHQ - 2 Score 6 0  PHQ- 9 Score 16 -    Assessment: 83 year old with history of stroke, HTN, glaucoma, hypothyroid.  RNCM received referral from telephonic care coordinator.  RNCM completed home visit.   Recent weight loss. Client reports weight loss, but is unable to state approximate amount.She states she has an appetite and she has a couple of friends that brings her meals. She is not able to state reason for her weight loss. During home visit, meals on wheels delivered a  meal.  PHQ2 score 6/ PHQ-9 score 16. Client is acknowledging signs/symptoms of depression. Client has a handout about [facility] that she says she obtained herself. She reports she tried to go there to get some help one day, but got confused with direction and turned around and went home. Client denies wanting to harm herself or having a plan to harm self, but expresses, "I just want to diminish" and expresses desire to get help and get better. RNCM discussed [facility] social worker and encouraged client to speak with [facility] Social worker, Eula Fried. Client states she has spoken with her and has a planned call with Ms. Blanch Media next week and declines opportunity to schedule to see/talk with her sooner. RNCM spoke with Ms Blanch Media, social worker and updated her on clients expressing signs/symptoms of depression.  At risk to fall-client ambulates with a cane. She reports a history of a fall where she fell out of her bed, but states it was during a time when she took a new medication which she had a bad reaction to that included seeing things.   Personal care service needs, possible transportation needs, resources for depression. [place] work has been consulted.  Plan: update primary care, follow up call with client in 2-3 weeks. [facility] CM Care Plan Problem One     Most Recent Value  Care Plan Problem One  weight loss-alteration in nutrition  Role Documenting the Problem One  Care Management [place] for Problem One  Active  [facility] Long Term Goal   client will verbalize weight gain and/or no further weight loss within the next 30-45 days.  [facility] Long Term Goal Start Date  04/25/17  Interventions for Problem One Long Term Goal  discussed nutritional supplements, reviewed how client has been getting meals, discussed appetite  [facility] CM Short Term Goal #1   client will discuss use of nutritional supplements at next office visit.  [facility] CM Short Term Goal #1 Start Date  04/25/17  Interventions for Short Term  Goal #1  discussed possible nutritional supplements.  [facility] CM Short Term Goal #2   client will weigh/record weights at every 1-2 weeks.   [facility] CM Short Term Goal #2 Start Date  04/25/17  Interventions for Short Term Goal #2  discussed importance of nutrition and maintaining healthy weight.    [facility] CM Care Plan Problem Two     Most Recent Value  Care Plan Problem Two  at risk to fall due to possible medication changes, weakness  Role Documenting the Problem Two  Care Management [place] for Problem Two  Active  Interventions for Problem Two Long  Term Goal   provided and reviewed fall prevention booklet and discussed fall prevention strategies.  [place] Term Goal  client will verbalize at least 3 fall prevention strategies within the next 31-45 days.  [place] Long Term Goal Start Date  04/25/17      Thea Silversmith, RN, MSN, [place] Coordinator Cell: [phone]

## 2017-04-25 NOTE — Patient Outreach (Signed)
[facility]) Care Management  04/25/2017  MARTHA ELLERBY 1929/05/31 [id number]   Care Coordination: RNCM called Dr. Inda Merlin office. Left voice message with her nurse regarding home visit note routed; [facility] emphasized clients PHQ2 and PHQ and concern regarding weight loss.  Client with scheduled appointment for tomorrow.  Plan: continue to follow.  Thea Silversmith, RN, MSN, [place] Coordinator Cell: [phone]

## 2017-04-26 ENCOUNTER — Other Ambulatory Visit: Payer: Self-pay | Admitting: Licensed Clinical Social Worker

## 2017-04-26 ENCOUNTER — Emergency Department (HOSPITAL_COMMUNITY): Payer: Medicare Other

## 2017-04-26 ENCOUNTER — Emergency Department (HOSPITAL_COMMUNITY)
Admission: EM | Admit: 2017-04-26 | Discharge: 2017-04-26 | Disposition: A | Discharge status: Home / Self Care | Payer: Medicare Other | Attending: Emergency Medicine | Admitting: Emergency Medicine

## 2017-04-26 ENCOUNTER — Telehealth: Payer: Self-pay | Admitting: Pharmacist

## 2017-04-26 DIAGNOSIS — R45851 Suicidal ideations: Secondary | ICD-10-CM | POA: Diagnosis not present

## 2017-04-26 DIAGNOSIS — Z7982 Long term (current) use of aspirin: Secondary | ICD-10-CM | POA: Insufficient documentation

## 2017-04-26 DIAGNOSIS — Z79899 Other long term (current) drug therapy: Secondary | ICD-10-CM | POA: Insufficient documentation

## 2017-04-26 DIAGNOSIS — Z96641 Presence of right artificial hip joint: Secondary | ICD-10-CM | POA: Diagnosis not present

## 2017-04-26 DIAGNOSIS — F329 Major depressive disorder, single episode, unspecified: Secondary | ICD-10-CM | POA: Insufficient documentation

## 2017-04-26 DIAGNOSIS — R1032 Left lower quadrant pain: Secondary | ICD-10-CM

## 2017-04-26 DIAGNOSIS — K5730 Diverticulosis of large intestine without perforation or abscess without bleeding: Secondary | ICD-10-CM | POA: Diagnosis not present

## 2017-04-26 DIAGNOSIS — F32A Depression, unspecified: Secondary | ICD-10-CM

## 2017-04-26 DIAGNOSIS — I10 Essential (primary) hypertension: Secondary | ICD-10-CM | POA: Diagnosis not present

## 2017-04-26 DIAGNOSIS — N390 Urinary tract infection, site not specified: Secondary | ICD-10-CM | POA: Diagnosis not present

## 2017-04-26 DIAGNOSIS — E039 Hypothyroidism, unspecified: Secondary | ICD-10-CM | POA: Insufficient documentation

## 2017-04-26 DIAGNOSIS — Z87891 Personal history of nicotine dependence: Secondary | ICD-10-CM | POA: Insufficient documentation

## 2017-04-26 DIAGNOSIS — F332 Major depressive disorder, recurrent severe without psychotic features: Secondary | ICD-10-CM | POA: Diagnosis not present

## 2017-04-26 LAB — COMPREHENSIVE METABOLIC PANEL
ALBUMIN: 3.8 g/dL (ref 3.5–5.0)
ALT: 18 U/L (ref 14–54)
AST: 23 U/L (ref 15–41)
Alkaline Phosphatase: 45 U/L (ref 38–126)
Anion gap: 5 (ref 5–15)
BILIRUBIN TOTAL: 0.8 mg/dL (ref 0.3–1.2)
BUN: 24 mg/dL — AB (ref 6–20)
CHLORIDE: 106 mmol/L (ref 101–111)
CO2: 29 mmol/L (ref 22–32)
Calcium: 9 mg/dL (ref 8.9–10.3)
Creatinine, Ser: 0.83 mg/dL (ref 0.44–1.00)
GFR calc Af Amer: >60 mL/min (ref >60)
GFR calc non Af Amer: >60 mL/min (ref >60)
GLUCOSE: 96 mg/dL (ref 65–99)
POTASSIUM: 4.1 mmol/L (ref 3.5–5.1)
Sodium: 140 mmol/L (ref 135–145)
TOTAL PROTEIN: 6.2 g/dL — AB (ref 6.5–8.1)

## 2017-04-26 LAB — CBC WITH DIFFERENTIAL/PLATELET
Basophils Absolute: 0 10*3/uL (ref 0.0–0.1)
Basophils Relative: 0 %
EOS ABS: 0.1 10*3/uL (ref 0.0–0.7)
EOS PCT: 2 %
HCT: 32.4 % — ABNORMAL LOW (ref 36.0–46.0)
Hemoglobin: 10.7 g/dL — ABNORMAL LOW (ref 12.0–15.0)
LYMPHS ABS: 0.7 10*3/uL (ref 0.7–4.0)
Lymphocytes Relative: 14 %
MCH: 31.5 pg (ref 26.0–34.0)
MCHC: 33 g/dL (ref 30.0–36.0)
MCV: 95.3 fL (ref 78.0–100.0)
MONOS PCT: 15 %
Monocytes Absolute: 0.7 10*3/uL (ref 0.1–1.0)
Neutro Abs: 3.3 10*3/uL (ref 1.7–7.7)
Neutrophils Relative %: 69 %
PLATELETS: 191 10*3/uL (ref 150–400)
RBC: 3.4 MIL/uL — ABNORMAL LOW (ref 3.87–5.11)
RDW: 13.6 % (ref 11.5–15.5)
WBC: 4.8 10*3/uL (ref 4.0–10.5)

## 2017-04-26 LAB — URINALYSIS, ROUTINE W REFLEX MICROSCOPIC
Bilirubin Urine: NEGATIVE
Glucose, UA: NEGATIVE mg/dL
Ketones, ur: NEGATIVE mg/dL
Nitrite: POSITIVE — AB
PROTEIN: NEGATIVE mg/dL
SPECIFIC GRAVITY, URINE: 1.03 (ref 1.005–1.030)
pH: 5 (ref 5.0–8.0)

## 2017-04-26 LAB — SALICYLATE LEVEL: Salicylate Lvl: <7 mg/dL (ref 2.8–30.0)

## 2017-04-26 LAB — LIPASE, BLOOD: Lipase: 21 U/L (ref 11–51)

## 2017-04-26 LAB — RAPID URINE DRUG SCREEN, HOSP PERFORMED
AMPHETAMINES: NOT DETECTED
BENZODIAZEPINES: POSITIVE — AB
Barbiturates: NOT DETECTED
COCAINE: NOT DETECTED
OPIATES: NOT DETECTED
TETRAHYDROCANNABINOL: NOT DETECTED

## 2017-04-26 LAB — ETHANOL: Alcohol, Ethyl (B): <5 mg/dL (ref <5)

## 2017-04-26 LAB — ACETAMINOPHEN LEVEL: Acetaminophen (Tylenol), Serum: <10 ug/mL — ABNORMAL LOW (ref 10–30)

## 2017-04-26 MED ORDER — LEVOTHYROXINE SODIUM 25 MCG PO TABS: 25.0000 ug | ORAL_TABLET | Freq: Every day | ORAL | Status: DC

## 2017-04-26 MED ORDER — VITAMIN C 500 MG PO TABS
500.0000 mg | ORAL_TABLET | Freq: Every day | ORAL | Status: DC
Start: 2017-04-26 — End: 2017-04-26

## 2017-04-26 MED ORDER — IOPAMIDOL (ISOVUE-300) INJECTION 61%
INTRAVENOUS | Status: AC
  Administered 2017-04-26: 100 mL
  Filled 2017-04-26: qty 100

## 2017-04-26 MED ORDER — CEPHALEXIN 500 MG PO CAPS: 500.0000 mg | ORAL_CAPSULE | Freq: Three times a day (TID) | ORAL | 0 refills | Status: AC

## 2017-04-26 MED ORDER — LORAZEPAM 1 MG PO TABS: 2.0000 mg | ORAL_TABLET | Freq: Every evening | ORAL | Status: DC | PRN

## 2017-04-26 MED ORDER — SIMVASTATIN 5 MG PO TABS: 5.0000 mg | ORAL_TABLET | Freq: Every day | ORAL | Status: DC

## 2017-04-26 MED ORDER — LOSARTAN POTASSIUM 50 MG PO TABS: 100.0000 mg | ORAL_TABLET | Freq: Every day | ORAL | Status: DC

## 2017-04-26 MED ORDER — PILOCARPINE HCL 2 % OP SOLN: 1.0000 [drp] | Freq: Two times a day (BID) | OPHTHALMIC | Status: DC

## 2017-04-26 MED ORDER — LATANOPROST 0.005 % OP SOLN: 1.0000 [drp] | Freq: Every day | OPHTHALMIC | Status: DC

## 2017-04-26 MED ORDER — LUTEIN 20 MG PO TABS: 20.0000 mg | ORAL_TABLET | Freq: Every day | ORAL | Status: DC

## 2017-04-26 MED ORDER — ADULT MULTIVITAMIN W/MINERALS CH: 1.0000 | ORAL_TABLET | Freq: Every day | ORAL | Status: DC

## 2017-04-26 MED ORDER — VITAMIN D-3 25 MCG (1000 UT) PO CAPS: 1.0000 | ORAL_CAPSULE | Freq: Every day | ORAL | Status: DC

## 2017-04-26 MED ORDER — LABETALOL HCL 100 MG PO TABS: 100.0000 mg | ORAL_TABLET | Freq: Every day | ORAL | Status: DC

## 2017-04-26 MED ORDER — BRINZOLAMIDE 1 % OP SUSP: 1.0000 [drp] | Freq: Two times a day (BID) | OPHTHALMIC | Status: DC

## 2017-04-26 MED ORDER — FLUTICASONE PROPIONATE 50 MCG/ACT NA SUSP: 1.0000 | Freq: Every day | NASAL | Status: DC | PRN

## 2017-04-26 MED ORDER — ASPIRIN EC 81 MG PO TBEC: 81.0000 mg | DELAYED_RELEASE_TABLET | Freq: Every day | ORAL | Status: DC

## 2017-04-26 MED ORDER — DORZOLAMIDE HCL-TIMOLOL MAL 2-0.5 % OP SOLN: 1.0000 [drp] | Freq: Two times a day (BID) | OPHTHALMIC | Status: DC

## 2017-04-26 NOTE — ED Notes (Signed)
Patient transported to CT 

## 2017-04-26 NOTE — ED Provider Notes (Signed)
Received care of pt from Dr. Thurnell Garbe.  TTS evaluated and feel she is stable for outpatient evaluation of depression.  Pt denies SI at this time, contracts for safety.  Will treat for UTI with keflex. Patient discharged in stable condition with understanding of reasons to return.    Gareth Morgan, MD 04/27/17 1420
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
## 2017-04-26 NOTE — ED Notes (Signed)
Patient cannot give urine sample at this time.  Urine cup at bedside and sitter aware.

## 2017-04-26 NOTE — ED Provider Notes (Signed)
[facility] DEPT Provider Note   CSN: [id number] Arrival date & time: 04/26/17  1140     History   Chief Complaint Chief Complaint  Patient presents with  . suicidal, depression    HPI Margaret Li is a 83 y.o. female.  HPI  Pt was seen at 1215. Per pt, c/o gradual onset and worsening of persistent depression for the past 3 to 4 months. Pt endorses SI without a plan. Pt was evaluated by her PMD today, then sent to the ED for further evaluation. Pt also c/o LLQ abd "pain" for "a while now," and "several months of on and off diarrhea."  Pt states her stools "were black until I stopped taking the iron pills by Dr. Benson Norway." Denies fevers, no rash, no N/V, no CP/SOB, no focal motor weakness, no HI, no hallucinations.    Past Medical History:  Diagnosis Date  . Black tarry stools    "per patient"  . Glaucoma   . Hypertension   . Hypothyroidism     Patient Active Problem List   Diagnosis Date Noted  . Vision loss, left eye 02/17/2017  . Visual hallucinations 02/17/2017  . Neuropathy 02/17/2017  . Acute blood loss anemia 06/14/2015  . Essential hypertension 06/14/2015  . Hypothyroidism 06/14/2015  . Glaucoma 06/14/2015  . Rectal bleeding 06/13/2015    Past Surgical History:  Procedure Laterality Date  . ABDOMINAL HYSTERECTOMY     80's  . CATARACT EXTRACTION, BILATERAL    . ESOPHAGOGASTRODUODENOSCOPY (EGD) WITH PROPOFOL N/A 08/04/2016   Procedure: ESOPHAGOGASTRODUODENOSCOPY (EGD) WITH PROPOFOL;  Surgeon: Carol Ada, MD;  Location: [facility] ENDOSCOPY;  Service: Endoscopy;  Laterality: N/A;  . HERNIA REPAIR Right    Inguinal hernia repair '12 -Dr. Rise Patience  . JOINT REPLACEMENT Right    -RTHA -4 '11 - Dr. [name]- [facility]    OB History    No data available       Home Medications    Prior to Admission medications   Medication Sig Start Date End Date Taking? Authorizing Provider  aspirin EC 81 MG tablet Take 81 mg by mouth daily.    [provider]  bimatoprost  (LUMIGAN) 0.01 % SOLN Place 1 drop into both eyes at bedtime.    [provider]  brinzolamide (AZOPT) 1 % ophthalmic suspension Place 1 drop into both eyes 2 (two) times daily.    [provider]  Cholecalciferol (VITAMIN D-3) 1000 units CAPS Take 1 capsule by mouth daily.    [provider]  dorzolamide-timolol (COSOPT) 22.3-6.8 MG/ML ophthalmic solution Place 1 drop into both eyes 2 (two) times daily.    [provider]  fluticasone (FLONASE) 50 MCG/ACT nasal spray Place 1 spray into both nostrils daily as needed for allergies or rhinitis.    [provider]  imiquimod (ALDARA) 5 % cream Apply 5 application topically as directed. 11/15/16   [provider]  labetalol (NORMODYNE) 100 MG tablet Take 100 mg by mouth 2 (two) times daily.    [provider]  LORazepam (ATIVAN) 2 MG tablet Take 2 mg by mouth at bedtime as needed for anxiety or sleep.  04/27/15   [provider]  losartan (COZAAR) 50 MG tablet Take 100 mg by mouth daily.  06/01/15   [provider]  Lutein 20 MG TABS Take 20 mg by mouth daily.    [provider]  Multiple Vitamin (MULTIVITAMIN WITH MINERALS) TABS tablet Take 1 tablet by mouth daily.    [provider]  pilocarpine (PILOCAR) 2 % ophthalmic solution Place 1 drop into both eyes 2 (two) times daily.    [provider]  potassium chloride SA (K-DUR,KLOR-CON) 20 MEQ tablet Take 1 tablet (20 mEq total) by mouth daily. Patient taking differently: Take 20 mEq by mouth daily. Not taking now 06/18/15 06/21/15  Oswald Hillock, MD  simvastatin (ZOCOR) 10 MG tablet Take 5 mg by mouth daily at 6 PM.  05/21/15   [provider]  SYNTHROID 25 MCG tablet Take 25 mcg by mouth daily. 05/12/15   [provider]  vitamin C (ASCORBIC ACID) 500 MG tablet Take 500 mg by mouth daily.    [provider]    Family History No family history on file.  Social  History Social History  Substance Use Topics  . Smoking status: Former Smoker    Quit date: 08/01/2011  . Smokeless tobacco: Never Used  . Alcohol use No     Allergies   Sulfa antibiotics   Review of Systems Review of Systems ROS: Statement: All systems negative except as marked or noted in the HPI; Constitutional: Negative for fever and chills. ; ; Eyes: Negative for eye pain, redness and discharge. ; ; ENMT: Negative for ear pain, hoarseness, nasal congestion, sinus pressure and sore throat. ; ; Cardiovascular: Negative for chest pain, palpitations, diaphoresis, dyspnea and peripheral edema. ; ; Respiratory: Negative for cough, wheezing and stridor. ; ; Gastrointestinal: +abd pain, diarrhea. Negative for nausea, vomiting, blood in stool, hematemesis, jaundice and rectal bleeding. . ; ; Genitourinary: Negative for dysuria, flank pain and hematuria. ; ; Musculoskeletal: Negative for back pain and neck pain. Negative for swelling and trauma.; ; Skin: Negative for pruritus, rash, abrasions, blisters, bruising and skin lesion.; ; Neuro: Negative for headache, lightheadedness and neck stiffness. Negative for weakness, altered level of consciousness, altered mental status, extremity weakness, paresthesias, involuntary movement, seizure and syncope.; Psych: +depression, +SI, no SA, no HI, no hallucinations.        Physical Exam Updated Vital Signs BP 131/87 (BP Location: Left Arm)   Pulse 62   Temp 98.2 F (36.8 C) (Oral)   Resp 18   SpO2 100%   Physical Exam 1220: Physical examination:  Nursing notes reviewed; Vital signs and O2 SAT reviewed;  Constitutional: Well developed, Well nourished, Well hydrated, In no acute distress; Head:  Normocephalic, atraumatic; Eyes: EOMI, PERRL, No scleral icterus; ENMT: Mouth and pharynx normal, Mucous membranes moist; Neck: Supple, Full range of motion, No lymphadenopathy; Cardiovascular: Regular rate and rhythm, No gallop; Respiratory: Breath sounds  clear & equal bilaterally, No wheezes.  Speaking full sentences with ease, Normal respiratory effort/excursion; Chest: Nontender, Movement normal; Abdomen: Soft, +mild LLQ tenderness to palp. No rebound or guarding. Nondistended, Normal bowel sounds; Genitourinary: No CVA tenderness; Extremities: Pulses normal, No tenderness, No edema, No calf edema or asymmetry.; Neuro: AA&Ox3, Major CN grossly intact.  Speech clear. No gross focal motor or sensory deficits in extremities.; Skin: Color normal, Warm, Dry.; Psych:  Affect flat, poor eye contact.    ED Treatments / Results  Labs (all labs ordered are listed, but only abnormal results are displayed)   EKG  EKG Interpretation None       Radiology   Procedures Procedures (including critical care time)  Medications Ordered in ED Medications - No data to display   Initial Impression / Assessment and Plan / ED Course  I have reviewed the triage vital signs and the nursing notes.  Pertinent labs & imaging  results that were available during my care of the patient were reviewed by me and considered in my medical decision making (see chart for details).  MDM Reviewed: previous chart, nursing note and vitals Reviewed previous: labs Interpretation: labs and CT scan   Results for orders placed or performed during the hospital encounter of 04/26/17  Comprehensive metabolic panel  Result Value Ref Range   Sodium 140 135 - 145 mmol/L   Potassium 4.1 3.5 - 5.1 mmol/L   Chloride 106 101 - 111 mmol/L   CO2 29 22 - 32 mmol/L   Glucose, Bld 96 65 - 99 mg/dL   BUN 24 (H) 6 - 20 mg/dL   Creatinine, Ser 0.83 0.44 - 1.00 mg/dL   Calcium 9.0 8.9 - 10.3 mg/dL   Total Protein 6.2 (L) 6.5 - 8.1 g/dL   Albumin 3.8 3.5 - 5.0 g/dL   AST 23 15 - 41 U/L   ALT 18 14 - 54 U/L   Alkaline Phosphatase 45 38 - 126 U/L   Total Bilirubin 0.8 0.3 - 1.2 mg/dL   GFR calc non Af Amer >60 >60 mL/min   GFR calc Af Amer >60 >60 mL/min   Anion gap 5 5 - 15   Ethanol  Result Value Ref Range   Alcohol, Ethyl (B) <5 <5 mg/dL  Salicylate level  Result Value Ref Range   Salicylate Lvl <9.5 2.8 - 30.0 mg/dL  Acetaminophen level  Result Value Ref Range   Acetaminophen (Tylenol), Serum <10 (L) 10 - 30 ug/mL  Lipase, blood  Result Value Ref Range   Lipase 21 11 - 51 U/L  CBC with Differential  Result Value Ref Range   WBC 4.8 4.0 - 10.5 K/uL   RBC 3.40 (L) 3.87 - 5.11 MIL/uL   Hemoglobin 10.7 (L) 12.0 - 15.0 g/dL   HCT 32.4 (L) 36.0 - 46.0 %   MCV 95.3 78.0 - 100.0 fL   MCH 31.5 26.0 - 34.0 pg   MCHC 33.0 30.0 - 36.0 g/dL   RDW 13.6 11.5 - 15.5 %   Platelets 191 150 - 400 K/uL   Neutrophils Relative % 69 %   Neutro Abs 3.3 1.7 - 7.7 K/uL   Lymphocytes Relative 14 %   Lymphs Abs 0.7 0.7 - 4.0 K/uL   Monocytes Relative 15 %   Monocytes Absolute 0.7 0.1 - 1.0 K/uL   Eosinophils Relative 2 %   Eosinophils Absolute 0.1 0.0 - 0.7 K/uL   Basophils Relative 0 %   Basophils Absolute 0.0 0.0 - 0.1 K/uL   Ct Abdomen Pelvis W Contrast Result Date: 04/26/2017 CLINICAL DATA:  83 year old female with abdominal pain and nausea for several weeks. EXAM: CT ABDOMEN AND PELVIS WITH CONTRAST TECHNIQUE: Multidetector CT imaging of the abdomen and pelvis was performed using the standard protocol following bolus administration of intravenous contrast. CONTRAST:  128mL ISOVUE-300 IOPAMIDOL (ISOVUE-300) INJECTION 61% COMPARISON:  02/19/2016 and prior CTs FINDINGS: Lower chest: No acute abnormality.  Cardiomegaly again identified. Hepatobiliary: Mild intrahepatic, CBD and pancreatic ductal dilatation is not significantly changed from 2005. The liver is unremarkable. Cholelithiasis identified without CT evidence of acute cholecystitis. Pancreas: Scattered calcifications again noted. Spleen: Unremarkable Adrenals/Urinary Tract: Bilateral renal cortical atrophy and cysts again noted. The adrenal glands and bladder are unremarkable. Stomach/Bowel: Extensive diverticulosis  of the colon noted. There is no evidence of bowel obstruction, definite bowel wall thickening or inflammatory changes. Vascular/Lymphatic: Aortic atherosclerosis. No enlarged abdominal or pelvic lymph nodes. Reproductive: Status post hysterectomy.  No adnexal masses. Other: No ascites, pneumoperitoneum or focal collection. Musculoskeletal: No acute or suspicious abnormality. Right total hip arthroplasty and degenerative changes within the lumbar spine again noted. Compression of the L1 inferior endplate is unchanged. IMPRESSION: No evidence of acute abnormality. Extensive colonic diverticulosis without definite evidence of diverticulitis. Aortic Atherosclerosis (ICD10-I70.0). Electronically Signed   By: Margarette Canada M.D.   On: 04/26/2017 14:49    1540:  H/H per baseline. Workup reassuring. Will have TTS evaluate.     Final Clinical Impressions(s) / ED Diagnoses   Final diagnoses:  None    New Prescriptions New Prescriptions   No medications on file     Francine Graven, DO 04/26/17 1544

## 2017-04-26 NOTE — Patient Outreach (Signed)
[facility]) Care Management  04/26/2017  Margaret Li 09-09-1929 [id number]  Assessment- CSW received incoming call from [name] and was notified that patient went to ED for suicidal thoughts. Patient went to doctors appointment and informed him that she has been so depressed and that she doesn't want to live. CSW was also informed that [name] spoke to patient's PCP and discussed case and concerns. CSW was unable to schedule a home visit during initial outreach with patient as patient was not willing to do so, felt overwhelmed with the amount of people calling her and refused services. However, patient later was agreeable to CSW contacting patient in a week to schedule home visit and assist with community resources. CSW contacted PCP office and left a message with Dr. Inda Merlin nurse informing her that she will be following patient and awaiting for hospital discharge. CSW also included on voice message that CSW can assist with SCAT application if needed.  Plan-CSW will continue to follow patient, provide social work support and await for hospital discharge.  Eula Fried, BSW, MSW, [email or web address] Phone: [phone] Fax: [phone]

## 2017-04-26 NOTE — Patient Outreach (Signed)
[facility]) Care Management  04/26/2017  Margaret Li 07-Aug-1929 [id number]   Patient was called regarding medication management (side effects from her medications) per referral from [place].  Unfortunately, patient did not answer.  HIPAA compliant message was left on her voicemail.   Plan:  Call patient back in 5-7 business days  Elayne Guerin, PharmD, [place] Clinical Pharmacist [phone]

## 2017-04-26 NOTE — ED Triage Notes (Signed)
Patient arrived voluntary c/o depression with suicidal thoughts without a plan.  Patient went to her doctors this morning and told him she has been so depressed she doesn't want to live.  Denies homicidal ideation or AVH.  Patient is calm and cooperative.

## 2017-04-26 NOTE — ED Notes (Signed)
Patient's friend who brought her in and will pick her up at discharge.

## 2017-04-27 ENCOUNTER — Other Ambulatory Visit: Payer: Self-pay

## 2017-04-27 ENCOUNTER — Other Ambulatory Visit: Payer: Self-pay | Admitting: Licensed Clinical Social Worker

## 2017-04-27 NOTE — Patient Outreach (Signed)
[facility]) Care Management  04/27/2017  SHAVELLE RUNKEL 11-27-1928 [id number]  Assessment-CSW completed outreach attempt today for hospital follow up (for suicidal thoughts without plan.) CSW unable to reach patient successfully on both contact numbers. CSW left a HIPPA compliant voice message encouraging patient to return call once available.  Plan-CSW will await return call or complete an additional outreach by 04/30/17.  Eula Fried, BSW, MSW, [email or web address] Phone: [phone] Fax: [phone]

## 2017-04-27 NOTE — Patient Outreach (Signed)
[facility]) Care Management  04/27/2017  KARISA NESSER May 24, 1929 [id number]  Subjective: none  Objective: none  Assessment:  83 year old with history of stroke, HTN, glaucoma, hypothyroid.  RNCM received referral from telephonic care coordinator.  RNCM called to follow up post emergency room visit. No answer. HIPPA compliant message left.  Plan: [place] work following. RNCM will await return call. Continue to follow as previously planned.  Thea Silversmith, RN, MSN, [place] Coordinator Cell: [phone]

## 2017-04-30 ENCOUNTER — Other Ambulatory Visit: Payer: Self-pay | Admitting: Licensed Clinical Social Worker

## 2017-04-30 ENCOUNTER — Ambulatory Visit: Payer: Medicare Other | Admitting: Pharmacist

## 2017-04-30 ENCOUNTER — Other Ambulatory Visit: Payer: Self-pay | Admitting: Pharmacist
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
## 2017-04-30 NOTE — Patient Outreach (Signed)
[facility]) Care Management  04/30/2017  CHEVETTE FEE [birth year] [id number]  Assessment- CSW completed outreach call to patient on 04/30/17 and patient answered successfully and provided HIPPA verifications. Patient had a recent ED visit on 6/21 for suicidal thoughts without plan. Patient reports "I'm doing better than I was last week. I'm still down but I'm hanging in there." Patient denies being on any antidepressants but has taken Lorazepam but experienced side effects (hallucinations.) CSW questioned if it would be okay if CSW followed through with hospital recommendations and made appointment for her with Dr. Marchelle Gearing with [facility]. Patient declined this offer stating "I'm tired of going to so many doctor appointments and going to the hospital. I don't want to go to any new appointments until after I come home from [place]. I'm doing this for me." Patient reports that she will be visiting family in [place] next month and does not wish to schedule any new appointments at this time. CSW expressed concerns due to her experiencing recent suicidal thoughts. Patient reports "I just don't want to deal with that right now." Patient reports that she went for a walk this morning and that it was helped to lift her mood. CSW educated patient on appropriate coping methods that she can use. CSW was able to successfully schedule home visit for tomorrow on 05/01/17 but patient was not agreeable at first. CSW thanked patient for her time today.  Plan-CSW will complete home visit tomorrow and provide mental health resources and coping tool education.  Eula Fried, BSW, MSW, [email or web address] Phone: [phone] Fax: [phone]

## 2017-05-01 ENCOUNTER — Ambulatory Visit: Payer: Self-pay | Admitting: Pharmacist

## 2017-05-01 ENCOUNTER — Other Ambulatory Visit: Payer: Self-pay | Admitting: Licensed Clinical Social Worker

## 2017-05-01 NOTE — Patient Outreach (Signed)
[facility]) Care Management  [facility] Social Work  05/01/2017  Margaret Li December 14, 1928 [id number]  Encounter Medications:  Outpatient Encounter Prescriptions as of 05/01/2017  Medication Sig Note  . aspirin EC 81 MG tablet Take 81 mg by mouth daily.   . bimatoprost (LUMIGAN) 0.01 % SOLN Place 1 drop into both eyes at bedtime.   . brinzolamide (AZOPT) 1 % ophthalmic suspension Place 1 drop into both eyes 2 (two) times daily.   . cephALEXin (KEFLEX) 500 MG capsule Take 1 capsule (500 mg total) by mouth 3 (three) times daily.   . Cholecalciferol (VITAMIN D-3) 1000 units CAPS Take 1 capsule by mouth daily.   . dorzolamide-timolol (COSOPT) 22.3-6.8 MG/ML ophthalmic solution Place 1 drop into both eyes 2 (two) times daily.   . fluticasone (FLONASE) 50 MCG/ACT nasal spray Place 1 spray into both nostrils daily as needed for allergies or rhinitis.   Marland Kitchen labetalol (NORMODYNE) 100 MG tablet Take 100 mg by mouth daily.    Marland Kitchen LORazepam (ATIVAN) 2 MG tablet Take 2 mg by mouth at bedtime as needed for anxiety or sleep.    Marland Kitchen losartan (COZAAR) 50 MG tablet Take 100 mg by mouth daily.    . Lutein 20 MG TABS Take 20 mg by mouth daily.   . Multiple Vitamin (MULTIVITAMIN WITH MINERALS) TABS tablet Take 1 tablet by mouth daily. 04/25/2017: Takes "occasionally"  . pilocarpine (PILOCAR) 2 % ophthalmic solution Place 1 drop into both eyes 2 (two) times daily.   . potassium chloride SA (K-DUR,KLOR-CON) 20 MEQ tablet Take 1 tablet (20 mEq total) by mouth daily. (Patient taking differently: Take 20 mEq by mouth daily. Not taking now)   . simvastatin (ZOCOR) 10 MG tablet Take 5 mg by mouth daily at 6 PM.    . SYNTHROID 25 MCG tablet Take 25 mcg by mouth daily.   . vitamin C (ASCORBIC ACID) 500 MG tablet Take 500 mg by mouth daily.    No facility-administered encounter medications on file as of 05/01/2017.     Functional Status:  In your present state of health, do you have any difficulty performing the  following activities: 04/26/2017 04/25/2017  Hearing? N N  Vision? N N  Difficulty concentrating or making decisions? - Y  Walking or climbing stairs? Y Y  Dressing or bathing? N N  Doing errands, shopping? - Scientist, forensic and eating ? - Y  Using the Toilet? - N  In the past six months, have you accidently leaked urine? - Y  Do you have problems with loss of bowel control? - Y  Managing your Medications? - Y  Managing your Finances? - N  Housekeeping or managing your Housekeeping? - Y  Some recent data might be hidden    Fall/Depression Screening:  PHQ 2/9 Scores 05/01/2017 04/25/2017 04/18/2017  PHQ - 2 Score 6 6 0  PHQ- 9 Score 16 16 -    Assessment: CSW completed initial home visit with patient on 05/01/17. Patient shares that she had "a rough night" because she had been experiencing diarrhea. Patient reports that she has been feeling "tired and sluggish." Patient admits that she continues to struggle with depressive symptoms but has not experienced any further suicidal ideations since recent hospitalization. Patient reports that she lost a paper that she received at the hospital with their mental health recommendation and CSW wrote Dr. Karen Chafe contact information down on a new sheet for patient to keep. Patient reports that she went to a  psychiatrist before but cannot remember the name of the agency or doctor and cannot give an estimate as to how long ago it was but states "it was many many years ago." She reports that she also has been to a counselor before but did not find it to be helpful to her and stopped going. She is unable to state the counselor's name as well. Patient reports that she was experiencing some hallucinations while taking trazodone and has discontinued medication. She denies experiencing any recent hallucinations since discontinuing medication. CSW provided depression management worksheet which reviewed appropriate coping methods. CSW also provided patient with 3 pages  of mental health resources within [facility]. Patient is adamant that she does not wish to follow through with hospital's recommendations and attend an appointment with Dr. Casimiro Needle at [place] until after she has returned from [place] next month (to visit family.) Patient shares that she does not wish to go to "any new appointments" until after she has visited her family. She states that she has been recently overwhelmed with the amount of calls she receives, people coming to her home and doctor appointments. Patient attempted to go to [facility] recently to gain mental health services but could not find location. CSW questioned if she would be willing to go there instead of Dr. Casimiro Needle and she declined. CSW reviewed [facility] in [place] with patient which has different support groups and activities (meditation, women's wellness groups, creative writing, building self-esteem, art classes, road to recovery) that are absolutely free. Patient is very interested in these groups but does not have stable transportation to get to them. CSW completed call to [facility] in [place] and received additional information per patient's request. Patient is interested in the [facility] Wellness Group which is held every Wednesday. Patient is agreeable to CSW mailing out both the wellness and support group calendars for the [facility] in [place]. CSW also provided patient with a list of socialization opportunities as she has admitted to recent isolation. Patient is very interested in the [facility] which is 5 miles away from her residence. CSW will also mail this calendar to patient's residence. Patient reports that she has lost some weight recently but is attempting to eat 3 meals per day. She reports that she has recently started [employer] as well.   Patient is interested in gaining stable transportation through [place]  and has provided application to PCP to sign and fax to [place] office. Patient reports that she wishes CSW to check with PCP to see if application was successfully completed and faxed. CSW completed call to PCP and left message with her nurse. CSW provided mental health updates as well as informed PCP that patient was interested in gaining an update in relation to the SCAT application.  [facility] CM Care Plan Problem One     Most Recent Value  Care Plan Problem One  Lack of overall community resources and services (both mental health and transportation)  Role Documenting the Problem One  Clinical Social Worker  Care Plan for Problem One  Active  THN Long Term Goal   Patient will gain appropriate mental health services within 90 days  THN Long Term Goal Start Date  05/01/17  Interventions for Problem One Long Term Goal  CSW completed home visit and provided list of mental health resources and reviewed each one which includes: women support groups, psychiatrist and counseling services. Patient prefers to go to a new  appointment after her trip to [place] but is at least agreeable to gaining mental health support groups and socialization through the [facility]. CSW will continue to provide emotional support and counseling as needed.  THN CM Short Term Goal #1   Patient will schedule intake appointment with SCAT within 30 days  THN CM Short Term Goal #1 Start Date  05/01/17  Interventions for Short Term Goal #1  CSW has provided transportation resources and reviewed the SCAT application process. Patient has completed SCAT application and provided it to PCP. CSW completed call to PCP office and left message with nurse wanting to ensure that SCAT application was faxed.     Plan: CSW will route encounter to PCP. CSW will follow up within two weeks and mail out [facility] calendar as well as [place] support group calendars.  Eula Fried, BSW, MSW, [email or web address] Phone: [phone] Fax: [phone]

## 2017-05-03 ENCOUNTER — Other Ambulatory Visit: Payer: Self-pay | Admitting: Licensed Clinical Social Worker

## 2017-05-03 NOTE — Patient Outreach (Signed)
[place] [facility] Care Management  05/03/2017  Margaret Li [birth year] [id number]  Assessment- CSW received return voice message from PCP's nurse yesterday on 05/02/17. CSW was informed that SCAT application was successfully completed and faxed and PCP asked that I follow up with services to ensure that patient will gain stable transportation. Nurse also stated that she would provide mental health update to PCP.   CSW received incoming call from PCP's nurse on 05/03/17. CSW and nurse discussed case and concerns. CSW was made aware that patient's daughter and grandson who live in [place] have been trying to convince patient to relocate back to [place]. Patient did not mention any desire to relocate to CSW during home visit this week but will monitor this.   Plan-CSW will contact SCAT within one week to follow up on application status.  Eula Fried, BSW, MSW, [email or web address] Phone: [phone] Fax: [phone]

## 2017-05-08 ENCOUNTER — Ambulatory Visit: Payer: Medicare Other | Admitting: Neurology

## 2017-05-10 ENCOUNTER — Other Ambulatory Visit: Payer: Self-pay | Admitting: Licensed Clinical Social Worker

## 2017-05-10 ENCOUNTER — Other Ambulatory Visit: Payer: Self-pay

## 2017-05-10 NOTE — Patient Outreach (Signed)
[place] [facility]) Care Management  05/10/2017  Margaret Li 06-29-1929 [id number]  Subjective: none  Objective: none  Assessment: 83 year old with history of stroke, HTN, glaucoma, hypothyroid. RNCM returned call to client.No answer. HIPPA compliant message left.  Plan: await return call and follow up within the next 1-2 weeks.  Thea Silversmith, RN, MSN, [place] Coordinator Cell: [phone]

## 2017-05-10 NOTE — Patient Outreach (Signed)
[place] [facility]) Care Management  05/10/2017  HADASSAH RANA 07-Dec-1928 [id number]  Assessment- CSW received incoming call from patient's daughter [name] that lives in [place], [place]. Consent signed. HIPPA verifications were received. Daughter reports that patient gave her CSW's number and stated that patient informed daughter that enjoyed talking with CSW. Daughter shares that she contacted several Psychiatric nurse and that [employer] was one that patient was interested in and she is hopeful that patient will be willing to go there and gain socialization. CSW informed her that CSW has provided patient with [facility] information. CSW also informed daughter that patient was not agreeable to going to any new appointments (mental health) but was agreeable to consider going to the [place] support groups once she gains stable transportation. CSW spent time reviewing other resources with daughter that have already been provided to patient. Daughter states "I won't mention the [facility] or the mental health support groups to my mom because she won't want to go if I try and encourage her. I will just let you talk to her about that and hopefully she will want to go." Daughter reports that patient is coming to visit her in [place] on 05/25/17 until 05/28/17 and that they will be looking at retirement communities nearby so that eventually patient can relocate there in order to be near family.   CSW will contact SCAT tomorrow to inquire about current application.  Plan-CSW will continue to provide social work support and assistance.  Eula Fried, BSW, MSW, [email or web address] Phone: [phone] Fax: [phone]

## 2017-05-10 NOTE — Patient Outreach (Signed)
[facility]) Care Management  05/10/2017  Margaret Li 02-Apr-1929 [id number]  Subjective: none  Objective: none  Assessment: 83 year old with history of stroke, HTN, glaucoma, hypothyroid. RNCM called to follow up. No answer. HIPPA compliant message left.  Plan: await return call and follow up within the next 1-2 weeks.  Thea Silversmith, RN, MSN, [place] Coordinator Cell: [phone]

## 2017-05-11 ENCOUNTER — Other Ambulatory Visit: Payer: Self-pay | Admitting: Licensed Clinical Social Worker

## 2017-05-11 NOTE — Patient Outreach (Signed)
[place] [facility]) Care Management  05/11/2017  Margaret Li 08/23/1929 [id number]  Assessment- CSW completed call to [facility] with SCAT services in order to inquire about status of SCAT application. CSW unable to reach [facility] successfully but left a HIPPA compliant voice message encouraging return call with updates.  Plan-CSW will await for return call from SCAT or complete additional attempt within one week.  Eula Fried, BSW, MSW, [email or web address] Phone: [phone] Fax: [phone]

## 2017-05-15 ENCOUNTER — Other Ambulatory Visit: Payer: Self-pay | Admitting: Licensed Clinical Social Worker

## 2017-05-15 NOTE — Patient Outreach (Addendum)
[place] [facility]) Care Management  05/15/2017  ZENOVIA JUSTMAN 05/06/1929 [id number]  Assessment-CSW completed outreach attempt today to follow up on SCAT. CSW unable to reach patient successfully. CSW left a HIPPA compliant voice message encouraging patient to return call once available.  CSW received return call from patient later in the day. She reports that she is doing "very good" and "feels okay mentally." CSW provided biref review of depression management coping methods. Patient reports that she has not heard from SCAT yet. CSW informed her that she will attempt to reach SCAT again to inquire about her application status. Patient appreciative of this.  CSW completed additional attempt to SCAT and left a message with [facility] requesting a return call with updates.  Plan-CSW will await return call from SCAT or complete additional outreach attempt within two weeks.   Eula Fried, BSW, MSW, [email or web address] Phone: [phone] Fax: [phone]

## 2017-05-17 ENCOUNTER — Other Ambulatory Visit: Payer: Self-pay

## 2017-05-17 NOTE — Patient Outreach (Signed)
[place] [facility]) Care Management  05/17/2017  Margaret Li [birth year] [id number]  Subjective: client reports she has a good appetite, but is not gaining weight.  Objective: none-telephonic call  Assessment: 83 year old with history of stroke, HTN, glaucoma, hypothyroid.   RNCM called to follow up. Margaret Li reports she is getting ready to go out of town to spend some time with her family. She states she does not know how long she will be gone.  Margaret Li states she is eating at least three meals a day, but states she does not like the meals on wheels. RNCM encourage client to continue to eat small frequent meals. High protein packed foods discussed.  RNCM encouraged client to call RNCM if she returns from out of town sooner.  Plan: RNCM will follow up telephonically next month.  Margaret Silversmith, RN, MSN, [place] Coordinator Cell: [phone]

## 2017-05-18 ENCOUNTER — Other Ambulatory Visit: Payer: Self-pay | Admitting: Licensed Clinical Social Worker

## 2017-05-18 NOTE — Patient Outreach (Signed)
[place] [facility]) Care Management  05/18/2017  Margaret Li 11-03-1929 [id number]  Assessment- CSW completed additional call to SCAT and was able to reach [facility]. Margaret Li confirmed that application was received and that she will be contacting patient today to schedule assessment appointment.  Plan-CSW will follow up within two weeks.  Eula Fried, BSW, MSW, [email or web address] Phone: [phone] Fax: [phone]

## 2017-05-25 ENCOUNTER — Other Ambulatory Visit: Payer: Self-pay | Admitting: Licensed Clinical Social Worker

## 2017-05-25 NOTE — Patient Outreach (Signed)
[place] [facility]) Care Management  05/25/2017  Margaret Li August 31, 1929 [id number]  Assessment-CSW completed outreach attempt today. CSW unable to reach patient successfully. CSW left a HIPPA compliant voice message encouraging patient to return call once available.  Plan-CSW will await return call or complete an additional outreach if needed.  Margaret Li, BSW, MSW, [email or web address] Phone: [phone] Fax: [phone]

## 2017-06-04 ENCOUNTER — Other Ambulatory Visit: Payer: Self-pay | Admitting: Licensed Clinical Social Worker

## 2017-06-04 NOTE — Patient Outreach (Signed)
[place] [facility]) Care Management  06/04/2017  Margaret Li 07-Dec-1928 [id number]  Assessment- CSW completed outreach to patient but was unable to reach her. CSW is unsure if patient is still visiting with family. CSW left a HIPPA compliant voice message encouraging a return call.  CSW completed all to [place] with SCAT services. They were unable to be reached but a HIPPA compliant was left with both staff members inquiring about patient's SCAT application.  Plan-CSW will await for return call from both SCAT and patient.  Eula Fried, BSW, MSW, [email or web address] Phone: [phone] Fax: [phone]

## 2017-06-07 ENCOUNTER — Other Ambulatory Visit: Payer: Self-pay | Admitting: Licensed Clinical Social Worker

## 2017-06-07 NOTE — Patient Outreach (Signed)
[place] [facility]) Care Management  06/07/2017  Margaret Li [birth year] [id number]  Assessment- CSW completed outreach call to patient and she answered. She reports having a wonderful time visiting her family. She shares that they discussed her eventually moving there to be around family but that they have not decided anything as of now. CSW questioned if she had heard from SCAT and she declined. CSW informed her that she has contacted SCAT several times to inquire about her application status but has not heard back. CSW encouraged patient to also contact SCAT as well. Contact number provided. Patient reports that her mood "is good" after getting to socialize and spend time with her family. CSW provided brief education on appropriate depression management coping skills.  Plan-CSW will await to hear back from SCAT. CSW will follow up within two weeks.  Margaret Li, BSW, MSW, [email or web address] Phone: [phone] Fax: [phone]

## 2017-06-11 ENCOUNTER — Other Ambulatory Visit: Payer: Self-pay | Admitting: Licensed Clinical Social Worker

## 2017-06-11 NOTE — Patient Outreach (Signed)
[place] [facility]) Care Management  06/11/2017  DAMYAH GUGEL 07-18-1929 [id number]  Assessment- CSW has not heard back from SCAT. CSW completed additional outreach to both [place] with SCAT and left another voice message requesting a return call with updates in regards to patient's SCAT application status.  Plan-CSW will await to hear back from SCAT and will continue to work on assisting patient with gaining stable transportation.   Eula Fried, BSW, MSW, [email or web address] Phone: [phone] Fax: [phone]

## 2017-06-14 ENCOUNTER — Other Ambulatory Visit: Payer: Self-pay | Admitting: Licensed Clinical Social Worker

## 2017-06-14 NOTE — Patient Outreach (Signed)
[place] [facility]) Care Management  06/14/2017  Margaret Li [birth year] [id number]  Assessment- CSW sent secure email message to [facility] with SCAT and received a reply. CSW was informed that they made one attempt to reach patient on 05/28/17 but was not able to reach her and that they left a voice message requesting a return call but had not heard back yet.   CSW completed call to patient and she answered. CSW questioned if she could complete a home visit. Patient declined stating "I rather do the visit telephonically." CSW questioned if it would be better to contact her in a few weeks to schedule  home visit and patient stated "I'm not sure that will be necessary. We will just see how things go." Patient was provided SCAT update. Patient reports that she just now got off the phone with SCAT and her assessment appointment is on 06/18/17 and that they will pick her up between 8:00-8:45. CSW completed review of available socialization opportunities and mental health resources. Patient shares that she has not had the energy to pursue these yet.   Plan-CSW will follow up within two weeks and continue to provide social work support.  Eula Fried, BSW, MSW, [email or web address] Phone: [phone] Fax: [phone]

## 2017-06-20 DIAGNOSIS — H40053 Ocular hypertension, bilateral: Secondary | ICD-10-CM | POA: Diagnosis not present

## 2017-06-20 DIAGNOSIS — H401132 Primary open-angle glaucoma, bilateral, moderate stage: Secondary | ICD-10-CM | POA: Diagnosis not present

## 2017-06-20 DIAGNOSIS — H401431 Capsular glaucoma with pseudoexfoliation of lens, bilateral, mild stage: Secondary | ICD-10-CM | POA: Diagnosis not present

## 2017-06-21 ENCOUNTER — Other Ambulatory Visit: Payer: Self-pay

## 2017-06-21 NOTE — Patient Outreach (Signed)
[place] [facility]) Care Management  06/21/2017  [name] 1929/07/06 [id number]   Subjective: "I'm doing pretty well now".  Objective: none-telephonic call  Assessment: 83 year old with history of stroke, HTN, glaucoma, hypothyroid. Referred to CM from EMMI prevent screening.  RNCM called to follow up. Client reports appetite is good. She states she is cooking and preparing meals for herself without difficulty. She continues to follow up with providers as needed. Client denies any nursing CM needs at this time.   RNCM encouraged client to call RNCM as needed. RNCM reinforced 24 hour nurse advice line as needed.  Plan: RNCM will close out of case. Margaret Fried, LCSW continues to provide social work support.   Thea Silversmith, RN, MSN, [place] Coordinator Cell: [phone]

## 2017-06-22 ENCOUNTER — Other Ambulatory Visit: Payer: Self-pay | Admitting: Licensed Clinical Social Worker

## 2017-06-22 NOTE — Patient Outreach (Signed)
[facility]) Care Management  06/22/2017  Margaret Li January 20, 1929 [id number]  Assessment- CSW completed outreach call to patient to follow up on SCAT assessment on 06/18/17. CSW unable to reach patient successfully but HIPPA compliant message was left requesting a return call. [facility] RNCM informed CSW that she will close out of patient's case.  Plan-CSW will await for return call or complete additional outreach within 2 weeks.  Eula Fried, BSW, MSW, [email or web address] Phone: [phone] Fax: [phone]

## 2017-06-25 ENCOUNTER — Other Ambulatory Visit: Payer: Self-pay | Admitting: Licensed Clinical Social Worker

## 2017-06-25 DIAGNOSIS — F332 Major depressive disorder, recurrent severe without psychotic features: Secondary | ICD-10-CM | POA: Diagnosis not present

## 2017-06-25 NOTE — Patient Outreach (Signed)
[place] [facility]) Care Management  06/25/2017  BERKLEE BATTEY 01/20/1929 [id number]  Assessment-CSW completed outreach attempt today. CSW unable to reach patient successfully. CSW left a HIPPA compliant voice message encouraging patient to return call once available with updates in regards to her recent SCAT evaluation appointment.  Plan-CSW will await return call or complete an additional outreach if needed within two weeks.  Eula Fried, BSW, MSW, [email or web address] Phone: [phone] Fax: [phone]

## 2017-06-26 ENCOUNTER — Other Ambulatory Visit: Payer: Self-pay | Admitting: Licensed Clinical Social Worker

## 2017-06-26 NOTE — Patient Outreach (Signed)
[place] [facility]) Care Management  06/26/2017  DETTA MELLIN October 12, 1929 [id number]  Assessment- CSW received incoming call from patient. HIPPA verifications provided. Patient confirms that she not has stable transportation with SCAT services. She confirms that she already has her badge as well and can start using SCAT. Patient was provided education on how to schedule transportation arrangements. Goal met. CSW reviewed mental health resources again with patient. Patient does not wish to pursue any of these at this time stating "I have too much going on but I will hold on to these papers you gave me." Patient denies any further social work needs. She reports that she is possibly going to be relocating closer to her family. Patient is agreeable to case closure at this time and denies needing any other support.   Plan-CSW will inform [facility] Care Management Assistant and PCP of case closure.   Eula Fried, BSW, MSW, [email or web address] Phone: [phone] Fax: [phone]

## 2017-08-01 DIAGNOSIS — H401430 Capsular glaucoma with pseudoexfoliation of lens, bilateral, stage unspecified: Secondary | ICD-10-CM | POA: Diagnosis not present

## 2017-08-01 DIAGNOSIS — H401112 Primary open-angle glaucoma, right eye, moderate stage: Secondary | ICD-10-CM | POA: Diagnosis not present

## 2017-08-01 DIAGNOSIS — H401122 Primary open-angle glaucoma, left eye, moderate stage: Secondary | ICD-10-CM | POA: Diagnosis not present

## 2017-08-01 DIAGNOSIS — H40053 Ocular hypertension, bilateral: Secondary | ICD-10-CM | POA: Diagnosis not present

## 2017-08-02 DIAGNOSIS — H401112 Primary open-angle glaucoma, right eye, moderate stage: Secondary | ICD-10-CM | POA: Diagnosis not present

## 2017-08-02 DIAGNOSIS — H401123 Primary open-angle glaucoma, left eye, severe stage: Secondary | ICD-10-CM | POA: Diagnosis not present

## 2017-08-22 DIAGNOSIS — Z23 Encounter for immunization: Secondary | ICD-10-CM | POA: Diagnosis not present

## 2017-08-29 DIAGNOSIS — E538 Deficiency of other specified B group vitamins: Secondary | ICD-10-CM | POA: Diagnosis not present

## 2017-08-29 DIAGNOSIS — R634 Abnormal weight loss: Secondary | ICD-10-CM | POA: Diagnosis not present

## 2017-08-29 DIAGNOSIS — E7800 Pure hypercholesterolemia, unspecified: Secondary | ICD-10-CM | POA: Diagnosis not present

## 2017-08-29 DIAGNOSIS — Z136 Encounter for screening for cardiovascular disorders: Secondary | ICD-10-CM | POA: Diagnosis not present

## 2017-08-29 DIAGNOSIS — Z0000 Encounter for general adult medical examination without abnormal findings: Secondary | ICD-10-CM | POA: Diagnosis not present

## 2017-08-29 DIAGNOSIS — H9190 Unspecified hearing loss, unspecified ear: Secondary | ICD-10-CM | POA: Diagnosis not present

## 2017-08-29 DIAGNOSIS — E039 Hypothyroidism, unspecified: Secondary | ICD-10-CM | POA: Diagnosis not present

## 2017-08-29 DIAGNOSIS — F332 Major depressive disorder, recurrent severe without psychotic features: Secondary | ICD-10-CM | POA: Diagnosis not present

## 2017-08-29 DIAGNOSIS — F411 Generalized anxiety disorder: Secondary | ICD-10-CM | POA: Diagnosis not present

## 2017-08-29 DIAGNOSIS — I10 Essential (primary) hypertension: Secondary | ICD-10-CM | POA: Diagnosis not present

## 2017-08-29 DIAGNOSIS — M810 Age-related osteoporosis without current pathological fracture: Secondary | ICD-10-CM | POA: Diagnosis not present

## 2017-08-29 DIAGNOSIS — M150 Primary generalized (osteo)arthritis: Secondary | ICD-10-CM | POA: Diagnosis not present

## 2017-09-07 DIAGNOSIS — H401123 Primary open-angle glaucoma, left eye, severe stage: Secondary | ICD-10-CM | POA: Diagnosis not present

## 2017-09-07 DIAGNOSIS — H401112 Primary open-angle glaucoma, right eye, moderate stage: Secondary | ICD-10-CM | POA: Diagnosis not present

## 2017-10-04 DIAGNOSIS — R1010 Upper abdominal pain, unspecified: Secondary | ICD-10-CM | POA: Diagnosis not present

## 2017-10-04 DIAGNOSIS — N281 Cyst of kidney, acquired: Secondary | ICD-10-CM | POA: Diagnosis not present

## 2017-10-10 DIAGNOSIS — R110 Nausea: Secondary | ICD-10-CM | POA: Diagnosis not present

## 2017-10-10 DIAGNOSIS — M25551 Pain in right hip: Secondary | ICD-10-CM | POA: Diagnosis not present

## 2017-10-10 DIAGNOSIS — I10 Essential (primary) hypertension: Secondary | ICD-10-CM | POA: Diagnosis not present

## 2017-10-28 ENCOUNTER — Encounter (HOSPITAL_COMMUNITY): Payer: Self-pay | Admitting: Emergency Medicine

## 2017-10-28 ENCOUNTER — Emergency Department (HOSPITAL_COMMUNITY): Payer: Medicare Other

## 2017-10-28 ENCOUNTER — Emergency Department (HOSPITAL_COMMUNITY)
Admission: EM | Admit: 2017-10-28 | Discharge: 2017-10-29 | Disposition: A | Discharge status: Skilled Nursing Facility | Payer: Medicare Other | Attending: Emergency Medicine | Admitting: Emergency Medicine

## 2017-10-28 DIAGNOSIS — Z79899 Other long term (current) drug therapy: Secondary | ICD-10-CM | POA: Insufficient documentation

## 2017-10-28 DIAGNOSIS — R1030 Lower abdominal pain, unspecified: Secondary | ICD-10-CM | POA: Diagnosis not present

## 2017-10-28 DIAGNOSIS — R1032 Left lower quadrant pain: Secondary | ICD-10-CM | POA: Diagnosis not present

## 2017-10-28 DIAGNOSIS — R51 Headache: Secondary | ICD-10-CM | POA: Diagnosis not present

## 2017-10-28 DIAGNOSIS — Z7982 Long term (current) use of aspirin: Secondary | ICD-10-CM | POA: Diagnosis not present

## 2017-10-28 DIAGNOSIS — R509 Fever, unspecified: Secondary | ICD-10-CM | POA: Diagnosis not present

## 2017-10-28 DIAGNOSIS — R110 Nausea: Secondary | ICD-10-CM | POA: Insufficient documentation

## 2017-10-28 DIAGNOSIS — R197 Diarrhea, unspecified: Secondary | ICD-10-CM | POA: Diagnosis not present

## 2017-10-28 DIAGNOSIS — I10 Essential (primary) hypertension: Secondary | ICD-10-CM | POA: Insufficient documentation

## 2017-10-28 DIAGNOSIS — Z87891 Personal history of nicotine dependence: Secondary | ICD-10-CM | POA: Insufficient documentation

## 2017-10-28 DIAGNOSIS — I6789 Other cerebrovascular disease: Secondary | ICD-10-CM | POA: Diagnosis not present

## 2017-10-28 DIAGNOSIS — K5730 Diverticulosis of large intestine without perforation or abscess without bleeding: Secondary | ICD-10-CM | POA: Diagnosis not present

## 2017-10-28 DIAGNOSIS — R030 Elevated blood-pressure reading, without diagnosis of hypertension: Secondary | ICD-10-CM | POA: Diagnosis not present

## 2017-10-28 DIAGNOSIS — I517 Cardiomegaly: Secondary | ICD-10-CM | POA: Diagnosis not present

## 2017-10-28 DIAGNOSIS — R0789 Other chest pain: Secondary | ICD-10-CM | POA: Diagnosis not present

## 2017-10-28 LAB — COMPREHENSIVE METABOLIC PANEL
ALT: 11 U/L — ABNORMAL LOW (ref 14–54)
AST: 19 U/L (ref 15–41)
Albumin: 4.1 g/dL (ref 3.5–5.0)
Alkaline Phosphatase: 56 U/L (ref 38–126)
Anion gap: 5 (ref 5–15)
BUN: 25 mg/dL — ABNORMAL HIGH (ref 6–20)
CO2: 27 mmol/L (ref 22–32)
Calcium: 9 mg/dL (ref 8.9–10.3)
Chloride: 108 mmol/L (ref 101–111)
Creatinine, Ser: 0.86 mg/dL (ref 0.44–1.00)
GFR calc Af Amer: >60 mL/min (ref >60)
GFR calc non Af Amer: 59 mL/min — ABNORMAL LOW (ref >60)
Glucose, Bld: 99 mg/dL (ref 65–99)
Potassium: 3.6 mmol/L (ref 3.5–5.1)
Sodium: 140 mmol/L (ref 135–145)
Total Bilirubin: 0.7 mg/dL (ref 0.3–1.2)
Total Protein: 7.1 g/dL (ref 6.5–8.1)

## 2017-10-28 LAB — I-STAT TROPONIN, ED: TROPONIN I, POC: 0 ng/mL (ref 0.00–0.08)

## 2017-10-28 LAB — LIPASE, BLOOD: Lipase: 26 U/L (ref 11–51)

## 2017-10-28 MED ORDER — HYDRALAZINE HCL 20 MG/ML IJ SOLN
10.0000 mg | Freq: Once | INTRAMUSCULAR | Status: AC
  Administered 2017-10-28: 10 mg via INTRAVENOUS
  Filled 2017-10-28: qty 1

## 2017-10-28 MED ORDER — ONDANSETRON HCL 4 MG/2ML IJ SOLN
4.0000 mg | Freq: Once | INTRAMUSCULAR | Status: AC
  Administered 2017-10-28: 4 mg via INTRAVENOUS
  Filled 2017-10-28: qty 2

## 2017-10-28 MED ORDER — MORPHINE SULFATE (PF) 4 MG/ML IV SOLN
4.0000 mg | Freq: Once | INTRAVENOUS | Status: AC
  Administered 2017-10-28: 4 mg via INTRAVENOUS
  Filled 2017-10-28: qty 1

## 2017-10-28 MED ORDER — METOCLOPRAMIDE HCL 5 MG/ML IJ SOLN
10.0000 mg | Freq: Once | INTRAMUSCULAR | Status: AC
  Administered 2017-10-28: 10 mg via INTRAVENOUS
  Filled 2017-10-28: qty 2

## 2017-10-28 MED ORDER — DIPHENHYDRAMINE HCL 50 MG/ML IJ SOLN
12.5000 mg | Freq: Once | INTRAMUSCULAR | Status: AC
  Administered 2017-10-28: 12.5 mg via INTRAVENOUS
  Filled 2017-10-28: qty 1

## 2017-10-28 NOTE — ED Triage Notes (Addendum)
Per EMS, patient coming from [facility], new onset hypertension today. BP 210/10 at facility. Denies headache, weakness, blurred vision, dizziness, chest pain. A&Ox4. Ambulatory with assistance.  According to patient's chart, patient has h/o hypertension. Reports nausea x4 days. States "I know why I have been nauseous now. After looking over this paperwork, they have been giving me Remeron, which makes me nauseous so the doctor took me off of it."

## 2017-10-28 NOTE — ED Provider Notes (Signed)
[facility] DEPT Provider Note   CSN: [id number] Arrival date & time: 10/28/17  1949     History   Chief Complaint Chief Complaint  Patient presents with  . Hypertension    HPI Margaret Li is a 83 y.o. female with history of glaucoma, HTN, hypothyroidism, and neuropathy presents today with chief complaint acute onset, intermittent abdominal pain for 3 days.  She notes pain is crampy, intermittent, and primarily localized to the  lower abdomen.  She endorses constant nausea but denies vomiting.  She endorses subjective fevers but did not take her temperature.  She had one episode of nonbloody watery diarrhea today in the ED but otherwise stools have been normal.  Denies urinary symptoms.  No aggravating or alleviating factors noted.  She states this feels somewhat similar to her last diverticulitis flare.  She was also sent here from her nursing facility with new onset hypertension.  Her blood pressure in the facility was 210/100.  She has had her blood pressure medications per nursing documentation.  She endorses frontal headache for the past 2-3 days but denies new vision changes, slurred speech, numbness, tingling, or weakness.  She took a tablet of Tylenol for her headache today with some improvement. Denies any recent trauma or falls.  The history is provided by the patient.    Past Medical History:  Diagnosis Date  . Black tarry stools    "per patient"  . Glaucoma   . Hypertension   . Hypothyroidism     Patient Active Problem List   Diagnosis Date Noted  . Vision loss, left eye 02/17/2017  . Visual hallucinations 02/17/2017  . Neuropathy 02/17/2017  . Acute blood loss anemia 06/14/2015  . Essential hypertension 06/14/2015  . Hypothyroidism 06/14/2015  . Glaucoma 06/14/2015  . Rectal bleeding 06/13/2015    Past Surgical History:  Procedure Laterality Date  . ABDOMINAL HYSTERECTOMY     80's  . CATARACT EXTRACTION, BILATERAL    .  ESOPHAGOGASTRODUODENOSCOPY (EGD) WITH PROPOFOL N/A 08/04/2016   Procedure: ESOPHAGOGASTRODUODENOSCOPY (EGD) WITH PROPOFOL;  Surgeon: Carol Ada, MD;  Location: [facility] ENDOSCOPY;  Service: Endoscopy;  Laterality: N/A;  . HERNIA REPAIR Right    Inguinal hernia repair '12 -Dr. Rise Patience  . JOINT REPLACEMENT Right    -RTHA -4 '11 - Dr. [name]- [facility]    OB History    No data available       Home Medications    Prior to Admission medications   Medication Sig Start Date End Date Taking? Authorizing Provider  aspirin EC 81 MG tablet Take 81 mg by mouth daily.    [provider]  bimatoprost (LUMIGAN) 0.01 % SOLN Place 1 drop into both eyes at bedtime.    [provider]  brinzolamide (AZOPT) 1 % ophthalmic suspension Place 1 drop into both eyes 2 (two) times daily.    [provider]  Cholecalciferol (VITAMIN D-3) 1000 units CAPS Take 1 capsule by mouth daily.    [provider]  dorzolamide-timolol (COSOPT) 22.3-6.8 MG/ML ophthalmic solution Place 1 drop into both eyes 2 (two) times daily.    [provider]  fluticasone (FLONASE) 50 MCG/ACT nasal spray Place 1 spray into both nostrils daily as needed for allergies or rhinitis.    [provider]  labetalol (NORMODYNE) 100 MG tablet Take 100 mg by mouth daily.     [provider]  LORazepam (ATIVAN) 2 MG tablet Take 2 mg by mouth at bedtime as needed for anxiety  or sleep.  04/27/15   [provider]  losartan (COZAAR) 50 MG tablet Take 100 mg by mouth daily.  06/01/15   [provider]  Lutein 20 MG TABS Take 20 mg by mouth daily.    [provider]  Multiple Vitamin (MULTIVITAMIN WITH MINERALS) TABS tablet Take 1 tablet by mouth daily.    [provider]  pilocarpine (PILOCAR) 2 % ophthalmic solution Place 1 drop into both eyes 2 (two) times daily.    [provider]  potassium chloride SA (K-DUR,KLOR-CON) 20 MEQ tablet Take 1 tablet (20 mEq  total) by mouth daily. Patient taking differently: Take 20 mEq by mouth daily. Not taking now 06/18/15 06/21/15  Oswald Hillock, MD  simvastatin (ZOCOR) 10 MG tablet Take 5 mg by mouth daily at 6 PM.  05/21/15   [provider]  SYNTHROID 25 MCG tablet Take 25 mcg by mouth daily. 05/12/15   [provider]  vitamin C (ASCORBIC ACID) 500 MG tablet Take 500 mg by mouth daily.    [provider]    Family History No family history on file.  Social History Social History   Tobacco Use  . Smoking status: Former Smoker    Last attempt to quit: 08/01/2011    Years since quitting: 6.2  . Smokeless tobacco: Never Used  Substance Use Topics  . Alcohol use: No  . Drug use: No     Allergies   Sulfa antibiotics   Review of Systems Review of Systems  Constitutional: Positive for appetite change and fever.  Eyes: Negative for visual disturbance.  Respiratory: Negative for shortness of breath.   Cardiovascular: Negative for chest pain.  Gastrointestinal: Positive for abdominal pain, diarrhea and nausea. Negative for blood in stool, constipation and vomiting.  Genitourinary: Negative for decreased urine volume, dysuria and hematuria.  Musculoskeletal: Negative for back pain.  Neurological: Positive for headaches. Negative for syncope, weakness and numbness.  All other systems reviewed and are negative.    Physical Exam Updated Vital Signs BP (!) 164/93   Pulse 75   Temp 98.5 F (36.9 C) (Oral)   Resp 16   SpO2 97%   Physical Exam  Constitutional: She appears well-developed and well-nourished. No distress.  HENT:  Head: Normocephalic and atraumatic.  Eyes: Conjunctivae and EOM are normal. Pupils are equal, round, and reactive to light. Right eye exhibits no discharge. Left eye exhibits no discharge.  Neck: Normal range of motion. Neck supple. No JVD present. No tracheal deviation present.  Cardiovascular: Normal rate, regular rhythm, normal heart sounds and  intact distal pulses.  Pulmonary/Chest: Effort normal and breath sounds normal. No stridor. No respiratory distress. She has no wheezes. She has no rales. She exhibits no tenderness.  Abdominal: Soft. She exhibits no distension. There is tenderness.  Hyperactive bowel sounds, maximally tender to palpation in the left lower quadrant with mild suprapubic tenderness.  Murphy sign absent, Rovsing's absent, no CVA tenderness.  Musculoskeletal: Normal range of motion. She exhibits no edema or tenderness.  Lymphadenopathy:    She has no cervical adenopathy.  Neurological: She is alert. A sensory deficit is present. No cranial nerve deficit. She exhibits normal muscle tone.  Fluent speech, no facial droop, slightly decreased sensation of the bilateral lower extremities to soft touch.  Patient states this is chronic and unchanged.  She is ambulatory without difficulty.  Cranial nerves II through XII tested and intact.  Skin: Skin is warm and dry. No erythema.  Psychiatric: She  has a normal mood and affect. Her behavior is normal.  Nursing note and vitals reviewed.    ED Treatments / Results  Labs (all labs ordered are listed, but only abnormal results are displayed) Labs Reviewed  CBC WITH DIFFERENTIAL/PLATELET - Abnormal; Notable for the following components:      Result Value   RBC 3.59 (*)    Hemoglobin 10.8 (*)    HCT 33.9 (*)    All other components within normal limits  URINALYSIS, ROUTINE W REFLEX MICROSCOPIC - Abnormal; Notable for the following components:   Hgb urine dipstick SMALL (*)    Leukocytes, UA MODERATE (*)    Bacteria, UA FEW (*)    Squamous Epithelial / LPF 0-5 (*)    All other components within normal limits  COMPREHENSIVE METABOLIC PANEL - Abnormal; Notable for the following components:   BUN 25 (*)    ALT 11 (*)    GFR calc non Af Amer 59 (*)    All other components within normal limits  URINE CULTURE  LIPASE, BLOOD  I-STAT TROPONIN, ED    EKG  EKG  Interpretation  Date/Time:  Sunday October 28 2017 22:16:22 EST Ventricular Rate:  68 PR Interval:    QRS Duration: 136 QT Interval:  456 QTC Calculation: 485 R Axis:   -59 Text Interpretation:  Sinus rhythm Ventricular premature complex RBBB and LAFB Left ventricular hypertrophy PVC new since previous Confirmed by Wandra Arthurs [phone]) on 10/28/2017 10:19:55 PM       Radiology Dg Chest 2 View  Result Date: 10/28/2017 CLINICAL DATA:  Initial evaluation for acute hypertension. EXAM: CHEST  2 VIEW COMPARISON:  Prior radiograph from 02/16/2010. FINDINGS: Mild cardiomegaly. Mediastinal silhouette within normal limits. Tortuosity the intrathoracic aorta noted. Lungs mildly hypoinflated. No focal infiltrates. No pulmonary edema or pleural effusion. No pneumothorax. No acute osseus abnormality. IMPRESSION: 1. No active cardiopulmonary disease. 2. Mild cardiomegaly with tortuosity the intrathoracic aorta. Electronically Signed   By: Jeannine Boga M.D.   On: 10/28/2017 22:40   Ct Head Wo Contrast  Result Date: 10/29/2017 CLINICAL DATA:  83 year old female with sudden onset hypertension and nausea. Concern for intracranial hemorrhage. EXAM: CT HEAD WITHOUT CONTRAST TECHNIQUE: Contiguous axial images were obtained from the base of the skull through the vertex without intravenous contrast. COMPARISON:  Brain MRI dated 02/19/2017 FINDINGS: Brain: The ventricles and sulci appropriate size for patient's age. Moderate periventricular and deep white matter chronic microvascular ischemic changes noted. There is no acute intracranial hemorrhage. No mass effect or midline shift. No extra-axial fluid collection. Vascular: No hyperdense vessel or unexpected calcification. Skull: Diffuse tiny lucencies throughout the skull with "Salt and pepper" appearance which may be related to osteopenia or secondary to hyperparathyroidism. Clinical correlation is recommended. No acute calvarial pathology. Sinuses/Orbits:  The visualized paranasal sinuses are clear. Mild right mastoid effusion. The left mastoid air cells are clear. Other: None IMPRESSION: 1. No acute intracranial hemorrhage. 2. Moderate chronic microvascular ischemic changes. Electronically Signed   By: Anner Crete M.D.   On: 10/29/2017 00:50    Procedures Procedures (including critical care time)  Medications Ordered in ED Medications  labetalol (NORMODYNE,TRANDATE) injection 20 mg (not administered)  ondansetron (ZOFRAN) injection 4 mg (4 mg Intravenous Given 10/28/17 2309)  morphine 4 MG/ML injection 4 mg (4 mg Intravenous Given 10/28/17 2309)  metoCLOPramide (REGLAN) injection 10 mg (10 mg Intravenous Given 10/28/17 2309)  diphenhydrAMINE (BENADRYL) injection 12.5 mg (12.5 mg Intravenous Given 10/28/17 2309)  hydrALAZINE (APRESOLINE) injection 10  mg (10 mg Intravenous Given 10/28/17 2344)  iopamidol (ISOVUE-370) 76 % injection 100 mL (100 mLs Intravenous Contrast Given 10/29/17 0042)     Initial Impression / Assessment and Plan / ED Course  I have reviewed the triage vital signs and the nursing notes.  Pertinent labs & imaging results that were available during my care of the patient were reviewed by me and considered in my medical decision making (see chart for details).    Patient presents with 3 days of intermittent abdominal pain and nausea as well as one episode of diarrhea earlier today.  She has left lower quadrant and epigastric tenderness to palpation.  She is nontoxic in appearance.   Nursing home brought patient to the ED for evaluation of acute hypertension.  Blood pressure initially elevated at 209/108 with improvement after administration of hydralazine and labetalol.  Chest x-ray shows mild cardiomegaly with tortuosity of the intrathoracic aorta.  This coupled with her hypertension is concerning for dissection.Chest x-ray shows mild cardiomegaly and tortuosity of the no leukocytosis, no significant anemia, and her CMP  is stable.  UA is unconcerning for UTI or nephrolithiasis.  She has no urinary symptoms, however with suprapubic pain we will culture urine.   1:04 AM Signed out to oncoming PA Kirichenko.  Awaiting CTA of the chest, abdomen, and pelvis for evaluation of dissection versus intra-abdominal pathology.  CT of the head shows no acute abnormalities and I doubt CVA, SAH, or ICH.  I doubt obstruction, perforation, or appendicitis.  If CTA shows acute dissection, patient will require further evaluation and admission into the hospital.  If imaging shows diverticulitis or other acute intra-abdominal pathology, patient will require treatment for this and likely stable for discharge home with PCP follow-up outpatient and medication management of her hypertension.  Patient discussed with Dr. Darl Householder who agrees with assessment and plan at this time.   Final Clinical Impressions(s) / ED Diagnoses   Final diagnoses:  Lower abdominal pain  Severe hypertension    ED Discharge Orders    None       Renita Papa, PA-C 10/29/17 0106    Drenda Freeze, MD 10/29/17 [phone]

## 2017-10-29 ENCOUNTER — Emergency Department (HOSPITAL_COMMUNITY): Payer: Medicare Other

## 2017-10-29 ENCOUNTER — Encounter (HOSPITAL_COMMUNITY): Payer: Self-pay

## 2017-10-29 DIAGNOSIS — I10 Essential (primary) hypertension: Secondary | ICD-10-CM | POA: Diagnosis not present

## 2017-10-29 DIAGNOSIS — R110 Nausea: Secondary | ICD-10-CM | POA: Diagnosis not present

## 2017-10-29 DIAGNOSIS — R0789 Other chest pain: Secondary | ICD-10-CM | POA: Diagnosis not present

## 2017-10-29 DIAGNOSIS — K5730 Diverticulosis of large intestine without perforation or abscess without bleeding: Secondary | ICD-10-CM | POA: Diagnosis not present

## 2017-10-29 DIAGNOSIS — I6789 Other cerebrovascular disease: Secondary | ICD-10-CM | POA: Diagnosis not present

## 2017-10-29 DIAGNOSIS — R51 Headache: Secondary | ICD-10-CM | POA: Diagnosis not present

## 2017-10-29 LAB — CBC WITH DIFFERENTIAL/PLATELET
Basophils Absolute: 0 10*3/uL (ref 0.0–0.1)
Basophils Relative: 0 %
Eosinophils Absolute: 0.2 10*3/uL (ref 0.0–0.7)
Eosinophils Relative: 4 %
HCT: 33.9 % — ABNORMAL LOW (ref 36.0–46.0)
Hemoglobin: 10.8 g/dL — ABNORMAL LOW (ref 12.0–15.0)
Lymphocytes Relative: 21 %
Lymphs Abs: 1.1 10*3/uL (ref 0.7–4.0)
MCH: 30.1 pg (ref 26.0–34.0)
MCHC: 31.9 g/dL (ref 30.0–36.0)
MCV: 94.4 fL (ref 78.0–100.0)
Monocytes Absolute: 0.7 10*3/uL (ref 0.1–1.0)
Monocytes Relative: 13 %
Neutro Abs: 3.2 10*3/uL (ref 1.7–7.7)
Neutrophils Relative %: 62 %
Platelets: 201 10*3/uL (ref 150–400)
RBC: 3.59 MIL/uL — ABNORMAL LOW (ref 3.87–5.11)
RDW: 13.7 % (ref 11.5–15.5)
WBC: 5.2 10*3/uL (ref 4.0–10.5)

## 2017-10-29 LAB — URINALYSIS, ROUTINE W REFLEX MICROSCOPIC
Bilirubin Urine: NEGATIVE
Glucose, UA: NEGATIVE mg/dL
Ketones, ur: NEGATIVE mg/dL
Nitrite: NEGATIVE
Protein, ur: NEGATIVE mg/dL
Specific Gravity, Urine: 1.005 (ref 1.005–1.030)
pH: 6 (ref 5.0–8.0)

## 2017-10-29 MED ORDER — IOPAMIDOL (ISOVUE-370) INJECTION 76%
100.0000 mL | Freq: Once | INTRAVENOUS | Status: AC | PRN
  Administered 2017-10-29: 100 mL via INTRAVENOUS

## 2017-10-29 MED ORDER — IOPAMIDOL (ISOVUE-370) INJECTION 76%
INTRAVENOUS | Status: AC
  Administered 2017-10-29: 100 mL via INTRAVENOUS
  Filled 2017-10-29: qty 100

## 2017-10-29 MED ORDER — LABETALOL HCL 5 MG/ML IV SOLN
20.0000 mg | Freq: Once | INTRAVENOUS | Status: AC
  Administered 2017-10-29: 20 mg via INTRAVENOUS
  Filled 2017-10-29: qty 4

## 2017-10-29 NOTE — ED Notes (Signed)
ED Provider at bedside. 

## 2017-10-29 NOTE — Discharge Instructions (Signed)
Drink plenty of fluids and get plenty of rest.  Take Zofran as needed for nausea.  Advance diet slowly, eating a diet of bland foods that will not upset the stomach. Follow up with your PCP in the next 2-3 days for re-evaluation.  They may want to change some of your blood pressure medications.  Return to the ED if any concerning signs or symptoms develop such as fever, chest pain, or if you are unable to keep any food or drink down.

## 2017-10-29 NOTE — ED Notes (Signed)
[facility] contacted for discharge transport 

## 2017-10-29 NOTE — ED Provider Notes (Signed)
2:24 AM Patient signed out to me at shift change.  Patient is coming from skilled nursing facility for lower abdominal pain.  Patient states that her pain is crampy, intermittent, located in the left lower abdomen.  Denies any diarrhea to me.  Reports nausea but no vomiting.  Blood pressure noted to be elevated at the facility, with blood pressure of 210/100.  Patient had workup including labs, CT head, CT angios chest abdomen pelvis to rule out dissection in setting of elevated blood pressure and abdominal pain.  Results for orders placed or performed during the hospital encounter of 10/28/17  CBC with Differential  Result Value Ref Range   WBC 5.2 4.0 - 10.5 K/uL   RBC 3.59 (L) 3.87 - 5.11 MIL/uL   Hemoglobin 10.8 (L) 12.0 - 15.0 g/dL   HCT 33.9 (L) 36.0 - 46.0 %   MCV 94.4 78.0 - 100.0 fL   MCH 30.1 26.0 - 34.0 pg   MCHC 31.9 30.0 - 36.0 g/dL   RDW 13.7 11.5 - 15.5 %   Platelets 201 150 - 400 K/uL   Neutrophils Relative % 62 %   Neutro Abs 3.2 1.7 - 7.7 K/uL   Lymphocytes Relative 21 %   Lymphs Abs 1.1 0.7 - 4.0 K/uL   Monocytes Relative 13 %   Monocytes Absolute 0.7 0.1 - 1.0 K/uL   Eosinophils Relative 4 %   Eosinophils Absolute 0.2 0.0 - 0.7 K/uL   Basophils Relative 0 %   Basophils Absolute 0.0 0.0 - 0.1 K/uL  Urinalysis, Routine w reflex microscopic  Result Value Ref Range   Color, Urine YELLOW YELLOW   APPearance CLEAR CLEAR   Specific Gravity, Urine 1.005 1.005 - 1.030   pH 6.0 5.0 - 8.0   Glucose, UA NEGATIVE NEGATIVE mg/dL   Hgb urine dipstick SMALL (A) NEGATIVE   Bilirubin Urine NEGATIVE NEGATIVE   Ketones, ur NEGATIVE NEGATIVE mg/dL   Protein, ur NEGATIVE NEGATIVE mg/dL   Nitrite NEGATIVE NEGATIVE   Leukocytes, UA MODERATE (A) NEGATIVE   RBC / HPF 0-5 0 - 5 RBC/hpf   WBC, UA 6-30 0 - 5 WBC/hpf   Bacteria, UA FEW (A) NONE SEEN   Squamous Epithelial / LPF 0-5 (A) NONE SEEN  Comprehensive metabolic panel  Result Value Ref Range   Sodium 140 135 - 145 mmol/L    Potassium 3.6 3.5 - 5.1 mmol/L   Chloride 108 101 - 111 mmol/L   CO2 27 22 - 32 mmol/L   Glucose, Bld 99 65 - 99 mg/dL   BUN 25 (H) 6 - 20 mg/dL   Creatinine, Ser 0.86 0.44 - 1.00 mg/dL   Calcium 9.0 8.9 - 10.3 mg/dL   Total Protein 7.1 6.5 - 8.1 g/dL   Albumin 4.1 3.5 - 5.0 g/dL   AST 19 15 - 41 U/L   ALT 11 (L) 14 - 54 U/L   Alkaline Phosphatase 56 38 - 126 U/L   Total Bilirubin 0.7 0.3 - 1.2 mg/dL   GFR calc non Af Amer 59 (L) >60 mL/min   GFR calc Af Amer >60 >60 mL/min   Anion gap 5 5 - 15  Lipase, blood  Result Value Ref Range   Lipase 26 11 - 51 U/L  I-stat troponin, ED  Result Value Ref Range   Troponin i, poc 0.00 0.00 - 0.08 ng/mL   Comment 3           Dg Chest 2 View  Result Date: 10/28/2017 CLINICAL  DATA:  Initial evaluation for acute hypertension. EXAM: CHEST  2 VIEW COMPARISON:  Prior radiograph from 02/16/2010. FINDINGS: Mild cardiomegaly. Mediastinal silhouette within normal limits. Tortuosity the intrathoracic aorta noted. Lungs mildly hypoinflated. No focal infiltrates. No pulmonary edema or pleural effusion. No pneumothorax. No acute osseus abnormality. IMPRESSION: 1. No active cardiopulmonary disease. 2. Mild cardiomegaly with tortuosity the intrathoracic aorta. Electronically Signed   By: Jeannine Boga M.D.   On: 10/28/2017 22:40   Ct Head Wo Contrast  Result Date: 10/29/2017 CLINICAL DATA:  83 year old female with sudden onset hypertension and nausea. Concern for intracranial hemorrhage. EXAM: CT HEAD WITHOUT CONTRAST TECHNIQUE: Contiguous axial images were obtained from the base of the skull through the vertex without intravenous contrast. COMPARISON:  Brain MRI dated 02/19/2017 FINDINGS: Brain: The ventricles and sulci appropriate size for patient's age. Moderate periventricular and deep white matter chronic microvascular ischemic changes noted. There is no acute intracranial hemorrhage. No mass effect or midline shift. No extra-axial fluid collection.  Vascular: No hyperdense vessel or unexpected calcification. Skull: Diffuse tiny lucencies throughout the skull with "Salt and pepper" appearance which may be related to osteopenia or secondary to hyperparathyroidism. Clinical correlation is recommended. No acute calvarial pathology. Sinuses/Orbits: The visualized paranasal sinuses are clear. Mild right mastoid effusion. The left mastoid air cells are clear. Other: None IMPRESSION: 1. No acute intracranial hemorrhage. 2. Moderate chronic microvascular ischemic changes. Electronically Signed   By: Anner Crete M.D.   On: 10/29/2017 00:50   Ct Angio Chest/abd/pel For Dissection W And/or Wo Contrast  Result Date: 10/29/2017 CLINICAL DATA:  Acute onset of nausea. High blood pressure. Red blood cells and white blood cells in the urine. EXAM: CT ANGIOGRAPHY CHEST, ABDOMEN AND PELVIS TECHNIQUE: Multidetector CT imaging through the chest, abdomen and pelvis was performed using the standard protocol during bolus administration of intravenous contrast. Multiplanar reconstructed images and MIPs were obtained and reviewed to evaluate the vascular anatomy. CONTRAST:  100 mL of Isovue 370 IV contrast COMPARISON:  None. FINDINGS: CTA CHEST FINDINGS Cardiovascular: There is no evidence of aortic dissection. There is no evidence of aneurysmal dilatation. The great vessels are grossly unremarkable in appearance. Scattered calcification is seen along the descending thoracic aorta and aortic arch. The heart is borderline normal in size. There is no evidence of central pulmonary embolus. Mediastinum/Nodes: No mediastinal lymphadenopathy is seen. No pericardial effusion is identified. Scattered peripherally enhancing nodules within the thyroid gland measure up to 1.4 cm in size, likely benign given their size. No axillary lymphadenopathy is seen. Lungs/Pleura: Trace bilateral pleural fluid is noted. Mild bibasilar atelectasis is seen. No pneumothorax is identified. No masses are  seen. Musculoskeletal: No acute osseous abnormalities are identified. The visualized musculature is unremarkable in appearance. Bilateral breast implants are grossly unremarkable in appearance. Review of the MIP images confirms the above findings. CTA ABDOMEN AND PELVIS FINDINGS VASCULAR Aorta: There is aneurysmal dilatation of the infrarenal abdominal aorta to 3.2 cm in maximal AP dimension. There is no evidence of aortic dissection. Scattered calcification is seen along the abdominal aorta and its branches. The abdominal aorta is somewhat tortuous in appearance. Celiac: The celiac trunk remains patent, though slight narrowing is suggested at the proximal celiac trunk. SMA: The superior mesenteric artery is patent, with mild calcification at the origin of the superior mesenteric artery. Renals: The renal arteries appear patent bilaterally, with scattered calcification noted along the proximal renal arteries. IMA: The inferior mesenteric artery appears grossly patent. Inflow: Scattered calcification is noted along the common,  external and internal iliac arteries bilaterally. The common femoral arteries appear patent bilaterally. Veins: Visualized venous structures are grossly unremarkable, though difficult to fully assess given the phase of contrast enhancement. Review of the MIP images confirms the above findings. NON-VASCULAR Hepatobiliary: The liver is unremarkable in appearance. The gallbladder is unremarkable in appearance. The common bile duct remains normal in caliber. Pancreas: The pancreas is not well assessed due to surrounding structures. The pancreatic duct is distended to 6 mm in diameter, of uncertain significance. Spleen: The spleen is unremarkable in appearance. Adrenals/Urinary Tract: The adrenal glands unremarkable in appearance. Bilateral renal cysts are noted. Mild bilateral renal atrophy is suggested. There is no evidence of hydronephrosis. No renal or ureteral stones are identified. No  significant perinephric stranding is seen. Stomach/Bowel: The stomach is unremarkable in appearance. The small bowel is within normal limits. The appendix is normal in caliber, without evidence of appendicitis. Diffuse diverticulosis is noted along the sigmoid colon, without evidence of diverticulitis. Lymphatic: No retroperitoneal or pelvic sidewall lymphadenopathy is seen. Reproductive: The bladder is mildly distended and grossly unremarkable. The patient is status post hysterectomy. No suspicious adnexal masses are seen. Other: No additional soft tissue abnormalities are seen. Musculoskeletal: No acute osseous abnormalities are identified. There is chronic loss of height of vertebral body L1. Multilevel vacuum phenomenon is noted along the lower thoracic and lumbar spine, with endplate sclerotic change. The patient's right hip arthroplasty is incompletely imaged, but appears grossly unremarkable. The visualized musculature is unremarkable in appearance. Review of the MIP images confirms the above findings. IMPRESSION: 1. No evidence of aortic dissection. 2. Mild aneurysmal dilatation of the infrarenal abdominal aorta to 3.2 cm in AP dimension. Recommend followup by ultrasound in 3 years. This recommendation follows ACR consensus guidelines: White Paper of the ACR Incidental Findings Committee II on Vascular Findings. J Am Coll Radiol 2013; 16:967-893 3. No evidence of central pulmonary embolus. 4. Trace bilateral pleural fluid, with mild bibasilar atelectasis. 5. Dilatation of the pancreatic duct to 6 mm in maximal diameter, of uncertain significance. The pancreas is not well assessed due to surrounding structures and the phase of contrast enhancement. Would correlate with pancreatic labs, if deemed clinically appropriate. 6. Mild bilateral atrophy suggested.  Bilateral renal cysts seen. 7. Diffuse diverticulosis along the sigmoid colon, without evidence of diverticulitis. 8. Degenerative change along the lower  thoracic and lumbar spine, with chronic loss of height at vertebral body L1. Electronically Signed   By: Garald Balding M.D.   On: 10/29/2017 01:23    I reassessed patient, patient is in no acute distress.  Vital signs show still elevated blood pressure 176/106.  Patient has had hydralazine and labetalol.  Patient is currently not having abdominal pain, there is no tenderness on exam.  Urinalysis appears to be contaminated with moderate leukocytes, 6-30 WBCs, few bacteria, with some epithelial cells.  Patient denies any urinary symptoms.  Urine was sent for culture.  Will hold off on treating.  Patient's lab work is otherwise unremarkable.  No elevated WBC.  Patient CT head was negative.  CT abdomen showed no acute findings.  There was no aortic dissection.  There is a mild aneurysmal dilation of infrarenal abdominal aorta, follow-up in 3 years is recommended.  There was a mention of dilation of pancreatic duct to 6 mm, patient has normal lipase and no left upper quadrant abdominal tenderness on my exam.  Doubt it is of any significance at this time.  No signs of diverticulitis.  At this time I discussed all of the results with patient.  I believe the patient is stable for discharge home.  She will need to follow-up closely with her family doctor regarding her blood pressure.  At this time she is asymptomatic.  She is very anxious.  We discussed signs and symptoms that should prompt her return to emergency department and she agreed.  Vitals:   10/28/17 2330 10/29/17 0000 10/29/17 0100 10/29/17 0130  BP: (!) 186/106 (!) 164/93 (!) 191/115 (!) 176/106  Pulse: 72 75 86 88  Resp: 14 16 17 16   Temp:      TempSrc:      SpO2: 98% 97% 97% 96%      Jeannett Senior, PA-C 10/29/17 [id number]

## 2017-10-31 DIAGNOSIS — I10 Essential (primary) hypertension: Secondary | ICD-10-CM | POA: Diagnosis not present

## 2017-10-31 DIAGNOSIS — I714 Abdominal aortic aneurysm, without rupture: Secondary | ICD-10-CM | POA: Diagnosis not present

## 2017-10-31 DIAGNOSIS — F332 Major depressive disorder, recurrent severe without psychotic features: Secondary | ICD-10-CM | POA: Diagnosis not present

## 2017-10-31 DIAGNOSIS — R8290 Unspecified abnormal findings in urine: Secondary | ICD-10-CM | POA: Diagnosis not present

## 2017-10-31 DIAGNOSIS — F22 Delusional disorders: Secondary | ICD-10-CM | POA: Diagnosis not present

## 2017-10-31 DIAGNOSIS — N390 Urinary tract infection, site not specified: Secondary | ICD-10-CM | POA: Diagnosis not present

## 2017-11-01 LAB — URINE CULTURE: Culture: >=100000 — AB

## 2017-11-02 ENCOUNTER — Telehealth: Payer: Self-pay | Admitting: Emergency Medicine

## 2017-11-02 NOTE — Telephone Encounter (Signed)
Post ED Visit - Positive Culture Follow-up  Culture report reviewed by antimicrobial stewardship pharmacist:  []  Elenor Quinones, Pharm.D. []  Heide Guile, Pharm.D., BCPS AQ-ID []  Parks Neptune, Pharm.D., BCPS []  Alycia Rossetti, Pharm.D., BCPS []  [place], [place].D., BCPS, AAHIVP [x]  Legrand Como, Pharm.D., BCPS, AAHIVP []  Salome Arnt, PharmD, BCPS []  Dimitri Ped, PharmD, BCPS []  Vincenza Hews, PharmD, BCPS  Positive urine culture Treated with none, asymptomatic, no further patient follow-up is required at this time.  Hazle Nordmann 11/02/2017, 1:16 PM

## 2017-11-02 NOTE — Progress Notes (Signed)
ED Antimicrobial Stewardship Positive Culture Follow Up   Margaret Li is an 83 y.o. female who presented to [facility] on 10/28/2017 with a chief complaint of  Chief Complaint  Patient presents with  . Hypertension    Recent Results (from the past 720 hour(s))  Urine culture     Status: Abnormal   Collection Time: 10/28/17  9:32 PM  Result Value Ref Range Status   Specimen Description URINE, CLEAN CATCH  Final   Special Requests NONE  Final   Culture (A)  Final    >=100,000 COLONIES/mL ESCHERICHIA COLI 50,000 COLONIES/mL ENTEROCOCCUS FAECALIS    Report Status 11/01/2017 FINAL  Final   Organism ID, Bacteria ESCHERICHIA COLI (A)  Final   Organism ID, Bacteria ENTEROCOCCUS FAECALIS (A)  Final      Susceptibility   Escherichia coli - MIC*    AMPICILLIN 4 SENSITIVE Sensitive     CEFAZOLIN <=4 SENSITIVE Sensitive     CEFTRIAXONE <=1 SENSITIVE Sensitive     CIPROFLOXACIN <=0.25 SENSITIVE Sensitive     GENTAMICIN <=1 SENSITIVE Sensitive     IMIPENEM <=0.25 SENSITIVE Sensitive     NITROFURANTOIN <=16 SENSITIVE Sensitive     TRIMETH/SULFA <=20 SENSITIVE Sensitive     AMPICILLIN/SULBACTAM <=2 SENSITIVE Sensitive     PIP/TAZO <=4 SENSITIVE Sensitive     Extended ESBL NEGATIVE Sensitive     * >=100,000 COLONIES/mL ESCHERICHIA COLI   Enterococcus faecalis - MIC*    AMPICILLIN <=2 SENSITIVE Sensitive     LEVOFLOXACIN 1 SENSITIVE Sensitive     NITROFURANTOIN <=16 SENSITIVE Sensitive     VANCOMYCIN 1 SENSITIVE Sensitive     * 50,000 COLONIES/mL ENTEROCOCCUS FAECALIS   No urinary symptoms reported, UA appears contaminated Asymptomatic bacteriuria - no treatment required  ED Provider: Curlene Labrum, PA-C   Norva Riffle 11/02/2017, 8:26 AM Infectious Diseases Pharmacist Phone# [phone]

## 2017-11-07 DIAGNOSIS — I10 Essential (primary) hypertension: Secondary | ICD-10-CM | POA: Diagnosis not present

## 2017-11-07 DIAGNOSIS — N390 Urinary tract infection, site not specified: Secondary | ICD-10-CM | POA: Diagnosis not present

## 2017-11-07 DIAGNOSIS — R413 Other amnesia: Secondary | ICD-10-CM | POA: Diagnosis not present

## 2017-11-08 ENCOUNTER — Ambulatory Visit: Payer: Self-pay | Admitting: Neurology

## 2017-11-08 ENCOUNTER — Telehealth: Payer: Self-pay | Admitting: Neurology

## 2017-11-08 ENCOUNTER — Telehealth: Payer: Self-pay

## 2017-11-08 NOTE — Telephone Encounter (Signed)
Pt did not show for their appt with Dr. Jaynee Eagles today.

## 2017-11-08 NOTE — Telephone Encounter (Signed)
Mary-Katherine Dr. Inda Merlin RN is wanting to discuss the pt. Please call back at [phone]

## 2017-11-13 ENCOUNTER — Ambulatory Visit: Payer: Self-pay | Admitting: Neurology

## 2017-11-14 NOTE — Telephone Encounter (Addendum)
Noted that appt has already been r/s. Dr. Jaynee Eagles aware. Number for Glory Buff given to Dr. Jaynee Eagles for return call.

## 2017-11-21 DIAGNOSIS — H401123 Primary open-angle glaucoma, left eye, severe stage: Secondary | ICD-10-CM | POA: Diagnosis not present

## 2017-11-21 DIAGNOSIS — H401112 Primary open-angle glaucoma, right eye, moderate stage: Secondary | ICD-10-CM | POA: Diagnosis not present

## 2017-11-21 DIAGNOSIS — H401413 Capsular glaucoma with pseudoexfoliation of lens, right eye, severe stage: Secondary | ICD-10-CM | POA: Diagnosis not present

## 2017-11-21 DIAGNOSIS — H401412 Capsular glaucoma with pseudoexfoliation of lens, right eye, moderate stage: Secondary | ICD-10-CM | POA: Diagnosis not present

## 2017-11-22 DIAGNOSIS — M25551 Pain in right hip: Secondary | ICD-10-CM | POA: Diagnosis not present

## 2017-11-22 DIAGNOSIS — Z96649 Presence of unspecified artificial hip joint: Secondary | ICD-10-CM | POA: Diagnosis not present

## 2017-11-28 ENCOUNTER — Ambulatory Visit: Payer: Medicare HMO | Admitting: Neurology

## 2017-11-28 ENCOUNTER — Encounter: Payer: Self-pay | Admitting: Neurology

## 2017-11-28 VITALS — BP 209/113 | HR 70 | Ht 65.0 in | Wt 126.6 lb

## 2017-11-28 DIAGNOSIS — R443 Hallucinations, unspecified: Secondary | ICD-10-CM

## 2017-11-28 DIAGNOSIS — R4189 Other symptoms and signs involving cognitive functions and awareness: Secondary | ICD-10-CM

## 2017-11-28 DIAGNOSIS — E538 Deficiency of other specified B group vitamins: Secondary | ICD-10-CM | POA: Diagnosis not present

## 2017-11-28 DIAGNOSIS — R4689 Other symptoms and signs involving appearance and behavior: Secondary | ICD-10-CM | POA: Diagnosis not present

## 2017-11-28 DIAGNOSIS — Z79899 Other long term (current) drug therapy: Secondary | ICD-10-CM | POA: Diagnosis not present

## 2017-11-28 NOTE — Patient Instructions (Signed)
Labs today Formal Neurocognitive Testing
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
## 2017-11-29 ENCOUNTER — Telehealth: Payer: Self-pay | Admitting: *Deleted

## 2017-11-29 NOTE — Telephone Encounter (Signed)
Spoke with patient that her labs are unremarkable. She verbalized understanding, appreciation.

## 2017-12-01 LAB — CBC
HEMATOCRIT: 35.7 % (ref 34.0–46.6)
HEMOGLOBIN: 11.8 g/dL (ref 11.1–15.9)
MCH: 30.9 pg (ref 26.6–33.0)
MCHC: 33.1 g/dL (ref 31.5–35.7)
MCV: 94 fL (ref 79–97)
Platelets: 231 10*3/uL (ref 150–379)
RBC: 3.82 x10E6/uL (ref 3.77–5.28)
RDW: 14.8 % (ref 12.3–15.4)
WBC: 5.6 10*3/uL (ref 3.4–10.8)

## 2017-12-01 LAB — COMPREHENSIVE METABOLIC PANEL
ALBUMIN: 4.1 g/dL (ref 3.5–4.7)
ALK PHOS: 60 IU/L (ref 39–117)
ALT: 11 IU/L (ref 0–32)
AST: 17 IU/L (ref 0–40)
Albumin/Globulin Ratio: 1.4 (ref 1.2–2.2)
BILIRUBIN TOTAL: 0.6 mg/dL (ref 0.0–1.2)
BUN/Creatinine Ratio: 24 (ref 12–28)
BUN: 18 mg/dL (ref 8–27)
CHLORIDE: 105 mmol/L (ref 96–106)
CO2: 25 mmol/L (ref 20–29)
CREATININE: 0.76 mg/dL (ref 0.57–1.00)
Calcium: 9.2 mg/dL (ref 8.7–10.3)
GFR calc Af Amer: 82 mL/min/{1.73_m2} (ref >59)
GFR calc non Af Amer: 71 mL/min/{1.73_m2} (ref >59)
GLUCOSE: 106 mg/dL — AB (ref 65–99)
Globulin, Total: 3 g/dL (ref 1.5–4.5)
Potassium: 4 mmol/L (ref 3.5–5.2)
Sodium: 143 mmol/L (ref 134–144)
Total Protein: 7.1 g/dL (ref 6.0–8.5)

## 2017-12-01 LAB — B12 AND FOLATE PANEL
Folate: >20 ng/mL (ref >3.0)
Vitamin B-12: 485 pg/mL (ref 232–1245)

## 2017-12-01 LAB — METHYLMALONIC ACID, SERUM: Methylmalonic Acid: 199 nmol/L (ref 0–378)

## 2017-12-01 LAB — RPR: RPR: NONREACTIVE

## 2017-12-01 LAB — TSH: TSH: 3.03 u[IU]/mL (ref 0.450–4.500)

## 2017-12-02 ENCOUNTER — Encounter (HOSPITAL_COMMUNITY): Payer: Self-pay | Admitting: *Deleted

## 2017-12-02 ENCOUNTER — Emergency Department (HOSPITAL_COMMUNITY)
Admission: EM | Admit: 2017-12-02 | Discharge: 2017-12-02 | Disposition: A | Discharge status: Home / Self Care | Payer: Medicare HMO | Attending: Emergency Medicine | Admitting: Emergency Medicine

## 2017-12-02 ENCOUNTER — Emergency Department (HOSPITAL_COMMUNITY): Payer: Medicare HMO

## 2017-12-02 ENCOUNTER — Other Ambulatory Visit: Payer: Self-pay

## 2017-12-02 DIAGNOSIS — R600 Localized edema: Secondary | ICD-10-CM | POA: Insufficient documentation

## 2017-12-02 DIAGNOSIS — R51 Headache: Secondary | ICD-10-CM | POA: Diagnosis not present

## 2017-12-02 DIAGNOSIS — I10 Essential (primary) hypertension: Secondary | ICD-10-CM | POA: Diagnosis not present

## 2017-12-02 DIAGNOSIS — Z87891 Personal history of nicotine dependence: Secondary | ICD-10-CM | POA: Insufficient documentation

## 2017-12-02 DIAGNOSIS — Z79899 Other long term (current) drug therapy: Secondary | ICD-10-CM | POA: Diagnosis not present

## 2017-12-02 DIAGNOSIS — F439 Reaction to severe stress, unspecified: Secondary | ICD-10-CM | POA: Insufficient documentation

## 2017-12-02 DIAGNOSIS — E039 Hypothyroidism, unspecified: Secondary | ICD-10-CM | POA: Diagnosis not present

## 2017-12-02 DIAGNOSIS — R05 Cough: Secondary | ICD-10-CM | POA: Diagnosis not present

## 2017-12-02 DIAGNOSIS — F419 Anxiety disorder, unspecified: Secondary | ICD-10-CM | POA: Diagnosis not present

## 2017-12-02 DIAGNOSIS — R69 Illness, unspecified: Secondary | ICD-10-CM | POA: Diagnosis not present

## 2017-12-02 DIAGNOSIS — N3000 Acute cystitis without hematuria: Secondary | ICD-10-CM | POA: Diagnosis not present

## 2017-12-02 DIAGNOSIS — R519 Headache, unspecified: Secondary | ICD-10-CM

## 2017-12-02 LAB — URINALYSIS, ROUTINE W REFLEX MICROSCOPIC
GLUCOSE, UA: NEGATIVE mg/dL
Hgb urine dipstick: NEGATIVE
KETONES UR: 5 mg/dL — AB
Nitrite: NEGATIVE
PH: 5 (ref 5.0–8.0)
Protein, ur: 30 mg/dL — AB
Specific Gravity, Urine: 1.029 (ref 1.005–1.030)

## 2017-12-02 LAB — COMPREHENSIVE METABOLIC PANEL
ALK PHOS: 49 U/L (ref 38–126)
ALT: 14 U/L (ref 14–54)
ANION GAP: 11 (ref 5–15)
AST: 22 U/L (ref 15–41)
Albumin: 4 g/dL (ref 3.5–5.0)
BUN: 22 mg/dL — ABNORMAL HIGH (ref 6–20)
CALCIUM: 9.2 mg/dL (ref 8.9–10.3)
CO2: 24 mmol/L (ref 22–32)
Chloride: 107 mmol/L (ref 101–111)
Creatinine, Ser: 0.95 mg/dL (ref 0.44–1.00)
GFR calc non Af Amer: 53 mL/min — ABNORMAL LOW (ref >60)
Glucose, Bld: 137 mg/dL — ABNORMAL HIGH (ref 65–99)
POTASSIUM: 3.8 mmol/L (ref 3.5–5.1)
SODIUM: 142 mmol/L (ref 135–145)
Total Bilirubin: 0.9 mg/dL (ref 0.3–1.2)
Total Protein: 6.6 g/dL (ref 6.5–8.1)

## 2017-12-02 LAB — CBC
HCT: 34.9 % — ABNORMAL LOW (ref 36.0–46.0)
HEMOGLOBIN: 11.1 g/dL — AB (ref 12.0–15.0)
MCH: 30.3 pg (ref 26.0–34.0)
MCHC: 31.8 g/dL (ref 30.0–36.0)
MCV: 95.4 fL (ref 78.0–100.0)
Platelets: 205 10*3/uL (ref 150–400)
RBC: 3.66 MIL/uL — AB (ref 3.87–5.11)
RDW: 13.9 % (ref 11.5–15.5)
WBC: 4.9 10*3/uL (ref 4.0–10.5)

## 2017-12-02 LAB — BRAIN NATRIURETIC PEPTIDE: B Natriuretic Peptide: 65.2 pg/mL (ref 0.0–100.0)

## 2017-12-02 LAB — LIPASE, BLOOD: LIPASE: 24 U/L (ref 11–51)

## 2017-12-02 LAB — I-STAT TROPONIN, ED: Troponin i, poc: 0 ng/mL (ref 0.00–0.08)

## 2017-12-02 MED ORDER — LORAZEPAM 0.5 MG PO TABS: 0.5000 mg | ORAL_TABLET | Freq: Three times a day (TID) | ORAL | 0 refills | Status: DC | PRN

## 2017-12-02 MED ORDER — LABETALOL HCL 5 MG/ML IV SOLN
5.0000 mg | Freq: Once | INTRAVENOUS | Status: AC
  Administered 2017-12-02: 5 mg via INTRAVENOUS
  Filled 2017-12-02: qty 4

## 2017-12-02 MED ORDER — LORAZEPAM 1 MG PO TABS
0.5000 mg | ORAL_TABLET | Freq: Once | ORAL | Status: AC
  Administered 2017-12-02: 0.5 mg via ORAL
  Filled 2017-12-02: qty 1

## 2017-12-02 MED ORDER — FOSFOMYCIN TROMETHAMINE 3 G PO PACK
3.0000 g | PACK | Freq: Once | ORAL | Status: AC
  Administered 2017-12-02: 3 g via ORAL
  Filled 2017-12-02: qty 3

## 2017-12-02 NOTE — ED Notes (Addendum)
Patient transported to X-ray 

## 2017-12-02 NOTE — Discharge Instructions (Signed)
Your workup today showed evidence of recurrent urinary tract infection.  We gave you a dose of antibiotics to treat this 1 time.  We also feel that her elevated blood pressure might be due to the stress of what you have been going through the last few days.  Please continue taking your blood pressure medicine at home however since you are out of your Ativan, we are giving a prescription for several doses to take if needed.  Please call to follow-up with your primary doctor in several days.  Your workup otherwise did not show evidence of serious infections, bleed in your head, or pneumonia.  If any symptoms change or worsen, please return to the nearest emergency department.
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
## 2017-12-02 NOTE — ED Notes (Signed)
Patient transported to CT 

## 2017-12-02 NOTE — ED Notes (Signed)
Patient given discharge instructions and verbalized understanding.  Patient stable to discharge at this time.  Patient is alert and oriented to baseline.  No distressed noted at this time.  All belongings taken with the patient at discharge.   

## 2017-12-02 NOTE — ED Triage Notes (Signed)
Pt reports being admitted at [facility] in Dec for HTN and anxiety. Reports checking bp at home and it has been > 902 systolic. Pt having severe headaches and mild leg swelling. No other complaints and no acute distress is noted at triage.

## 2017-12-04 LAB — URINE CULTURE

## 2017-12-06 DIAGNOSIS — I10 Essential (primary) hypertension: Secondary | ICD-10-CM | POA: Diagnosis not present

## 2017-12-06 DIAGNOSIS — R69 Illness, unspecified: Secondary | ICD-10-CM | POA: Diagnosis not present

## 2017-12-06 DIAGNOSIS — E039 Hypothyroidism, unspecified: Secondary | ICD-10-CM | POA: Diagnosis not present

## 2017-12-12 DIAGNOSIS — I10 Essential (primary) hypertension: Secondary | ICD-10-CM | POA: Diagnosis not present

## 2017-12-12 DIAGNOSIS — J069 Acute upper respiratory infection, unspecified: Secondary | ICD-10-CM | POA: Diagnosis not present

## 2017-12-13 DIAGNOSIS — I10 Essential (primary) hypertension: Secondary | ICD-10-CM | POA: Diagnosis not present

## 2017-12-21 DIAGNOSIS — Z1231 Encounter for screening mammogram for malignant neoplasm of breast: Secondary | ICD-10-CM | POA: Diagnosis not present

## 2017-12-24 DIAGNOSIS — I10 Essential (primary) hypertension: Secondary | ICD-10-CM | POA: Diagnosis not present

## 2017-12-24 DIAGNOSIS — R1030 Lower abdominal pain, unspecified: Secondary | ICD-10-CM | POA: Diagnosis not present

## 2017-12-31 DIAGNOSIS — R69 Illness, unspecified: Secondary | ICD-10-CM | POA: Diagnosis not present

## 2018-01-07 DIAGNOSIS — I10 Essential (primary) hypertension: Secondary | ICD-10-CM | POA: Diagnosis not present

## 2018-01-07 DIAGNOSIS — R1031 Right lower quadrant pain: Secondary | ICD-10-CM | POA: Diagnosis not present

## 2018-02-08 DIAGNOSIS — I10 Essential (primary) hypertension: Secondary | ICD-10-CM | POA: Diagnosis not present

## 2018-02-08 DIAGNOSIS — E039 Hypothyroidism, unspecified: Secondary | ICD-10-CM | POA: Diagnosis not present

## 2018-02-08 DIAGNOSIS — R32 Unspecified urinary incontinence: Secondary | ICD-10-CM | POA: Diagnosis not present

## 2018-02-08 DIAGNOSIS — H409 Unspecified glaucoma: Secondary | ICD-10-CM | POA: Diagnosis not present

## 2018-02-08 DIAGNOSIS — G4700 Insomnia, unspecified: Secondary | ICD-10-CM | POA: Diagnosis not present

## 2018-02-08 DIAGNOSIS — M810 Age-related osteoporosis without current pathological fracture: Secondary | ICD-10-CM | POA: Diagnosis not present

## 2018-02-08 DIAGNOSIS — E785 Hyperlipidemia, unspecified: Secondary | ICD-10-CM | POA: Diagnosis not present

## 2018-02-08 DIAGNOSIS — L602 Onychogryphosis: Secondary | ICD-10-CM | POA: Diagnosis not present

## 2018-02-08 DIAGNOSIS — Z6820 Body mass index (BMI) 20.0-20.9, adult: Secondary | ICD-10-CM | POA: Diagnosis not present

## 2018-02-08 DIAGNOSIS — E559 Vitamin D deficiency, unspecified: Secondary | ICD-10-CM | POA: Diagnosis not present

## 2018-02-18 DIAGNOSIS — H401112 Primary open-angle glaucoma, right eye, moderate stage: Secondary | ICD-10-CM | POA: Diagnosis not present

## 2018-02-18 DIAGNOSIS — H401123 Primary open-angle glaucoma, left eye, severe stage: Secondary | ICD-10-CM | POA: Diagnosis not present

## 2018-02-27 DIAGNOSIS — I10 Essential (primary) hypertension: Secondary | ICD-10-CM | POA: Diagnosis not present

## 2018-02-27 DIAGNOSIS — E039 Hypothyroidism, unspecified: Secondary | ICD-10-CM | POA: Diagnosis not present

## 2018-02-27 DIAGNOSIS — R413 Other amnesia: Secondary | ICD-10-CM | POA: Diagnosis not present

## 2018-02-27 DIAGNOSIS — E46 Unspecified protein-calorie malnutrition: Secondary | ICD-10-CM | POA: Diagnosis not present

## 2018-02-27 DIAGNOSIS — R634 Abnormal weight loss: Secondary | ICD-10-CM | POA: Diagnosis not present

## 2018-02-27 DIAGNOSIS — E7800 Pure hypercholesterolemia, unspecified: Secondary | ICD-10-CM | POA: Diagnosis not present

## 2018-02-27 DIAGNOSIS — R69 Illness, unspecified: Secondary | ICD-10-CM | POA: Diagnosis not present

## 2018-03-04 ENCOUNTER — Ambulatory Visit
Admission: RE | Admit: 2018-03-04 | Discharge: 2018-03-04 | Disposition: A | Discharge status: Home / Self Care | Payer: Medicare HMO | Source: Ambulatory Visit | Attending: Neurology | Admitting: Neurology

## 2018-03-04 ENCOUNTER — Other Ambulatory Visit: Payer: Self-pay

## 2018-03-04 ENCOUNTER — Encounter: Payer: Self-pay | Admitting: Neurology

## 2018-03-04 ENCOUNTER — Ambulatory Visit: Payer: Medicare HMO | Admitting: Neurology

## 2018-03-04 VITALS — BP 146/88 | HR 63 | Ht 65.0 in | Wt 118.0 lb

## 2018-03-04 DIAGNOSIS — R4689 Other symptoms and signs involving appearance and behavior: Secondary | ICD-10-CM

## 2018-03-04 DIAGNOSIS — R4189 Other symptoms and signs involving cognitive functions and awareness: Secondary | ICD-10-CM

## 2018-03-04 DIAGNOSIS — S0990XA Unspecified injury of head, initial encounter: Secondary | ICD-10-CM | POA: Diagnosis not present

## 2018-03-04 DIAGNOSIS — H9313 Tinnitus, bilateral: Secondary | ICD-10-CM

## 2018-03-04 NOTE — Patient Instructions (Signed)
1. Schedule head CT without contrast 2. Schedule Neurocognitive testing for formal memory testing with Dr. Si Raider 3. Discuss other anti-depressants with Dr. Inda Merlin 4. Agree with getting more help at home 5. Follow-up in 6 months, call for any changes
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
## 2018-03-05 ENCOUNTER — Telehealth: Payer: Self-pay

## 2018-03-05 NOTE — Telephone Encounter (Signed)
Spoke with pt relaying message below.   

## 2018-03-05 NOTE — Telephone Encounter (Signed)
-----   Message from Cameron Sprang, MD sent at 03/05/2018 10:30 AM EDT ----- Pls let her know the brain scan looks fine, no evidence of skull fracture or bleed. Thanks

## 2018-03-06 ENCOUNTER — Encounter: Payer: Self-pay | Admitting: Neurology

## 2018-04-05 ENCOUNTER — Emergency Department (HOSPITAL_COMMUNITY)
Admission: EM | Admit: 2018-04-05 | Discharge: 2018-04-06 | Disposition: A | Discharge status: Other Acute Inpatient Hospital | Payer: Medicare HMO | Attending: Emergency Medicine | Admitting: Emergency Medicine

## 2018-04-05 ENCOUNTER — Other Ambulatory Visit: Payer: Self-pay

## 2018-04-05 ENCOUNTER — Emergency Department (HOSPITAL_COMMUNITY): Payer: Medicare HMO

## 2018-04-05 ENCOUNTER — Encounter (HOSPITAL_COMMUNITY): Payer: Self-pay | Admitting: Emergency Medicine

## 2018-04-05 DIAGNOSIS — F32A Depression, unspecified: Secondary | ICD-10-CM

## 2018-04-05 DIAGNOSIS — Z79899 Other long term (current) drug therapy: Secondary | ICD-10-CM | POA: Insufficient documentation

## 2018-04-05 DIAGNOSIS — E7800 Pure hypercholesterolemia, unspecified: Secondary | ICD-10-CM | POA: Insufficient documentation

## 2018-04-05 DIAGNOSIS — I10 Essential (primary) hypertension: Secondary | ICD-10-CM | POA: Diagnosis not present

## 2018-04-05 DIAGNOSIS — R45851 Suicidal ideations: Secondary | ICD-10-CM | POA: Diagnosis not present

## 2018-04-05 DIAGNOSIS — F329 Major depressive disorder, single episode, unspecified: Secondary | ICD-10-CM | POA: Diagnosis not present

## 2018-04-05 DIAGNOSIS — R69 Illness, unspecified: Secondary | ICD-10-CM | POA: Diagnosis not present

## 2018-04-05 DIAGNOSIS — R9431 Abnormal electrocardiogram [ECG] [EKG]: Secondary | ICD-10-CM | POA: Diagnosis not present

## 2018-04-05 DIAGNOSIS — Z87891 Personal history of nicotine dependence: Secondary | ICD-10-CM | POA: Diagnosis not present

## 2018-04-05 DIAGNOSIS — E039 Hypothyroidism, unspecified: Secondary | ICD-10-CM | POA: Insufficient documentation

## 2018-04-05 DIAGNOSIS — I517 Cardiomegaly: Secondary | ICD-10-CM | POA: Diagnosis not present

## 2018-04-05 LAB — URINALYSIS, ROUTINE W REFLEX MICROSCOPIC
Bacteria, UA: NONE SEEN
Bilirubin Urine: NEGATIVE
Glucose, UA: NEGATIVE mg/dL
KETONES UR: NEGATIVE mg/dL
Nitrite: NEGATIVE
Protein, ur: NEGATIVE mg/dL
Specific Gravity, Urine: 1.017 (ref 1.005–1.030)
pH: 5 (ref 5.0–8.0)

## 2018-04-05 LAB — COMPREHENSIVE METABOLIC PANEL
ALBUMIN: 3.8 g/dL (ref 3.5–5.0)
ALT: 10 U/L — ABNORMAL LOW (ref 14–54)
ANION GAP: 8 (ref 5–15)
AST: 16 U/L (ref 15–41)
Alkaline Phosphatase: 48 U/L (ref 38–126)
BILIRUBIN TOTAL: 0.9 mg/dL (ref 0.3–1.2)
BUN: 20 mg/dL (ref 6–20)
CO2: 27 mmol/L (ref 22–32)
Calcium: 9.2 mg/dL (ref 8.9–10.3)
Chloride: 105 mmol/L (ref 101–111)
Creatinine, Ser: 0.82 mg/dL (ref 0.44–1.00)
GFR calc Af Amer: >60 mL/min (ref >60)
GFR calc non Af Amer: >60 mL/min (ref >60)
GLUCOSE: 107 mg/dL — AB (ref 65–99)
POTASSIUM: 3.6 mmol/L (ref 3.5–5.1)
SODIUM: 140 mmol/L (ref 135–145)
TOTAL PROTEIN: 6.4 g/dL — AB (ref 6.5–8.1)

## 2018-04-05 LAB — TSH: TSH: 2.369 u[IU]/mL (ref 0.350–4.500)

## 2018-04-05 LAB — ETHANOL: Alcohol, Ethyl (B): <10 mg/dL (ref <10)

## 2018-04-05 LAB — RAPID URINE DRUG SCREEN, HOSP PERFORMED
AMPHETAMINES: NOT DETECTED
BARBITURATES: NOT DETECTED
BENZODIAZEPINES: POSITIVE — AB
COCAINE: NOT DETECTED
Opiates: NOT DETECTED
TETRAHYDROCANNABINOL: NOT DETECTED

## 2018-04-05 LAB — CBC
HEMATOCRIT: 36.1 % (ref 36.0–46.0)
HEMOGLOBIN: 11.7 g/dL — AB (ref 12.0–15.0)
MCH: 31 pg (ref 26.0–34.0)
MCHC: 32.4 g/dL (ref 30.0–36.0)
MCV: 95.8 fL (ref 78.0–100.0)
Platelets: 203 10*3/uL (ref 150–400)
RBC: 3.77 MIL/uL — AB (ref 3.87–5.11)
RDW: 13.3 % (ref 11.5–15.5)
WBC: 5.1 10*3/uL (ref 4.0–10.5)

## 2018-04-05 LAB — ACETAMINOPHEN LEVEL

## 2018-04-05 LAB — SALICYLATE LEVEL: Salicylate Lvl: <7 mg/dL (ref 2.8–30.0)

## 2018-04-05 MED ORDER — NETARSUDIL DIMESYLATE 0.02 % OP SOLN: 1.0000 [drp] | Freq: Every day | OPHTHALMIC | Status: DC

## 2018-04-05 MED ORDER — ALUM & MAG HYDROXIDE-SIMETH 200-200-20 MG/5ML PO SUSP: 30.0000 mL | Freq: Four times a day (QID) | ORAL | Status: DC | PRN

## 2018-04-05 MED ORDER — LEVOTHYROXINE SODIUM 25 MCG PO TABS
25.0000 ug | ORAL_TABLET | Freq: Every day | ORAL | Status: DC
  Administered 2018-04-06: 25 ug via ORAL
  Filled 2018-04-05: qty 1

## 2018-04-05 MED ORDER — DORZOLAMIDE HCL-TIMOLOL MAL 2-0.5 % OP SOLN
1.0000 [drp] | Freq: Two times a day (BID) | OPHTHALMIC | Status: DC
  Administered 2018-04-05 – 2018-04-06 (×2): 1 [drp] via OPHTHALMIC
  Filled 2018-04-05: qty 10

## 2018-04-05 MED ORDER — ONDANSETRON HCL 4 MG PO TABS: 4.0000 mg | ORAL_TABLET | Freq: Three times a day (TID) | ORAL | Status: DC | PRN

## 2018-04-05 MED ORDER — FLUTICASONE PROPIONATE 50 MCG/ACT NA SUSP: 1.0000 | Freq: Every day | NASAL | Status: DC | PRN

## 2018-04-05 MED ORDER — LORAZEPAM 0.5 MG PO TABS
0.5000 mg | ORAL_TABLET | Freq: Three times a day (TID) | ORAL | Status: DC | PRN
  Administered 2018-04-05: 0.5 mg via ORAL
  Filled 2018-04-05: qty 1

## 2018-04-05 MED ORDER — ADULT MULTIVITAMIN W/MINERALS CH
1.0000 | ORAL_TABLET | Freq: Every day | ORAL | Status: DC
  Administered 2018-04-06: 1 via ORAL
  Filled 2018-04-05: qty 1

## 2018-04-05 MED ORDER — ACETAMINOPHEN 325 MG PO TABS: 650.0000 mg | ORAL_TABLET | ORAL | Status: DC | PRN

## 2018-04-05 MED ORDER — LOSARTAN POTASSIUM 50 MG PO TABS
100.0000 mg | ORAL_TABLET | Freq: Every morning | ORAL | Status: DC
  Administered 2018-04-06: 100 mg via ORAL
  Filled 2018-04-05: qty 2

## 2018-04-05 MED ORDER — SIMVASTATIN 5 MG PO TABS
5.0000 mg | ORAL_TABLET | Freq: Every morning | ORAL | Status: DC
  Administered 2018-04-06: 5 mg via ORAL
  Filled 2018-04-05: qty 1

## 2018-04-05 MED ORDER — LABETALOL HCL 200 MG PO TABS
100.0000 mg | ORAL_TABLET | Freq: Two times a day (BID) | ORAL | Status: DC
  Administered 2018-04-05 – 2018-04-06 (×3): 100 mg via ORAL
  Filled 2018-04-05 (×3): qty 1

## 2018-04-05 NOTE — ED Notes (Signed)
Patient's daughter called into check on patient; patient was able to talk directly to daughter; patient is a&ox -[facility]

## 2018-04-05 NOTE — ED Triage Notes (Addendum)
Patient complains of vivid dreams and depression for the last several weeks. Patient states she took medication for depression several years ago but has not recently. Patient reports suicidal ideas without a plan, denies homicidal ideations.  Patient denies having a plan to harm herself, but when asked if she had every tried to hurt herself she states "I took 8 pain pills yesterday morning but it didn't do any good". When asked for clarification on this statement, patient states she had hoped that she would go to sleep and not wake up.

## 2018-04-05 NOTE — ED Notes (Signed)
Patient transported to X-ray 

## 2018-04-05 NOTE — Progress Notes (Signed)
Patient was recommended geriatric inpatient treatment per [facility] NP on 5/31.  Referrals have been sent to the following geriatric inpatient treatment facilities: [facility]  CSW in disposition will continue to seek placement.  Verlon Setting, MSW, [place] Clinical social worker in disposition [facility], [place] Office [phone] and [phone] 04/05/2018 8:19 PM

## 2018-04-05 NOTE — ED Notes (Signed)
Pt belongings inventoried and placed in [employer] [address].

## 2018-04-05 NOTE — ED Notes (Signed)
Belongings locked in security (pink wallet).

## 2018-04-05 NOTE — Progress Notes (Signed)
Patient was declined at [facility], per Aysha, due to medical, specifically due to abdominal aneurysm.  Verlon Setting, MSW, [place] Clinical social worker in disposition [facility], [place] Office [phone] and [phone] 04/05/2018 9:46 PM

## 2018-04-05 NOTE — [facility] Counselor (Signed)
TTS writer called to assess the patient. Was informed patient was located in the hallway. TTS will receive a return call to assess the patient once she's placed in a room.

## 2018-04-05 NOTE — ED Notes (Signed)
Pt ate 100% of meal.

## 2018-04-05 NOTE — ED Notes (Signed)
TTS  Talking to patient at this time

## 2018-04-05 NOTE — ED Provider Notes (Signed)
[facility] EMERGENCY DEPARTMENT Provider Note   CSN: [id number] Arrival date & time: 04/05/18  1046     History   Chief Complaint Chief Complaint  Patient presents with  . Depression  . Suicidal    HPI Margaret Li is a 84 y.o. female.  Pt presents to the ED today with depression.  She said she has been depressed for several weeks.  She has been on antidepressants in the past, but has not been on them in years.  She lives alone and said she did not want to live any more.  The pt said she took 8 pills yesterday (?name) to make herself go to sleep and not wake up.  She was disappointed that "they did not work."  Pt said she just feels all alone.  She called her neighbor today and told her what happened.  Her neighbor brought her here.     Past Medical History:  Diagnosis Date  . AAA (abdominal aortic aneurysm) ([place])    infrarenal, 3.2 cm. seen on CTA Dec 2018  . Anxiety   . Aortic calcification (HCC)   . Black tarry stools    "per patient"  . Cholelithiasis   . Chronic low back pain   . Depression   . Diverticular disease 1999   h/o diverticular block   . GERD (gastroesophageal reflux disease)   . Glaucoma   . H/O hyperthyroidism   . Hypercholesterolemia   . Hypertension   . Hypothyroidism   . Migraine   . Osteoarthritis   . Osteoporosis   . Tubular adenoma    h/o  . Vitamin B 12 deficiency     Patient Active Problem List   Diagnosis Date Noted  . Vision loss, left eye 02/17/2017  . Visual hallucinations 02/17/2017  . Neuropathy 02/17/2017  . Acute blood loss anemia 06/14/2015  . Essential hypertension 06/14/2015  . Hypothyroidism 06/14/2015  . Glaucoma 06/14/2015  . Rectal bleeding 06/13/2015    Past Surgical History:  Procedure Laterality Date  . ABDOMINAL HYSTERECTOMY     80's  . BREAST ENHANCEMENT SURGERY    . CATARACT EXTRACTION, BILATERAL    . ESOPHAGOGASTRODUODENOSCOPY (EGD) WITH PROPOFOL N/A 08/04/2016   Procedure:  ESOPHAGOGASTRODUODENOSCOPY (EGD) WITH PROPOFOL;  Surgeon: Carol Ada, MD;  [facility];  Service: Endoscopy;  Laterality: N/A;  . HERNIA REPAIR Right    Inguinal hernia repair '12 -Dr. Rise Patience  . JOINT REPLACEMENT Right    -RTHA -4 '11 - Dr. [name]- [facility]     OB History   None      Home Medications    Prior to Admission medications   Medication Sig Start Date End Date Taking? Authorizing Provider  acetaminophen (TYLENOL) 500 MG tablet Take 500 mg by mouth every 6 (six) hours as needed for mild pain or headache.   Yes [provider]  calcium carbonate (OS-CAL) 600 MG tablet Take 600 mg by mouth daily.   Yes [provider]  Cholecalciferol (VITAMIN D-3) 1000 units CAPS Take 1,000 Units by mouth daily.    Yes [provider]  dorzolamide-timolol (COSOPT) 22.3-6.8 MG/ML ophthalmic solution Place 1 drop into both eyes 2 (two) times daily.   Yes [provider]  labetalol (NORMODYNE) 100 MG tablet Take 100 mg by mouth 2 (two) times daily.    Yes [provider]  levothyroxine (SYNTHROID, LEVOTHROID) 25 MCG tablet Take 25 mcg by mouth daily before breakfast.   Yes [provider]  LORazepam (  ATIVAN) 2 MG tablet Take 2 mg by mouth at bedtime.  04/27/15  Yes [provider]  losartan (COZAAR) 50 MG tablet Take 100 mg by mouth daily.  06/01/15  Yes [provider]  Netarsudil Dimesylate 0.02 % SOLN Place 1 drop into both eyes at bedtime. "Rhopressa" 09/07/17  Yes [provider]  simvastatin (ZOCOR) 10 MG tablet Take 5 mg by mouth every evening.  05/21/15  Yes [provider]  vitamin C (ASCORBIC ACID) 500 MG tablet Take 500 mg by mouth daily.   Yes [provider]  LORazepam (ATIVAN) 0.5 MG tablet Take 1 tablet (0.5 mg total) by mouth every 8 (eight) hours as needed for anxiety. Patient not taking: Reported on 04/05/2018 12/02/17   Tegeler, Gwenyth Allegra, MD    Family History Family  History  Problem Relation Age of Onset  . Breast cancer Sister        half sister  . Heart disease Brother        half brother    Social History Social History   Tobacco Use  . Smoking status: Former Smoker    Types: Cigarettes    Last attempt to quit: 08/01/2011    Years since quitting: 6.6  . Smokeless tobacco: Never Used  Substance Use Topics  . Alcohol use: No  . Drug use: No     Allergies   Biaxin [clarithromycin]; Boniva [ibandronic acid]; Codeine; Cymbalta [duloxetine hcl]; Hydrochlorothiazide; Lexapro [escitalopram oxalate]; Norvasc [amlodipine besylate]; Prozac [fluoxetine hcl]; Sulfa antibiotics; and Trazodone hcl   Review of Systems Review of Systems  Psychiatric/Behavioral: Positive for dysphoric mood and suicidal ideas.  All other systems reviewed and are negative.    Physical Exam Updated Vital Signs BP (!) 194/106 (BP Location: Right Arm)   Pulse 64   Temp 99.2 F (37.3 C) (Oral)   Resp 19   SpO2 99%   Physical Exam  Constitutional: She is oriented to person, place, and time. She appears well-developed and well-nourished.  HENT:  Head: Normocephalic and atraumatic.  Right Ear: External ear normal.  Left Ear: External ear normal.  Nose: Nose normal.  Mouth/Throat: Oropharynx is clear and moist.  Eyes: Pupils are equal, round, and reactive to light. Conjunctivae and EOM are normal.  Neck: Normal range of motion. Neck supple.  Cardiovascular: Normal rate, regular rhythm, normal heart sounds and intact distal pulses.  Pulmonary/Chest: Effort normal and breath sounds normal.  Abdominal: Soft.  Musculoskeletal: Normal range of motion.  Neurological: She is alert and oriented to person, place, and time.  Skin: Skin is warm. Capillary refill takes less than 2 seconds.  Psychiatric: She exhibits a depressed mood. She expresses suicidal ideation.  Nursing note and vitals reviewed.    ED Treatments / Results  Labs (all labs ordered are listed, but  only abnormal results are displayed) Labs Reviewed  COMPREHENSIVE METABOLIC PANEL - Abnormal; Notable for the following components:      Result Value   Glucose, Bld 107 (*)    Total Protein 6.4 (*)    ALT 10 (*)    All other components within normal limits  ACETAMINOPHEN LEVEL - Abnormal; Notable for the following components:   Acetaminophen (Tylenol), Serum <10 (*)    All other components within normal limits  CBC - Abnormal; Notable for the following components:   RBC 3.77 (*)    Hemoglobin 11.7 (*)    All other components within normal limits  RAPID URINE DRUG SCREEN, HOSP PERFORMED - Abnormal; Notable  for the following components:   Benzodiazepines POSITIVE (*)    All other components within normal limits  URINALYSIS, ROUTINE W REFLEX MICROSCOPIC - Abnormal; Notable for the following components:   Hgb urine dipstick SMALL (*)    Leukocytes, UA SMALL (*)    All other components within normal limits  ETHANOL  SALICYLATE LEVEL  TSH    EKG None  Radiology Dg Chest 2 View  Result Date: 04/05/2018 CLINICAL DATA:  84 year old female with depression symptoms. EXAM: CHEST - 2 VIEW COMPARISON:  12/02/2017 and earlier. FINDINGS: Stable cardiomegaly and tortuous thoracic aorta. Other mediastinal contours are within normal limits. Visualized tracheal air column is within normal limits. Stable lung volumes. No pneumothorax, pulmonary edema, pleural effusion or confluent pulmonary opacity. No acute osseous abnormality identified. Negative visible bowel gas pattern. IMPRESSION: No acute cardiopulmonary abnormality. Electronically Signed   By: Genevie Ann M.D.   On: 04/05/2018 12:20    Procedures Procedures (including critical care time)  Medications Ordered in ED Medications  acetaminophen (TYLENOL) tablet 650 mg (has no administration in time range)  ondansetron (ZOFRAN) tablet 4 mg (has no administration in time range)  alum & mag hydroxide-simeth (MAALOX/MYLANTA) 200-200-20 MG/5ML  suspension 30 mL (has no administration in time range)  dorzolamide-timolol (COSOPT) 22.3-6.8 MG/ML ophthalmic solution 1 drop (1 drop Both Eyes Given 04/05/18 2318)  labetalol (NORMODYNE) tablet 100 mg (100 mg Oral Given 04/05/18 2321)  LORazepam (ATIVAN) tablet 0.5 mg (0.5 mg Oral Given 04/05/18 2325)  losartan (COZAAR) tablet 100 mg (has no administration in time range)  multivitamin with minerals tablet 1 tablet (has no administration in time range)  Netarsudil Dimesylate 0.02 % SOLN 1 drop (1 drop Both Eyes Not Given 04/05/18 2329)  simvastatin (ZOCOR) tablet 5 mg (has no administration in time range)  levothyroxine (SYNTHROID, LEVOTHROID) tablet 25 mcg (has no administration in time range)     Initial Impression / Assessment and Plan / ED Course  I have reviewed the triage vital signs and the nursing notes.  Pertinent labs & imaging results that were available during my care of the patient were reviewed by me and considered in my medical decision making (see chart for details).   It is unclear which meds pt took, but it does not seem like it is anything that is dangerous.  The pt's vitals have been good while here.  TTS consult ordered.  Home meds and diet ordered.    Final Clinical Impressions(s) / ED Diagnoses   Final diagnoses:  Suicidal ideations  Depression, unspecified depression type    ED Discharge Orders    None       Isla Pence, MD 04/06/18 [phone]

## 2018-04-05 NOTE — ED Notes (Signed)
Patient's Margaret Li stopped by to see patient after visiting hours; Family was given pamphlet  With visiting hours and information; patient advised of family visit; Grandson's # is [phone]

## 2018-04-05 NOTE — Progress Notes (Signed)
Aysha from [place] requested patient's new BP and insurance information be resent. Pt's clinicals have been faxed.  CSW in disposition will continue to follow up with placement efforts.  Verlon Setting, MSW, [facility] [place] Clinical social worker in [place] [facility], [place] Office [phone] and [phone] 04/05/2018 9:23 PM
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
## 2018-04-05 NOTE — ED Notes (Signed)
Message to send to pharmacy to verify medications

## 2018-04-06 ENCOUNTER — Other Ambulatory Visit: Payer: Self-pay

## 2018-04-06 DIAGNOSIS — I10 Essential (primary) hypertension: Secondary | ICD-10-CM | POA: Diagnosis not present

## 2018-04-06 DIAGNOSIS — R69 Illness, unspecified: Secondary | ICD-10-CM | POA: Diagnosis not present

## 2018-04-06 DIAGNOSIS — F329 Major depressive disorder, single episode, unspecified: Secondary | ICD-10-CM | POA: Diagnosis not present

## 2018-04-06 DIAGNOSIS — H409 Unspecified glaucoma: Secondary | ICD-10-CM | POA: Diagnosis not present

## 2018-04-06 DIAGNOSIS — Z87891 Personal history of nicotine dependence: Secondary | ICD-10-CM | POA: Diagnosis not present

## 2018-04-06 DIAGNOSIS — M1990 Unspecified osteoarthritis, unspecified site: Secondary | ICD-10-CM | POA: Diagnosis not present

## 2018-04-06 DIAGNOSIS — Z882 Allergy status to sulfonamides status: Secondary | ICD-10-CM | POA: Diagnosis not present

## 2018-04-06 DIAGNOSIS — R45851 Suicidal ideations: Secondary | ICD-10-CM | POA: Diagnosis not present

## 2018-04-06 DIAGNOSIS — E7800 Pure hypercholesterolemia, unspecified: Secondary | ICD-10-CM | POA: Diagnosis not present

## 2018-04-06 DIAGNOSIS — G8929 Other chronic pain: Secondary | ICD-10-CM | POA: Diagnosis not present

## 2018-04-06 DIAGNOSIS — Z888 Allergy status to other drugs, medicaments and biological substances status: Secondary | ICD-10-CM | POA: Diagnosis not present

## 2018-04-06 NOTE — ED Notes (Signed)
Pt spoke w/her daughter on phone and then spoke w/grandson on phone - Margaret Li - [phone] - advised them she has been accepted to [facility].

## 2018-04-06 NOTE — ED Notes (Signed)
Pt aware has been accepted to [facility] - initially stated she wants to go home d/t denies SI/HI. States she lives alone and states "I promise I won't ever do that again. It didn't work". Pt voiced agreement w/tx plan - called her neighbor Gwinda Passe and advised her. She attempted to call her grandson - no answer.

## 2018-04-06 NOTE — Progress Notes (Signed)
Patient is accepted at [facility]. The admitting provider is Dr. Tillman Abide. The reporting number is [phone]. They would like for her to arrive after 12 PM through the emergency department. LCSW has contacted Archivist (patient's nurse) of this information.   Herbert Moors MSW, LCSW-A, LCAS-A Clinical Social Worker 04/06/2018 8:25 AM

## 2018-04-08 DIAGNOSIS — R69 Illness, unspecified: Secondary | ICD-10-CM | POA: Diagnosis not present

## 2018-04-08 DIAGNOSIS — I10 Essential (primary) hypertension: Secondary | ICD-10-CM | POA: Diagnosis not present

## 2018-04-08 DIAGNOSIS — H409 Unspecified glaucoma: Secondary | ICD-10-CM | POA: Diagnosis not present

## 2018-04-08 DIAGNOSIS — M1990 Unspecified osteoarthritis, unspecified site: Secondary | ICD-10-CM | POA: Diagnosis not present

## 2018-04-09 DIAGNOSIS — I10 Essential (primary) hypertension: Secondary | ICD-10-CM | POA: Diagnosis not present

## 2018-05-03 DIAGNOSIS — E46 Unspecified protein-calorie malnutrition: Secondary | ICD-10-CM | POA: Diagnosis not present

## 2018-05-03 DIAGNOSIS — I10 Essential (primary) hypertension: Secondary | ICD-10-CM | POA: Diagnosis not present

## 2018-05-03 DIAGNOSIS — R69 Illness, unspecified: Secondary | ICD-10-CM | POA: Diagnosis not present

## 2018-05-10 ENCOUNTER — Other Ambulatory Visit: Payer: Self-pay

## 2018-05-10 ENCOUNTER — Encounter (HOSPITAL_COMMUNITY): Payer: Self-pay

## 2018-05-10 ENCOUNTER — Emergency Department (HOSPITAL_COMMUNITY): Payer: Medicare HMO

## 2018-05-10 ENCOUNTER — Emergency Department (HOSPITAL_COMMUNITY)
Admission: EM | Admit: 2018-05-10 | Discharge: 2018-05-16 | Disposition: A | Discharge status: Other Acute Inpatient Hospital | Payer: Medicare HMO | Attending: Emergency Medicine | Admitting: Emergency Medicine

## 2018-05-10 DIAGNOSIS — Z87891 Personal history of nicotine dependence: Secondary | ICD-10-CM | POA: Diagnosis not present

## 2018-05-10 DIAGNOSIS — R4587 Impulsiveness: Secondary | ICD-10-CM | POA: Diagnosis not present

## 2018-05-10 DIAGNOSIS — R69 Illness, unspecified: Secondary | ICD-10-CM | POA: Diagnosis not present

## 2018-05-10 DIAGNOSIS — Z96641 Presence of right artificial hip joint: Secondary | ICD-10-CM | POA: Diagnosis not present

## 2018-05-10 DIAGNOSIS — Z79899 Other long term (current) drug therapy: Secondary | ICD-10-CM | POA: Diagnosis not present

## 2018-05-10 DIAGNOSIS — E039 Hypothyroidism, unspecified: Secondary | ICD-10-CM | POA: Diagnosis not present

## 2018-05-10 DIAGNOSIS — R410 Disorientation, unspecified: Secondary | ICD-10-CM | POA: Insufficient documentation

## 2018-05-10 DIAGNOSIS — F29 Unspecified psychosis not due to a substance or known physiological condition: Secondary | ICD-10-CM | POA: Diagnosis not present

## 2018-05-10 DIAGNOSIS — Z889 Allergy status to unspecified drugs, medicaments and biological substances status: Secondary | ICD-10-CM | POA: Diagnosis not present

## 2018-05-10 DIAGNOSIS — F323 Major depressive disorder, single episode, severe with psychotic features: Secondary | ICD-10-CM | POA: Diagnosis present

## 2018-05-10 DIAGNOSIS — F333 Major depressive disorder, recurrent, severe with psychotic symptoms: Secondary | ICD-10-CM | POA: Diagnosis not present

## 2018-05-10 DIAGNOSIS — R918 Other nonspecific abnormal finding of lung field: Secondary | ICD-10-CM | POA: Diagnosis not present

## 2018-05-10 DIAGNOSIS — R4182 Altered mental status, unspecified: Secondary | ICD-10-CM | POA: Diagnosis present

## 2018-05-10 DIAGNOSIS — F419 Anxiety disorder, unspecified: Secondary | ICD-10-CM | POA: Diagnosis not present

## 2018-05-10 DIAGNOSIS — I10 Essential (primary) hypertension: Secondary | ICD-10-CM | POA: Insufficient documentation

## 2018-05-10 LAB — URINALYSIS, ROUTINE W REFLEX MICROSCOPIC
BACTERIA UA: NONE SEEN
Bilirubin Urine: NEGATIVE
Glucose, UA: NEGATIVE mg/dL
Hgb urine dipstick: NEGATIVE
Ketones, ur: NEGATIVE mg/dL
Nitrite: NEGATIVE
PH: 5 (ref 5.0–8.0)
Protein, ur: NEGATIVE mg/dL
SPECIFIC GRAVITY, URINE: 1.024 (ref 1.005–1.030)

## 2018-05-10 LAB — RAPID URINE DRUG SCREEN, HOSP PERFORMED
Amphetamines: NOT DETECTED
BENZODIAZEPINES: NOT DETECTED
Cocaine: NOT DETECTED
OPIATES: NOT DETECTED
TETRAHYDROCANNABINOL: NOT DETECTED

## 2018-05-10 LAB — ETHANOL

## 2018-05-10 LAB — COMPREHENSIVE METABOLIC PANEL
ALT: 16 U/L (ref 0–44)
ANION GAP: 12 (ref 5–15)
AST: 31 U/L (ref 15–41)
Albumin: 4 g/dL (ref 3.5–5.0)
Alkaline Phosphatase: 45 U/L (ref 38–126)
BILIRUBIN TOTAL: 1.1 mg/dL (ref 0.3–1.2)
BUN: 35 mg/dL — ABNORMAL HIGH (ref 8–23)
CO2: 28 mmol/L (ref 22–32)
Calcium: 9.2 mg/dL (ref 8.9–10.3)
Chloride: 103 mmol/L (ref 98–111)
Creatinine, Ser: 0.95 mg/dL (ref 0.44–1.00)
GFR, EST NON AFRICAN AMERICAN: 52 mL/min — AB (ref >60)
Glucose, Bld: 112 mg/dL — ABNORMAL HIGH (ref 70–99)
POTASSIUM: 3.7 mmol/L (ref 3.5–5.1)
Sodium: 143 mmol/L (ref 135–145)
TOTAL PROTEIN: 6.7 g/dL (ref 6.5–8.1)

## 2018-05-10 LAB — CBG MONITORING, ED: GLUCOSE-CAPILLARY: 106 mg/dL — AB (ref 70–99)

## 2018-05-10 LAB — CBC
HCT: 36.5 % (ref 36.0–46.0)
HEMOGLOBIN: 12.1 g/dL (ref 12.0–15.0)
MCH: 31.8 pg (ref 26.0–34.0)
MCHC: 33.2 g/dL (ref 30.0–36.0)
MCV: 95.8 fL (ref 78.0–100.0)
Platelets: 200 10*3/uL (ref 150–400)
RBC: 3.81 MIL/uL — AB (ref 3.87–5.11)
RDW: 13.6 % (ref 11.5–15.5)
WBC: 4.9 10*3/uL (ref 4.0–10.5)

## 2018-05-10 MED ORDER — LEVOTHYROXINE SODIUM 25 MCG PO TABS
25.0000 ug | ORAL_TABLET | Freq: Every day | ORAL | Status: DC
  Administered 2018-05-11 – 2018-05-16 (×6): 25 ug via ORAL
  Filled 2018-05-10 (×6): qty 1

## 2018-05-10 MED ORDER — NETARSUDIL DIMESYLATE 0.02 % OP SOLN
1.0000 [drp] | Freq: Every day | OPHTHALMIC | Status: DC
  Administered 2018-05-14: 1 [drp] via OPHTHALMIC

## 2018-05-10 MED ORDER — CALCIUM CARBONATE 1250 (500 CA) MG PO TABS
1.0000 | ORAL_TABLET | Freq: Every day | ORAL | Status: DC
  Administered 2018-05-13 – 2018-05-16 (×4): 500 mg via ORAL
  Filled 2018-05-10 (×8): qty 1

## 2018-05-10 MED ORDER — CLONIDINE HCL 0.1 MG PO TABS
0.1000 mg | ORAL_TABLET | Freq: Three times a day (TID) | ORAL | Status: DC
  Administered 2018-05-10 – 2018-05-16 (×16): 0.1 mg via ORAL
  Filled 2018-05-10 (×17): qty 1

## 2018-05-10 MED ORDER — SIMVASTATIN 5 MG PO TABS
5.0000 mg | ORAL_TABLET | Freq: Every evening | ORAL | Status: DC
  Administered 2018-05-11 – 2018-05-15 (×3): 5 mg via ORAL
  Filled 2018-05-10 (×7): qty 1

## 2018-05-10 MED ORDER — LORAZEPAM 0.5 MG PO TABS
0.5000 mg | ORAL_TABLET | Freq: Three times a day (TID) | ORAL | Status: DC | PRN
  Administered 2018-05-10 – 2018-05-14 (×5): 0.5 mg via ORAL
  Filled 2018-05-10 (×5): qty 1

## 2018-05-10 MED ORDER — LOSARTAN POTASSIUM 50 MG PO TABS
100.0000 mg | ORAL_TABLET | Freq: Every day | ORAL | Status: DC
  Administered 2018-05-11 – 2018-05-16 (×6): 100 mg via ORAL
  Filled 2018-05-10 (×6): qty 2

## 2018-05-10 MED ORDER — LABETALOL HCL 100 MG PO TABS
100.0000 mg | ORAL_TABLET | Freq: Two times a day (BID) | ORAL | Status: DC
  Administered 2018-05-10 – 2018-05-16 (×11): 100 mg via ORAL
  Filled 2018-05-10 (×14): qty 1

## 2018-05-10 MED ORDER — VITAMIN C 500 MG PO TABS
500.0000 mg | ORAL_TABLET | Freq: Every day | ORAL | Status: DC
  Administered 2018-05-11 – 2018-05-16 (×5): 500 mg via ORAL
  Filled 2018-05-10 (×6): qty 1

## 2018-05-10 MED ORDER — VITAMIN D3 25 MCG (1000 UNIT) PO TABS
1000.0000 [IU] | ORAL_TABLET | Freq: Every day | ORAL | Status: DC
  Administered 2018-05-11 – 2018-05-16 (×5): 1000 [IU] via ORAL
  Filled 2018-05-10 (×6): qty 1

## 2018-05-10 MED ORDER — PAROXETINE HCL 10 MG PO TABS
10.0000 mg | ORAL_TABLET | Freq: Every day | ORAL | Status: DC
  Administered 2018-05-11: 10 mg via ORAL
  Filled 2018-05-10: qty 1
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
## 2018-05-10 NOTE — ED Notes (Signed)
Pt's belongings:  Placed in 1 white bag;  Kept in locker #31

## 2018-05-10 NOTE — Progress Notes (Signed)
CSW aware of consult. TTS consult currently pending. CSW will follow up after TTS recommendation is made.   Ollen Barges, [place] Work Department  [employer]  [phone]

## 2018-05-10 NOTE — ED Notes (Signed)
Spoke with pharmacist to verify overdue medications.

## 2018-05-10 NOTE — BH Assessment (Signed)
[facility] Assessment Progress Note  Case was staffed with Romilda Garret FNP who recommended patient be monitored and observed for safety. Patient will be seen in the a.m. by psychiatry.

## 2018-05-10 NOTE — ED Provider Notes (Signed)
[facility] DEPT Provider Note   CSN: [id number] Arrival date & time: 05/10/18  1024     History   Chief Complaint Chief Complaint  Patient presents with  . Altered Mental Status    HPI Margaret Li is a 84 y.o. female.  HPI Patient was brought in by EMS today.  Apparently neighbors called 911 because they found the patient outside her house naked.  They brought her back and side and helped her get dressed.  Patient was then wandering outside her home trying to get into other people's houses.  Patient previously had been living at [facility].  She was discharged home with an intermittent home health aide.  Patient herself denies any complaints.  She denies that this happened.  She states people are lying.  She remembers getting a phone call this morning telling her to wake up.  Patient states she felt a little bit dizzy and lightheaded.  She said she tried to call 911 earlier today and no one responded.  She heard clicks and noises on the line and thinks they just did not want to answer her.  Patient feels that someone trying to play a trick on her.  Patient states she really does not want to be here and wants to go home. Past Medical History:  Diagnosis Date  . AAA (abdominal aortic aneurysm) ([place])    infrarenal, 3.2 cm. seen on CTA Dec 2018  . Anxiety   . Aortic calcification (HCC)   . Black tarry stools    "per patient"  . Cholelithiasis   . Chronic low back pain   . Depression   . Diverticular disease 1999   h/o diverticular block   . GERD (gastroesophageal reflux disease)   . Glaucoma   . H/O hyperthyroidism   . Hypercholesterolemia   . Hypertension   . Hypothyroidism   . Migraine   . Osteoarthritis   . Osteoporosis   . Tubular adenoma    h/o  . Vitamin B 12 deficiency     Patient Active Problem List   Diagnosis Date Noted  . Vision loss, left eye 02/17/2017  . Visual hallucinations 02/17/2017  . Neuropathy 02/17/2017  . Acute  blood loss anemia 06/14/2015  . Essential hypertension 06/14/2015  . Hypothyroidism 06/14/2015  . Glaucoma 06/14/2015  . Rectal bleeding 06/13/2015    Past Surgical History:  Procedure Laterality Date  . ABDOMINAL HYSTERECTOMY     80's  . BREAST ENHANCEMENT SURGERY    . CATARACT EXTRACTION, BILATERAL    . ESOPHAGOGASTRODUODENOSCOPY (EGD) WITH PROPOFOL N/A 08/04/2016   Procedure: ESOPHAGOGASTRODUODENOSCOPY (EGD) WITH PROPOFOL;  Surgeon: Carol Ada, MD;  Location: [facility];  Service: Endoscopy;  Laterality: N/A;  . HERNIA REPAIR Right    Inguinal hernia repair '12 -Dr. Rise Patience  . JOINT REPLACEMENT Right    -RTHA -4 '11 - Dr. [name]- [facility]     OB History   None      Home Medications    Prior to Admission medications   Medication Sig Start Date End Date Taking? Authorizing Provider  acetaminophen (TYLENOL) 500 MG tablet Take 500 mg by mouth every 6 (six) hours as needed for mild pain or headache.    [provider]  calcium carbonate (OS-CAL) 600 MG tablet Take 600 mg by mouth daily.    [provider]  Cholecalciferol (VITAMIN D-3) 1000 units CAPS Take 1,000 Units by mouth daily.     [provider]  cloNIDine (CATAPRES) 0.1 MG  tablet Take 0.1 mg by mouth every 8 (eight) hours. 05/03/18   [provider]  dorzolamide-timolol (COSOPT) 22.3-6.8 MG/ML ophthalmic solution Place 1 drop into both eyes 2 (two) times daily.    [provider]  labetalol (NORMODYNE) 100 MG tablet Take 100 mg by mouth 2 (two) times daily.     [provider]  levothyroxine (SYNTHROID, LEVOTHROID) 25 MCG tablet Take 25 mcg by mouth daily before breakfast.    [provider]  LORazepam (ATIVAN) 0.5 MG tablet Take 1 tablet (0.5 mg total) by mouth every 8 (eight) hours as needed for anxiety. Patient not taking: Reported on 04/05/2018 12/02/17   Tegeler, Gwenyth Allegra, MD  LORazepam (ATIVAN) 2 MG tablet Take 2 mg by mouth at bedtime.  04/27/15    [provider]  losartan (COZAAR) 50 MG tablet Take 100 mg by mouth daily.  06/01/15   [provider]  Netarsudil Dimesylate 0.02 % SOLN Place 1 drop into both eyes at bedtime. "Rhopressa" 09/07/17   [provider]  PARoxetine (PAXIL) 10 MG tablet Take 10 mg by mouth daily. 05/03/18   [provider]  simvastatin (ZOCOR) 10 MG tablet Take 5 mg by mouth every evening.  05/21/15   [provider]  vitamin C (ASCORBIC ACID) 500 MG tablet Take 500 mg by mouth daily.    [provider]    Family History Family History  Problem Relation Age of Onset  . Breast cancer Sister        half sister  . Heart disease Brother        half brother    Social History Social History   Tobacco Use  . Smoking status: Former Smoker    Types: Cigarettes    Last attempt to quit: 08/01/2011    Years since quitting: 6.7  . Smokeless tobacco: Never Used  Substance Use Topics  . Alcohol use: No  . Drug use: No     Allergies   Biaxin [clarithromycin]; Boniva [ibandronic acid]; Codeine; Cymbalta [duloxetine hcl]; Hydrochlorothiazide; Lexapro [escitalopram oxalate]; Norvasc [amlodipine besylate]; Prozac [fluoxetine hcl]; Sulfa antibiotics; and Trazodone hcl   Review of Systems Review of Systems  Constitutional: Negative for fatigue.  Cardiovascular: Negative for chest pain.  Gastrointestinal: Negative for abdominal pain.  Neurological: Negative for headaches.  All other systems reviewed and are negative.    Physical Exam Updated Vital Signs BP (!) 198/122   Pulse 68   Temp (!) 97.4 F (36.3 C) (Oral)   Resp 11   SpO2 100%   Physical Exam  HENT:  Head: Normocephalic and atraumatic.  Right Ear: External ear normal.  Left Ear: External ear normal.  Eyes: Conjunctivae are normal. Right eye exhibits no discharge. Left eye exhibits no discharge. No scleral icterus.  Neck: Neck supple. No tracheal deviation present.  Cardiovascular: Normal  rate, regular rhythm and intact distal pulses.  Pulmonary/Chest: Effort normal and breath sounds normal. No stridor. No respiratory distress. She has no wheezes. She has no rales.  Abdominal: Soft. Bowel sounds are normal. She exhibits no distension. There is no tenderness. There is no rebound and no guarding.  Musculoskeletal: She exhibits no edema or tenderness.  Neurological: She is alert. She has normal strength. No cranial nerve deficit (no facial droop, extraocular movements intact, no slurred speech) or sensory deficit. She exhibits normal muscle tone. She displays no seizure activity. Coordination normal. GCS eye subscore is 4. GCS verbal subscore is 4. GCS motor subscore is 6.  Skin:  Skin is warm and dry. No rash noted. She is not diaphoretic.  Psychiatric: She has a normal mood and affect. Her affect is not labile. Thought content is delusional. She does not exhibit a depressed mood. She expresses no homicidal and no suicidal ideation.  Nursing note and vitals reviewed.    ED Treatments / Results  Labs (all labs ordered are listed, but only abnormal results are displayed) Labs Reviewed  COMPREHENSIVE METABOLIC PANEL - Abnormal; Notable for the following components:      Result Value   Glucose, Bld 112 (*)    BUN 35 (*)    GFR calc non Af Amer 52 (*)    All other components within normal limits  CBC - Abnormal; Notable for the following components:   RBC 3.81 (*)    All other components within normal limits  CBG MONITORING, ED - Abnormal; Notable for the following components:   Glucose-Capillary 106 (*)    All other components within normal limits  ETHANOL  RAPID URINE DRUG SCREEN, HOSP PERFORMED  URINALYSIS, ROUTINE W REFLEX MICROSCOPIC    EKG None  Radiology No results found.  Procedures Procedures (including critical care time)  Medications Ordered in ED Medications  calcium carbonate (OS-CAL) tablet 600 mg (has no administration in time range)  Vitamin D-3  CAPS 1,000 Units (has no administration in time range)  cloNIDine (CATAPRES) tablet 0.1 mg (has no administration in time range)  labetalol (NORMODYNE) tablet 100 mg (has no administration in time range)  levothyroxine (SYNTHROID, LEVOTHROID) tablet 25 mcg (has no administration in time range)  LORazepam (ATIVAN) tablet 0.5 mg (has no administration in time range)  losartan (COZAAR) tablet 100 mg (has no administration in time range)  Netarsudil Dimesylate 0.02 % SOLN 1 drop (has no administration in time range)  PARoxetine (PAXIL) tablet 10 mg (has no administration in time range)  simvastatin (ZOCOR) tablet 5 mg (has no administration in time range)  vitamin C (ASCORBIC ACID) tablet 500 mg (has no administration in time range)     Initial Impression / Assessment and Plan / ED Course  I have reviewed the triage vital signs and the nursing notes.  Pertinent labs & imaging results that were available during my care of the patient were reviewed by me and considered in my medical decision making (see chart for details).  Clinical Course as of May 10 1437  Fri May 10, 2018  1256 Pt is refusing her CT scan   [JK]  1436 Labs reviewed.  No clinically significant abnormalities.  Patient is noted to be hypertensive arrival.  She is supposed to be taking several blood pressure medications.  I wonder with her confusion as she is actually been compliant.  I have ordered her medications now we will continue to monitor her blood pressure closely.  CT scan was ordered earlier however the patient did refuse.  He has no focal deficits on exam I do not think we need to sedate her in order to do the CT scan at this time.   [JK]    Clinical Course User Index [JK] Dorie Rank, MD    Patient presented to the emergency room for abnormal behavior and confusion.  Patient's laboratory tests are unremarkable.  She is medically cleared with the exception of the need to monitor blood pressure while she is here.   Psychiatry has evaluated the patient and they plan on reassessing her in the morning  Final Clinical Impressions(s) / ED Diagnoses   Final diagnoses:  Confusion  Hypertension, unspecified type    ED Discharge Orders    None       Dorie Rank, MD 05/10/18 1438
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
## 2018-05-10 NOTE — ED Notes (Signed)
Pt's daughter [name] [phone]) called from [place] stating her mother can't be trusted to be discharged, she sts she used to have HPOA but pt canceled it. Daughter sts to call pt's pcp Darcus Austin since sh is aware of pt's extensive psych hx.

## 2018-05-10 NOTE — ED Notes (Signed)
Bed: WA06 Expected date:  Expected time:  Means of arrival:  Comments: AMS

## 2018-05-10 NOTE — ED Notes (Signed)
Bed: YD28 Expected date:  Expected time:  Means of arrival:  Comments: Rm 6

## 2018-05-10 NOTE — Progress Notes (Signed)
Consult request has been received.   Per 1st shift ED CSW, pt was to be seen by TTS.  CSW notes pt was seen by TTS and was given recommendation to be monitored for safety and to be assessed by psychiatry in the AM.  Please reconsult if future social work needs arise.  CSW signing off, as social work intervention is no longer needed.  [name]. Cavion Faiola, LCSW, LCAS, CSI Clinical Social Worker Ph: [phone]     '

## 2018-05-11 DIAGNOSIS — R410 Disorientation, unspecified: Secondary | ICD-10-CM | POA: Insufficient documentation

## 2018-05-11 DIAGNOSIS — R4587 Impulsiveness: Secondary | ICD-10-CM | POA: Diagnosis not present

## 2018-05-11 DIAGNOSIS — Z87891 Personal history of nicotine dependence: Secondary | ICD-10-CM | POA: Diagnosis not present

## 2018-05-11 DIAGNOSIS — R69 Illness, unspecified: Secondary | ICD-10-CM | POA: Diagnosis not present

## 2018-05-11 DIAGNOSIS — I10 Essential (primary) hypertension: Secondary | ICD-10-CM | POA: Diagnosis not present

## 2018-05-11 DIAGNOSIS — F419 Anxiety disorder, unspecified: Secondary | ICD-10-CM

## 2018-05-11 DIAGNOSIS — Z889 Allergy status to unspecified drugs, medicaments and biological substances status: Secondary | ICD-10-CM

## 2018-05-11 DIAGNOSIS — F323 Major depressive disorder, single episode, severe with psychotic features: Secondary | ICD-10-CM | POA: Diagnosis present

## 2018-05-11 MED ORDER — OLANZAPINE 5 MG PO TBDP
5.0000 mg | ORAL_TABLET | Freq: Every day | ORAL | Status: DC
  Administered 2018-05-12 – 2018-05-14 (×3): 5 mg via ORAL
  Filled 2018-05-11 (×4): qty 1

## 2018-05-11 MED ORDER — DORZOLAMIDE HCL-TIMOLOL MAL 2-0.5 % OP SOLN
1.0000 [drp] | Freq: Two times a day (BID) | OPHTHALMIC | Status: DC
  Administered 2018-05-11 – 2018-05-16 (×11): 1 [drp] via OPHTHALMIC
  Filled 2018-05-11: qty 10

## 2018-05-11 MED ORDER — BUSPIRONE HCL 10 MG PO TABS
5.0000 mg | ORAL_TABLET | Freq: Two times a day (BID) | ORAL | Status: DC
  Administered 2018-05-11 – 2018-05-16 (×10): 5 mg via ORAL
  Filled 2018-05-11 (×12): qty 1

## 2018-05-11 MED ORDER — CEPHALEXIN 500 MG PO CAPS
500.0000 mg | ORAL_CAPSULE | Freq: Two times a day (BID) | ORAL | Status: DC
  Administered 2018-05-11 – 2018-05-16 (×10): 500 mg via ORAL
  Filled 2018-05-11 (×12): qty 1

## 2018-05-11 NOTE — Consult Note (Addendum)
[place] [facility] Consult   Reason for Consult:  Bizarre behavior Referring Physician:  EDP Patient Identification: Margaret Li MRN:  [id number] Principal Diagnosis: Major depressive disorder with psychotic features [facility]) Diagnosis:   Patient Active Problem List   Diagnosis Date Noted  . Major depressive disorder with psychotic features ([place]) [F32.3] 05/11/2018  . Confusion [R41.0]   . Vision loss, left eye [H54.62] 02/17/2017  . Visual hallucinations [R44.1] 02/17/2017  . Neuropathy [G62.9] 02/17/2017  . Acute blood loss anemia [D62] 06/14/2015  . Hypertension [I10] 06/14/2015  . Hypothyroidism [E03.9] 06/14/2015  . Glaucoma [H40.9] 06/14/2015  . Rectal bleeding [K62.5] 06/13/2015    Total Time spent with patient: 45 minutes  Subjective:   Margaret Li is a 84 y.o. female patient admitted with bizarre behavior at her home.  HPI:  Pt was seen and chart reviewed with treatment team and Dr Darleene Cleaver.  Pt denies suicidal/homicidal ideation, denies auditory/visual hallucinations and does not appear to be responding to internal stimuli. Pt came to the [facility] form her home yesterday after her neighbors found her outside of her house naked. Pt stated all of the allegations are not true. Pt lives alone but stated she wants to go to an ALF because she knows she needs help because she is not able to fix her food and has not been eating right. Pt complains that she thinks there is a tracker on her phone and that someone has broken into her house and molested her. Pt had been at [facility] but left there to return to her home because she stated someone broke into her room and stole some of her things. Pt is A&O x 4 and was calm and cooperative during the assessment. Pt appears depressed, anxious, and paranoid with delusions that people are conspiring against her. Pt's UDS and BAL are negative. Pt stated she takes no medications for mental health because she is not crazy. Labs  reviewed. Pt has a UTI and was started on Keflex. Medication adjustments were made. Pt would benefit from an inpatient gero psychiatric admission for crisis stabilization and medication management.   Past Psychiatric History: As above  Risk to Self: Suicidal Ideation: No Suicidal Intent: No Is patient at risk for suicide?: yes Suicidal Plan?: No Access to Means: No What has been your use of drugs/alcohol within the last 12 months?: Denies How many times?: 1 Other Self Harm Risks: (Altered mental state) Triggers for Past Attempts: Unknown Intentional Self Injurious Behavior: None Risk to Others: Homicidal Ideation: No Thoughts of Harm to Others: No Current Homicidal Intent: No Current Homicidal Plan: No Access to Homicidal Means: No Identified Victim: NA History of harm to others?: No Assessment of Violence: None Noted Violent Behavior Description: NA Does patient have access to weapons?: No Criminal Charges Pending?: No Does patient have a court date: No Prior Inpatient Therapy: Prior Inpatient Therapy: Yes Prior Therapy Dates: 2019 Prior Therapy Facilty/Provider(s): [facility] Reason for Treatment: S/I Prior Outpatient Therapy: Prior Outpatient Therapy: No Does patient have an ACCT team?: No Does patient have Intensive In-House Services?  : No Does patient have [facility] services? : No Does patient have P4CC services?: No  Past Medical History:  Past Medical History:  Diagnosis Date  . AAA (abdominal aortic aneurysm) ([place])    infrarenal, 3.2 cm. seen on CTA Dec 2018  . Anxiety   . Aortic calcification ([facility])   . Black tarry stools    "per patient"  . Cholelithiasis   . Chronic  low back pain   . Depression   . Diverticular disease 1999   h/o diverticular block   . GERD (gastroesophageal reflux disease)   . Glaucoma   . H/O hyperthyroidism   . Hypercholesterolemia   . Hypertension   . Hypothyroidism   . Migraine   . Osteoarthritis   . Osteoporosis   . Tubular adenoma     h/o  . Vitamin B 12 deficiency     Past Surgical History:  Procedure Laterality Date  . ABDOMINAL HYSTERECTOMY     80's  . BREAST ENHANCEMENT SURGERY    . CATARACT EXTRACTION, BILATERAL    . ESOPHAGOGASTRODUODENOSCOPY (EGD) WITH PROPOFOL N/A 08/04/2016   Procedure: ESOPHAGOGASTRODUODENOSCOPY (EGD) WITH PROPOFOL;  Surgeon: Carol Ada, MD;  Location: [facility];  Service: Endoscopy;  Laterality: N/A;  . HERNIA REPAIR Right    Inguinal hernia repair '12 -Dr. Rise Patience  . JOINT REPLACEMENT Right    -RTHA -4 '11 - Dr. [name]- [facility]   Family History:  Family History  Problem Relation Age of Onset  . Breast cancer Sister        half sister  . Heart disease Brother        half brother   Family Psychiatric  History: Unknown Social History:  Social History   Substance and Sexual Activity  Alcohol Use No     Social History   Substance and Sexual Activity  Drug Use No    Social History   Socioeconomic History  . Marital status: Divorced    Spouse name: Not on file  . Number of children: 2  . Years of education: Not on file  . Highest education level: Not on file  Occupational History  . Not on file  Social Needs  . Financial resource strain: Not on file  . Food insecurity:    Worry: Not on file    Inability: Not on file  . Transportation needs:    Medical: Not on file    Non-medical: Not on file  Tobacco Use  . Smoking status: Former Smoker    Types: Cigarettes    Last attempt to quit: 08/01/2011    Years since quitting: 6.7  . Smokeless tobacco: Never Used  Substance and Sexual Activity  . Alcohol use: No  . Drug use: No  . Sexual activity: Not Currently  Lifestyle  . Physical activity:    Days per week: Not on file    Minutes per session: Not on file  . Stress: Not on file  Relationships  . Social connections:    Talks on phone: Not on file    Gets together: Not on file    Attends religious service: Not on file    Active member of club or  organization: Not on file    Attends meetings of clubs or organizations: Not on file    Relationship status: Not on file  Other Topics Concern  . Not on file  Social History Narrative   Lives at home alone    She just returned home from [place]   Right handed   Additional Social History:    Allergies:   Allergies  Allergen Reactions  . Biaxin [Clarithromycin]     GI side effects  . Boniva [Ibandronic Acid]     Groin pain  . Codeine Other (See Comments)    GI side effects  . Cymbalta [Duloxetine Hcl]     GI side effects  . Hydrochlorothiazide Other (See Comments)    Increased  urination   . Lexapro [Escitalopram Oxalate] Nausea Only and Other (See Comments)    dizziness  . Norvasc [Amlodipine Besylate] Other (See Comments)    Abdominal pain, bad dreams  . Prozac [Fluoxetine Hcl] Other (See Comments)    Abnormal weight loss, GI upset   . Sulfa Antibiotics Other (See Comments)    Unknown reaction   . Trazodone Hcl Swelling and Rash    Swollen tongue     Labs:  Results for orders placed or performed during the hospital encounter of 05/10/18 (from the past 48 hour(s))  CBG monitoring, ED     Status: Abnormal   Collection Time: 05/10/18 10:45 AM  Result Value Ref Range   Glucose-Capillary 106 (H) 70 - 99 mg/dL   Comment 1 Notify RN    Comment 2 Document in Chart   Comprehensive metabolic panel     Status: Abnormal   Collection Time: 05/10/18 11:05 AM  Result Value Ref Range   Sodium 143 135 - 145 mmol/L   Potassium 3.7 3.5 - 5.1 mmol/L   Chloride 103 98 - 111 mmol/L    Comment: Please note change in reference range.   CO2 28 22 - 32 mmol/L   Glucose, Bld 112 (H) 70 - 99 mg/dL    Comment: Please note change in reference range.   BUN 35 (H) 8 - 23 mg/dL    Comment: Please note change in reference range.   Creatinine, Ser 0.95 0.44 - 1.00 mg/dL   Calcium 9.2 8.9 - 10.3 mg/dL   Total Protein 6.7 6.5 - 8.1 g/dL   Albumin 4.0 3.5 - 5.0 g/dL   AST 31 15 - 41  U/L   ALT 16 0 - 44 U/L    Comment: Please note change in reference range.   Alkaline Phosphatase 45 38 - 126 U/L   Total Bilirubin 1.1 0.3 - 1.2 mg/dL   GFR calc non Af Amer 52 (L) >60 mL/min   GFR calc Af Amer >60 >60 mL/min    Comment: (NOTE) The eGFR has been calculated using the CKD EPI equation. This calculation has not been validated in all clinical situations. eGFR's persistently <60 mL/min signify possible Chronic Kidney Disease.    Anion gap 12 5 - 15    Comment: Performed at [facility], [place] [address], [place], [place] [address]  CBC     Status: Abnormal   Collection Time: 05/10/18 11:05 AM  Result Value Ref Range   WBC 4.9 4.0 - 10.5 K/uL   RBC 3.81 (L) 3.87 - 5.11 MIL/uL   Hemoglobin 12.1 12.0 - 15.0 g/dL   HCT 36.5 36.0 - 46.0 %   MCV 95.8 78.0 - 100.0 fL   MCH 31.8 26.0 - 34.0 pg   MCHC 33.2 30.0 - 36.0 g/dL   RDW 13.6 11.5 - 15.5 %   Platelets 200 150 - 400 K/uL    Comment: Performed at [facility], [place] [address], [place], [place] [address]  Ethanol     Status: None   Collection Time: 05/10/18 12:15 PM  Result Value Ref Range   Alcohol, Ethyl (B) <10 <10 mg/dL    Comment: (NOTE) Lowest detectable limit for serum alcohol is 10 mg/dL. For medical purposes only. Performed at [facility], [place] [address], [place], [place] [address]   Rapid urine drug screen (hospital performed)     Status: Abnormal   Collection Time: 05/10/18  3:14 PM  Result Value Ref  Range   Opiates NONE DETECTED NONE DETECTED   Cocaine NONE DETECTED NONE DETECTED   Benzodiazepines NONE DETECTED NONE DETECTED   Amphetamines NONE DETECTED NONE DETECTED   Tetrahydrocannabinol NONE DETECTED NONE DETECTED   Barbiturates (A) NONE DETECTED    Result not available. Reagent lot number recalled by manufacturer.    Comment: Performed at [facility], [place] [address], [place], [place] [address]  Urinalysis, Routine w  reflex microscopic     Status: Abnormal   Collection Time: 05/10/18  3:14 PM  Result Value Ref Range   Color, Urine YELLOW YELLOW   APPearance CLEAR CLEAR   Specific Gravity, Urine 1.024 1.005 - 1.030   pH 5.0 5.0 - 8.0   Glucose, UA NEGATIVE NEGATIVE mg/dL   Hgb urine dipstick NEGATIVE NEGATIVE   Bilirubin Urine NEGATIVE NEGATIVE   Ketones, ur NEGATIVE NEGATIVE mg/dL   Protein, ur NEGATIVE NEGATIVE mg/dL   Nitrite NEGATIVE NEGATIVE   Leukocytes, UA MODERATE (A) NEGATIVE   WBC, UA 6-10 0 - 5 WBC/hpf   Bacteria, UA NONE SEEN NONE SEEN   Squamous Epithelial / LPF 0-5 0 - 5   Mucus PRESENT     Comment: Performed at [facility], [place] [address], [place], [place] [address]    Current Facility-Administered Medications  Medication Dose Route Frequency Provider Last Rate Last Dose  . busPIRone (BUSPAR) tablet 5 mg  5 mg Oral BID Jaeveon Ashland, MD      . calcium carbonate (OS-CAL - dosed in mg of elemental calcium) tablet 500 mg of elemental calcium  1 tablet Oral Q breakfast Dorie Rank, MD      . cephALEXin [facility]) capsule 500 mg  500 mg Oral Q12H Ethelene Hal, NP   500 mg at 05/11/18 1109  . cholecalciferol (VITAMIN D) tablet 1,000 Units  1,000 Units Oral Daily Dorie Rank, MD   1,000 Units at 05/11/18 0946  . cloNIDine (CATAPRES) tablet 0.1 mg  0.1 mg Oral Q8H Dorie Rank, MD   0.1 mg at 05/11/18 4010  . labetalol (NORMODYNE) tablet 100 mg  100 mg Oral BID Dorie Rank, MD   100 mg at 05/11/18 0949  . levothyroxine (SYNTHROID, LEVOTHROID) tablet 25 mcg  25 mcg Oral QAC breakfast Dorie Rank, MD   25 mcg at 05/11/18 0636  . LORazepam (ATIVAN) tablet 0.5 mg  0.5 mg Oral Q8H PRN Dorie Rank, MD   0.5 mg at 05/10/18 1513  . losartan (COZAAR) tablet 100 mg  100 mg Oral Daily Dorie Rank, MD   100 mg at 05/11/18 0948  . Netarsudil Dimesylate 0.02 % SOLN 1 drop  1 drop Both Eyes QHS Dorie Rank, MD      . OLANZapine zydis (ZYPREXA) disintegrating tablet 5 mg  5 mg Oral QHS  Janaye Corp, MD      . simvastatin (ZOCOR) tablet 5 mg  5 mg Oral QPM Dorie Rank, MD      . vitamin C (ASCORBIC ACID) tablet 500 mg  500 mg Oral Daily Dorie Rank, MD   500 mg at 05/11/18 2725   Current Outpatient Medications  Medication Sig Dispense Refill  . acetaminophen (TYLENOL) 500 MG tablet Take 500 mg by mouth every 6 (six) hours as needed for mild pain or headache.    . calcium carbonate (OS-CAL) 600 MG tablet Take 600 mg by mouth daily.    . Cholecalciferol (VITAMIN D-3) 1000 units CAPS Take 1,000 Units by mouth daily.     Marland Kitchen  cloNIDine (CATAPRES) 0.1 MG tablet Take 0.1 mg by mouth every 8 (eight) hours.  1  . dorzolamide-timolol (COSOPT) 22.3-6.8 MG/ML ophthalmic solution Place 1 drop into both eyes 2 (two) times daily.    Marland Kitchen labetalol (NORMODYNE) 100 MG tablet Take 100 mg by mouth 2 (two) times daily.     Marland Kitchen levothyroxine (SYNTHROID, LEVOTHROID) 25 MCG tablet Take 25 mcg by mouth daily before breakfast.    . LORazepam (ATIVAN) 0.5 MG tablet Take 1 tablet (0.5 mg total) by mouth every 8 (eight) hours as needed for anxiety. (Patient not taking: Reported on 04/05/2018) 10 tablet 0  . LORazepam (ATIVAN) 2 MG tablet Take 2 mg by mouth at bedtime.     Marland Kitchen losartan (COZAAR) 50 MG tablet Take 100 mg by mouth daily.     . Netarsudil Dimesylate 0.02 % SOLN Place 1 drop into both eyes at bedtime. "Rhopressa"    . PARoxetine (PAXIL) 10 MG tablet Take 10 mg by mouth daily.  1  . simvastatin (ZOCOR) 10 MG tablet Take 5 mg by mouth every evening.     . vitamin C (ASCORBIC ACID) 500 MG tablet Take 500 mg by mouth daily.      Musculoskeletal: Strength & Muscle Tone: within normal limits Gait & Station: normal Patient leans: N/A  Psychiatric Specialty Exam: Physical Exam  Constitutional: She is oriented to person, place, and time. She appears well-developed.  HENT:  Head: Normocephalic.  Respiratory: Effort normal.  Musculoskeletal: Normal range of motion.  Neurological: She is alert and  oriented to person, place, and time.  Psychiatric: Her speech is normal. Her mood appears anxious. She is agitated. Thought content is paranoid and delusional. Cognition and memory are normal. She expresses impulsivity. She exhibits a depressed mood.    Review of Systems  Psychiatric/Behavioral: Positive for depression and hallucinations. Negative for memory loss, substance abuse and suicidal ideas. The patient is nervous/anxious. The patient does not have insomnia.   All other systems reviewed and are negative.   Blood pressure 133/82, pulse 72, temperature 98.2 F (36.8 C), temperature source Oral, resp. rate 18, SpO2 100 %.There is no height or weight on file to calculate BMI.  General Appearance: Casual  Eye Contact:  Good  Speech:  Clear and Coherent  Volume:  Normal  Mood:  Anxious and Depressed  Affect:  Congruent  Thought Process:  Coherent and Linear  Orientation:  Full (Time, Place, and Person)  Thought Content:  Delusions, Paranoid Ideation and Rumination  Suicidal Thoughts:  No  Homicidal Thoughts:  No  Memory:  Immediate;   Good Recent;   Good Remote;   Fair  Judgement:  Impaired  Insight:  Fair  Psychomotor Activity:  Normal  Concentration:  Concentration: Good and Attention Span: Good  Recall:  Good  Fund of Knowledge:  Good  Language:  Good  Akathisia:  No  Handed:  Right  AIMS (if indicated):     Assets:  Agricultural consultant Housing  ADL's:  Intact  Cognition:  WNL  Sleep:        Treatment Plan Summary: Daily contact with patient to assess and evaluate symptoms and progress in treatment and Medication management (see MAR )   Disposition: Recommend psychiatric Inpatient admission when medically cleared.  Ethelene Hal, NP 05/11/2018 2:07 PM  Patient seen face-to-face for psychiatric evaluation, chart reviewed and case discussed with the physician extender and developed treatment plan. Reviewed the information  documented and agree with the treatment plan. Lasha Echeverria  Darleene Cleaver, MD

## 2018-05-11 NOTE — Progress Notes (Signed)
Lars Mage would like to be called with update after MD assesses patient. His number is [phone]. Fara Olden, RN

## 2018-05-11 NOTE — ED Notes (Signed)
Pt trying to leave. RN and 3 NT to escort back to room and ly down in bed. Security came in afterward and are talking to her now. I had to remove medications from pt, she took them off desk? and would not give them back.

## 2018-05-12 NOTE — ED Notes (Signed)
Explained to patient her elevated BP and encourage patient to take her BP meds again but refused again.

## 2018-05-12 NOTE — Progress Notes (Signed)
This patient continues to meet inpatient criteria. CSW fax information to the following facilities:   [facility] [place], [place] Social Worker [phone]

## 2018-05-12 NOTE — Consult Note (Addendum)
[facility] Consult   Reason for Consult:  Bizarre behavior Referring Physician:  EDP Patient Identification: Margaret Li MRN:  [id number] Principal Diagnosis: Major depressive disorder with psychotic features [facility]) Diagnosis:   Patient Active Problem List   Diagnosis Date Noted  . Major depressive disorder with psychotic features ([place]) [F32.3] 05/11/2018  . Confusion [R41.0]   . Vision loss, left eye [H54.62] 02/17/2017  . Visual hallucinations [R44.1] 02/17/2017  . Neuropathy [G62.9] 02/17/2017  . Acute blood loss anemia [D62] 06/14/2015  . Hypertension [I10] 06/14/2015  . Hypothyroidism [E03.9] 06/14/2015  . Glaucoma [H40.9] 06/14/2015  . Rectal bleeding [K62.5] 06/13/2015    Total Time spent with patient: 45 minutes  Subjective:   Margaret Li is a 84 y.o. female patient admitted with bizarre behavior at her home.  HPI:  Pt was seen and chart reviewed with treatment team and Dr Darleene Cleaver.  Pt denies suicidal/homicidal ideation, denies auditory/visual hallucinations and does not appear to be responding to internal stimuli. Pt came to the [facility] form her home yesterday after her neighbors found her outside of her house naked. Pt stated all of the allegations are not true. Pt lives alone but stated she wants to go to an ALF because she knows she needs help because she is not able to fix her food and has not been eating right. Pt complains that she thinks there is a tracker on her phone and that someone has broken into her house and molested her. Pt had been at [facility] but left there to return to her home because she stated someone broke into her room and stole some of her things. Pt is A&O x 4 and was calm and cooperative during the assessment. Pt appears depressed, anxious, and paranoid with delusions that people are conspiring against her. Pt's UDS and BAL are negative. Pt stated she takes no medications for mental health because she is not crazy. Pt was  started on Buspar and Zyprexa for anxiety and mood sabilization. Labs reviewed. Pt has a UTI and was started on Keflex. Medication adjustments were made. Pt would benefit from an inpatient gero psychiatric admission for crisis stabilization and medication management.   Past Psychiatric History: As above  Risk to Self: Suicidal Ideation: No Suicidal Intent: No Is patient at risk for suicide?: yes Suicidal Plan?: No Access to Means: No What has been your use of drugs/alcohol within the last 12 months?: Denies How many times?: 1 Other Self Harm Risks: (Altered mental state) Triggers for Past Attempts: Unknown Intentional Self Injurious Behavior: None Risk to Others: Homicidal Ideation: No Thoughts of Harm to Others: No Current Homicidal Intent: No Current Homicidal Plan: No Access to Homicidal Means: No Identified Victim: NA History of harm to others?: No Assessment of Violence: None Noted Violent Behavior Description: NA Does patient have access to weapons?: No Criminal Charges Pending?: No Does patient have a court date: No Prior Inpatient Therapy: Prior Inpatient Therapy: Yes Prior Therapy Dates: 2019 Prior Therapy Facilty/Provider(s): [facility] Reason for Treatment: S/I Prior Outpatient Therapy: Prior Outpatient Therapy: No Does patient have an ACCT team?: No Does patient have Intensive In-House Services?  : No Does patient have [facility] services? : No Does patient have P4CC services?: No  Past Medical History:  Past Medical History:  Diagnosis Date  . AAA (abdominal aortic aneurysm) ([place])    infrarenal, 3.2 cm. seen on CTA Dec 2018  . Anxiety   . Aortic calcification ([facility])   . Black tarry stools    "  per patient"  . Cholelithiasis   . Chronic low back pain   . Depression   . Diverticular disease 1999   h/o diverticular block   . GERD (gastroesophageal reflux disease)   . Glaucoma   . H/O hyperthyroidism   . Hypercholesterolemia   . Hypertension   . Hypothyroidism    . Migraine   . Osteoarthritis   . Osteoporosis   . Tubular adenoma    h/o  . Vitamin B 12 deficiency     Past Surgical History:  Procedure Laterality Date  . ABDOMINAL HYSTERECTOMY     80's  . BREAST ENHANCEMENT SURGERY    . CATARACT EXTRACTION, BILATERAL    . ESOPHAGOGASTRODUODENOSCOPY (EGD) WITH PROPOFOL N/A 08/04/2016   Procedure: ESOPHAGOGASTRODUODENOSCOPY (EGD) WITH PROPOFOL;  Surgeon: Carol Ada, MD;  Location: [facility];  Service: Endoscopy;  Laterality: N/A;  . HERNIA REPAIR Right    Inguinal hernia repair '12 -Dr. Rise Patience  . JOINT REPLACEMENT Right    -RTHA -4 '11 - Dr. [name]   Family History:  Family History  Problem Relation Age of Onset  . Breast cancer Sister        half sister  . Heart disease Brother        half brother   Family Psychiatric  History: Unknown Social History:  Social History   Substance and Sexual Activity  Alcohol Use No     Social History   Substance and Sexual Activity  Drug Use No    Social History   Socioeconomic History  . Marital status: Divorced    Spouse name: Not on file  . Number of children: 2  . Years of education: Not on file  . Highest education level: Not on file  Occupational History  . Not on file  Social Needs  . Financial resource strain: Not on file  . Food insecurity:    Worry: Not on file    Inability: Not on file  . Transportation needs:    Medical: Not on file    Non-medical: Not on file  Tobacco Use  . Smoking status: Former Smoker    Types: Cigarettes    Last attempt to quit: 08/01/2011    Years since quitting: 6.7  . Smokeless tobacco: Never Used  Substance and Sexual Activity  . Alcohol use: No  . Drug use: No  . Sexual activity: Not Currently  Lifestyle  . Physical activity:    Days per week: Not on file    Minutes per session: Not on file  . Stress: Not on file  Relationships  . Social connections:    Talks on phone: Not on file    Gets together: Not on file     Attends religious service: Not on file    Active member of club or organization: Not on file    Attends meetings of clubs or organizations: Not on file    Relationship status: Not on file  Other Topics Concern  . Not on file  Social History Narrative   Lives at home alone    She just returned home from [place]   Right handed   Additional Social History:    Allergies:   Allergies  Allergen Reactions  . Biaxin [Clarithromycin]     GI side effects  . Boniva [Ibandronic Acid]     Groin pain  . Codeine Other (See Comments)    GI side effects  . Cymbalta [Duloxetine Hcl]     GI side effects  .  Hydrochlorothiazide Other (See Comments)    Increased urination   . Lexapro [Escitalopram Oxalate] Nausea Only and Other (See Comments)    dizziness  . Norvasc [Amlodipine Besylate] Other (See Comments)    Abdominal pain, bad dreams  . Prozac [Fluoxetine Hcl] Other (See Comments)    Abnormal weight loss, GI upset   . Sulfa Antibiotics Other (See Comments)    Unknown reaction   . Trazodone Hcl Swelling and Rash    Swollen tongue     Labs:  Results for orders placed or performed during the hospital encounter of 05/10/18 (from the past 48 hour(s))  Rapid urine drug screen (hospital performed)     Status: Abnormal   Collection Time: 05/10/18  3:14 PM  Result Value Ref Range   Opiates NONE DETECTED NONE DETECTED   Cocaine NONE DETECTED NONE DETECTED   Benzodiazepines NONE DETECTED NONE DETECTED   Amphetamines NONE DETECTED NONE DETECTED   Tetrahydrocannabinol NONE DETECTED NONE DETECTED   Barbiturates (A) NONE DETECTED    Result not available. Reagent lot number recalled by manufacturer.    Comment: Performed at [facility], [place] [address], [place], [place] [address]  Urinalysis, Routine w reflex microscopic     Status: Abnormal   Collection Time: 05/10/18  3:14 PM  Result Value Ref Range   Color, Urine YELLOW YELLOW   APPearance CLEAR CLEAR   Specific  Gravity, Urine 1.024 1.005 - 1.030   pH 5.0 5.0 - 8.0   Glucose, UA NEGATIVE NEGATIVE mg/dL   Hgb urine dipstick NEGATIVE NEGATIVE   Bilirubin Urine NEGATIVE NEGATIVE   Ketones, ur NEGATIVE NEGATIVE mg/dL   Protein, ur NEGATIVE NEGATIVE mg/dL   Nitrite NEGATIVE NEGATIVE   Leukocytes, UA MODERATE (A) NEGATIVE   WBC, UA 6-10 0 - 5 WBC/hpf   Bacteria, UA NONE SEEN NONE SEEN   Squamous Epithelial / LPF 0-5 0 - 5   Mucus PRESENT     Comment: Performed at [facility], [place] [address] [place], [place] [address]    Current Facility-Administered Medications  Medication Dose Route Frequency Provider Last Rate Last Dose  . busPIRone (BUSPAR) tablet 5 mg  5 mg Oral BID Corena Pilgrim, MD   5 mg at 05/12/18 0955  . calcium carbonate (OS-CAL - dosed in mg of elemental calcium) tablet 500 mg of elemental calcium  1 tablet Oral Q breakfast Dorie Rank, MD      . cephALEXin [facility]) capsule 500 mg  500 mg Oral Q12H Ethelene Hal, NP   500 mg at 05/11/18 1109  . cholecalciferol (VITAMIN D) tablet 1,000 Units  1,000 Units Oral Daily Dorie Rank, MD   1,000 Units at 05/11/18 0946  . cloNIDine (CATAPRES) tablet 0.1 mg  0.1 mg Oral Q8H Dorie Rank, MD   0.1 mg at 05/12/18 0631  . dorzolamide-timolol (COSOPT) 22.3-6.8 MG/ML ophthalmic solution 1 drop  1 drop Both Eyes BID Isla Pence, MD   1 drop at 05/12/18 1007  . labetalol (NORMODYNE) tablet 100 mg  100 mg Oral BID Dorie Rank, MD   100 mg at 05/12/18 0955  . levothyroxine (SYNTHROID, LEVOTHROID) tablet 25 mcg  25 mcg Oral QAC breakfast Dorie Rank, MD   25 mcg at 05/12/18 0955  . LORazepam (ATIVAN) tablet 0.5 mg  0.5 mg Oral Q8H PRN Dorie Rank, MD   0.5 mg at 05/11/18 2137  . losartan (COZAAR) tablet 100 mg  100 mg Oral Daily Dorie Rank, MD   100 mg  at 05/12/18 0955  . Netarsudil Dimesylate 0.02 % SOLN 1 drop  1 drop Both Eyes QHS Dorie Rank, MD      . OLANZapine zydis (ZYPREXA) disintegrating tablet 5 mg  5 mg Oral QHS  Zimir Kittleson, MD      . simvastatin (ZOCOR) tablet 5 mg  5 mg Oral QPM Dorie Rank, MD   5 mg at 05/11/18 1755  . vitamin C (ASCORBIC ACID) tablet 500 mg  500 mg Oral Daily Dorie Rank, MD   500 mg at 05/11/18 3810   Current Outpatient Medications  Medication Sig Dispense Refill  . acetaminophen (TYLENOL) 500 MG tablet Take 500 mg by mouth every 6 (six) hours as needed for mild pain or headache.    . calcium carbonate (OS-CAL) 600 MG tablet Take 600 mg by mouth daily.    . Cholecalciferol (VITAMIN D-3) 1000 units CAPS Take 1,000 Units by mouth daily.     . cloNIDine (CATAPRES) 0.1 MG tablet Take 0.1 mg by mouth every 8 (eight) hours.  1  . dorzolamide-timolol (COSOPT) 22.3-6.8 MG/ML ophthalmic solution Place 1 drop into both eyes 2 (two) times daily.    Marland Kitchen labetalol (NORMODYNE) 100 MG tablet Take 100 mg by mouth 2 (two) times daily.     Marland Kitchen levothyroxine (SYNTHROID, LEVOTHROID) 25 MCG tablet Take 25 mcg by mouth daily before breakfast.    . LORazepam (ATIVAN) 0.5 MG tablet Take 1 tablet (0.5 mg total) by mouth every 8 (eight) hours as needed for anxiety. (Patient not taking: Reported on 04/05/2018) 10 tablet 0  . LORazepam (ATIVAN) 2 MG tablet Take 2 mg by mouth at bedtime.     Marland Kitchen losartan (COZAAR) 50 MG tablet Take 100 mg by mouth daily.     . Netarsudil Dimesylate 0.02 % SOLN Place 1 drop into both eyes at bedtime. "Rhopressa"    . PARoxetine (PAXIL) 10 MG tablet Take 10 mg by mouth daily.  1  . simvastatin (ZOCOR) 10 MG tablet Take 5 mg by mouth every evening.     . vitamin C (ASCORBIC ACID) 500 MG tablet Take 500 mg by mouth daily.      Musculoskeletal: Strength & Muscle Tone: within normal limits Gait & Station: normal Patient leans: N/A  Psychiatric Specialty Exam: Physical Exam  Constitutional: She is oriented to person, place, and time. She appears well-developed.  HENT:  Head: Normocephalic.  Respiratory: Effort normal.  Musculoskeletal: Normal range of motion.  Neurological:  She is alert and oriented to person, place, and time.  Psychiatric: Her speech is normal. Her mood appears anxious. She is agitated. Thought content is paranoid and delusional. Cognition and memory are normal. She expresses impulsivity. She exhibits a depressed mood.    Review of Systems  Psychiatric/Behavioral: Positive for depression and hallucinations. Negative for memory loss, substance abuse and suicidal ideas. The patient is nervous/anxious. The patient does not have insomnia.   All other systems reviewed and are negative.   Blood pressure (!) 126/111, pulse 69, temperature 97.7 F (36.5 C), temperature source Oral, resp. rate 18, SpO2 100 %.There is no height or weight on file to calculate BMI.  General Appearance: Casual  Eye Contact:  Good  Speech:  Clear and Coherent  Volume:  Normal  Mood:  Anxious and Depressed  Affect:  Congruent  Thought Process:  Coherent and Linear  Orientation:  Full (Time, Place, and Person)  Thought Content:  Delusions, Paranoid Ideation and Rumination  Suicidal Thoughts:  No  Homicidal Thoughts:  No  Memory:  Immediate;   Good Recent;   Good Remote;   Fair  Judgement:  Impaired  Insight:  Fair  Psychomotor Activity:  Normal  Concentration:  Concentration: Good and Attention Span: Good  Recall:  Good  Fund of Knowledge:  Good  Language:  Good  Akathisia:  No  Handed:  Right  AIMS (if indicated):     Assets:  Agricultural consultant Housing  ADL's:  Intact  Cognition:  WNL  Sleep:       Treatment Plan Summary: Daily contact with patient to assess and evaluate symptoms and progress in treatment and Medication management  Crisis stabilization Chart and Labs reviewed Continue these medications: -Buspar 5 mg twice daily for anxiety -Ativan 0.5 mg every 8 hours as needed for anxiety -Zyprexa 5 mg at bedtime for mood stabilization -Keflex 500 mg twice daily for UTI -Normodyne 100 mg twice daily beta  blocker -Cozaar 100 mg daily for hypertension -Synthroid 25 mcg once daily for thyroid disease   Disposition: Recommend psychiatric Inpatient admission when medically cleared. TTS to seek placement in a [place], NP 05/12/2018 12:57 PM  Patient seen face-to-face for psychiatric evaluation, chart reviewed and case discussed with the physician extender and developed treatment plan. Reviewed the information documented and agree with the treatment plan. Corena Pilgrim, MD

## 2018-05-12 NOTE — Progress Notes (Addendum)
CSW received request from psychiatric team to inform pt's grandson Pleas Patricia at ph:  [phone] that pt is being referred out for possible [facility]ipsyche placement.  12:38 PM'  CSW attempted to call pt's grandson, was unable to leave a VM due to VM box being full.  2:36 PM Pt's grandson called back and was updated.  CSW will continue to follow for D/C needs.  [name]. Randee Huston, LCSW, LCAS, CSI Clinical Social Worker Ph: [phone]     \

## 2018-05-13 ENCOUNTER — Emergency Department (HOSPITAL_COMMUNITY): Payer: Medicare HMO

## 2018-05-13 DIAGNOSIS — R4587 Impulsiveness: Secondary | ICD-10-CM | POA: Diagnosis not present

## 2018-05-13 DIAGNOSIS — R410 Disorientation, unspecified: Secondary | ICD-10-CM | POA: Diagnosis not present

## 2018-05-13 DIAGNOSIS — Z87891 Personal history of nicotine dependence: Secondary | ICD-10-CM | POA: Diagnosis not present

## 2018-05-13 DIAGNOSIS — I10 Essential (primary) hypertension: Secondary | ICD-10-CM | POA: Diagnosis not present

## 2018-05-13 DIAGNOSIS — R918 Other nonspecific abnormal finding of lung field: Secondary | ICD-10-CM | POA: Diagnosis not present

## 2018-05-13 DIAGNOSIS — Z889 Allergy status to unspecified drugs, medicaments and biological substances status: Secondary | ICD-10-CM | POA: Diagnosis not present

## 2018-05-13 DIAGNOSIS — F419 Anxiety disorder, unspecified: Secondary | ICD-10-CM | POA: Diagnosis not present

## 2018-05-13 DIAGNOSIS — R69 Illness, unspecified: Secondary | ICD-10-CM | POA: Diagnosis not present

## 2018-05-13 NOTE — Consult Note (Addendum)
[facility] Consult   Reason for Consult:  Bizarre behavior Referring Physician:  EDP Patient Identification: Margaret Li MRN:  [id number] Principal Diagnosis: Major depressive disorder with psychotic features [facility]) Diagnosis:   Patient Active Problem List   Diagnosis Date Noted  . Major depressive disorder with psychotic features ([place]) [F32.3] 05/11/2018  . Confusion [R41.0]   . Vision loss, left eye [H54.62] 02/17/2017  . Visual hallucinations [R44.1] 02/17/2017  . Neuropathy [G62.9] 02/17/2017  . Acute blood loss anemia [D62] 06/14/2015  . Hypertension [I10] 06/14/2015  . Hypothyroidism [E03.9] 06/14/2015  . Glaucoma [H40.9] 06/14/2015  . Rectal bleeding [K62.5] 06/13/2015    Total Time spent with patient: 45 minutes  Subjective:   Margaret Li is a 84 y.o. female patient admitted with bizarre behavior at her home.  HPI:  Pt was seen and chart reviewed with treatment team and Dr Mariea Clonts.  Pt denies suicidal/homicidal ideation, denies auditory/visual hallucinations and does not appear to be responding to internal stimuli. Pt came to the [facility] form her home after her neighbors found her outside of her house naked. Pt stated all of the allegations are not true. Pt lives alone but stated she wants to go to an ALF because she knows she needs help because she is not able to fix her food and has not been eating right. Pt stated she was trying to find an ALF before she ended up in the hospital and she stated she could give [place] the names of places we can call to speed up the process. Pt complains that she thinks there is a tracker on her phone and that someone has broken into her house and molested her. Pt had been at [facility] but left there to return to her home because she stated someone broke into her room and stole some of her things. Pt is A&O x 4 and was calm and cooperative during the assessment. Pt appears depressed, anxious, and paranoid with delusions that  people are conspiring against her. She reports feeling like the food is purposely contaminated. Pt's UDS and BAL are negative. Pt stated she takes no medications for mental health because she is not crazy. Pt was started on Buspar and Zyprexa for anxiety and mood stabilization. Labs reviewed. Pt has a UTI and was started on Keflex. Medication adjustments were made. Pt would benefit from an inpatient geropsychiatric admission for crisis stabilization and medication management.   Past Psychiatric History: As above  Risk to Self: Suicidal Ideation: No Suicidal Intent: No Is patient at risk for suicide?: yes Suicidal Plan?: No Access to Means: No What has been your use of drugs/alcohol within the last 12 months?: Denies How many times?: 1 Other Self Harm Risks: (Altered mental state) Triggers for Past Attempts: Unknown Intentional Self Injurious Behavior: None Risk to Others: Homicidal Ideation: No Thoughts of Harm to Others: No Current Homicidal Intent: No Current Homicidal Plan: No Access to Homicidal Means: No Identified Victim: NA History of harm to others?: No Assessment of Violence: None Noted Violent Behavior Description: NA Does patient have access to weapons?: No Criminal Charges Pending?: No Does patient have a court date: No Prior Inpatient Therapy: Prior Inpatient Therapy: Yes Prior Therapy Dates: 2019 Prior Therapy Facilty/Provider(s): [facility] Reason for Treatment: S/I Prior Outpatient Therapy: Prior Outpatient Therapy: No Does patient have an ACCT team?: No Does patient have Intensive In-House Services?  : No Does patient have [facility] services? : No Does patient have P4CC services?: No  Past  Medical History:  Past Medical History:  Diagnosis Date  . AAA (abdominal aortic aneurysm) ([place])    infrarenal, 3.2 cm. seen on CTA Dec 2018  . Anxiety   . Aortic calcification ([facility])   . Black tarry stools    "per patient"  . Cholelithiasis   . Chronic low back pain   .  Depression   . Diverticular disease 1999   h/o diverticular block   . GERD (gastroesophageal reflux disease)   . Glaucoma   . H/O hyperthyroidism   . Hypercholesterolemia   . Hypertension   . Hypothyroidism   . Migraine   . Osteoarthritis   . Osteoporosis   . Tubular adenoma    h/o  . Vitamin B 12 deficiency     Past Surgical History:  Procedure Laterality Date  . ABDOMINAL HYSTERECTOMY     80's  . BREAST ENHANCEMENT SURGERY    . CATARACT EXTRACTION, BILATERAL    . ESOPHAGOGASTRODUODENOSCOPY (EGD) WITH PROPOFOL N/A 08/04/2016   Procedure: ESOPHAGOGASTRODUODENOSCOPY (EGD) WITH PROPOFOL;  Surgeon: Carol Ada, MD;  Location: [facility];  Service: Endoscopy;  Laterality: N/A;  . HERNIA REPAIR Right    Inguinal hernia repair '12 -Dr. Rise Patience  . JOINT REPLACEMENT Right    -RTHA -4 '11 - Dr. [name]- [facility]   Family History:  Family History  Problem Relation Age of Onset  . Breast cancer Sister        half sister  . Heart disease Brother        half brother   Family Psychiatric  History: Unknown Social History:  Social History   Substance and Sexual Activity  Alcohol Use No     Social History   Substance and Sexual Activity  Drug Use No    Social History   Socioeconomic History  . Marital status: Divorced    Spouse name: Not on file  . Number of children: 2  . Years of education: Not on file  . Highest education level: Not on file  Occupational History  . Not on file  Social Needs  . Financial resource strain: Not on file  . Food insecurity:    Worry: Not on file    Inability: Not on file  . Transportation needs:    Medical: Not on file    Non-medical: Not on file  Tobacco Use  . Smoking status: Former Smoker    Types: Cigarettes    Last attempt to quit: 08/01/2011    Years since quitting: 6.7  . Smokeless tobacco: Never Used  Substance and Sexual Activity  . Alcohol use: No  . Drug use: No  . Sexual activity: Not Currently  Lifestyle  .  Physical activity:    Days per week: Not on file    Minutes per session: Not on file  . Stress: Not on file  Relationships  . Social connections:    Talks on phone: Not on file    Gets together: Not on file    Attends religious service: Not on file    Active member of club or organization: Not on file    Attends meetings of clubs or organizations: Not on file    Relationship status: Not on file  Other Topics Concern  . Not on file  Social History Narrative   Lives at home alone    She just returned home from Assisted Living   Right handed   Additional Social History: N/A    Allergies:   Allergies  Allergen Reactions  .  Biaxin [Clarithromycin]     GI side effects  . Boniva [Ibandronic Acid]     Groin pain  . Codeine Other (See Comments)    GI side effects  . Cymbalta [Duloxetine Hcl]     GI side effects  . Hydrochlorothiazide Other (See Comments)    Increased urination   . Lexapro [Escitalopram Oxalate] Nausea Only and Other (See Comments)    dizziness  . Norvasc [Amlodipine Besylate] Other (See Comments)    Abdominal pain, bad dreams  . Prozac [Fluoxetine Hcl] Other (See Comments)    Abnormal weight loss, GI upset   . Sulfa Antibiotics Other (See Comments)    Unknown reaction   . Trazodone Hcl Swelling and Rash    Swollen tongue     Labs:  No results found for this or any previous visit (from the past 48 hour(s)).  Current Facility-Administered Medications  Medication Dose Route Frequency Provider Last Rate Last Dose  . busPIRone (BUSPAR) tablet 5 mg  5 mg Oral BID Darleene Cleaver, Mojeed, MD   5 mg at 05/13/18 1021  . calcium carbonate (OS-CAL - dosed in mg of elemental calcium) tablet 500 mg of elemental calcium  1 tablet Oral Q breakfast Dorie Rank, MD   500 mg of elemental calcium at 05/13/18 0803  . cephALEXin (KEFLEX) capsule 500 mg  500 mg Oral Q12H Ethelene Hal, NP   500 mg at 05/13/18 1020  . cholecalciferol (VITAMIN D) tablet 1,000 Units  1,000  Units Oral Daily Dorie Rank, MD   1,000 Units at 05/13/18 1020  . cloNIDine (CATAPRES) tablet 0.1 mg  0.1 mg Oral Q8H Dorie Rank, MD   0.1 mg at 05/13/18 7619  . dorzolamide-timolol (COSOPT) 22.3-6.8 MG/ML ophthalmic solution 1 drop  1 drop Both Eyes BID Isla Pence, MD   1 drop at 05/13/18 1021  . labetalol (NORMODYNE) tablet 100 mg  100 mg Oral BID Dorie Rank, MD   100 mg at 05/12/18 2040  . levothyroxine (SYNTHROID, LEVOTHROID) tablet 25 mcg  25 mcg Oral QAC breakfast Dorie Rank, MD   25 mcg at 05/13/18 0803  . LORazepam (ATIVAN) tablet 0.5 mg  0.5 mg Oral Q8H PRN Dorie Rank, MD   0.5 mg at 05/12/18 2038  . losartan (COZAAR) tablet 100 mg  100 mg Oral Daily Dorie Rank, MD   100 mg at 05/13/18 1020  . Netarsudil Dimesylate 0.02 % SOLN 1 drop  1 drop Both Eyes QHS Dorie Rank, MD      . OLANZapine zydis (ZYPREXA) disintegrating tablet 5 mg  5 mg Oral QHS Akintayo, Mojeed, MD   5 mg at 05/12/18 2039  . simvastatin (ZOCOR) tablet 5 mg  5 mg Oral QPM Dorie Rank, MD   5 mg at 05/11/18 1755  . vitamin C (ASCORBIC ACID) tablet 500 mg  500 mg Oral Daily Dorie Rank, MD   500 mg at 05/13/18 1020   Current Outpatient Medications  Medication Sig Dispense Refill  . acetaminophen (TYLENOL) 500 MG tablet Take 500 mg by mouth every 6 (six) hours as needed for mild pain or headache.    . calcium carbonate (OS-CAL) 600 MG tablet Take 600 mg by mouth daily.    . Cholecalciferol (VITAMIN D-3) 1000 units CAPS Take 1,000 Units by mouth daily.     . cloNIDine (CATAPRES) 0.1 MG tablet Take 0.1 mg by mouth every 8 (eight) hours.  1  . dorzolamide-timolol (COSOPT) 22.3-6.8 MG/ML ophthalmic solution Place 1 drop into both eyes 2 (  two) times daily.    Marland Kitchen labetalol (NORMODYNE) 100 MG tablet Take 100 mg by mouth 2 (two) times daily.     Marland Kitchen levothyroxine (SYNTHROID, LEVOTHROID) 25 MCG tablet Take 25 mcg by mouth daily before breakfast.    . LORazepam (ATIVAN) 0.5 MG tablet Take 1 tablet (0.5 mg total) by mouth every 8  (eight) hours as needed for anxiety. (Patient not taking: Reported on 04/05/2018) 10 tablet 0  . LORazepam (ATIVAN) 2 MG tablet Take 2 mg by mouth at bedtime.     Marland Kitchen losartan (COZAAR) 50 MG tablet Take 100 mg by mouth daily.     . Netarsudil Dimesylate 0.02 % SOLN Place 1 drop into both eyes at bedtime. "Rhopressa"    . PARoxetine (PAXIL) 10 MG tablet Take 10 mg by mouth daily.  1  . simvastatin (ZOCOR) 10 MG tablet Take 5 mg by mouth every evening.     . vitamin C (ASCORBIC ACID) 500 MG tablet Take 500 mg by mouth daily.      Musculoskeletal: Strength & Muscle Tone: within normal limits Gait & Station: normal Patient leans: N/A  Psychiatric Specialty Exam: Physical Exam  Nursing note and vitals reviewed. Constitutional: She is oriented to person, place, and time. She appears well-developed.  HENT:  Head: Normocephalic and atraumatic.  Neck: Normal range of motion.  Respiratory: Effort normal.  Musculoskeletal: Normal range of motion.  Neurological: She is alert and oriented to person, place, and time.  Psychiatric: Her speech is normal. Her mood appears anxious. She is agitated. Thought content is paranoid and delusional. Cognition and memory are normal. She expresses impulsivity. She exhibits a depressed mood.    Review of Systems  Psychiatric/Behavioral: Positive for depression and hallucinations. Negative for memory loss, substance abuse and suicidal ideas. The patient is nervous/anxious. The patient does not have insomnia.   All other systems reviewed and are negative.   Blood pressure (!) 161/99, pulse (!) 56, temperature (!) 97.5 F (36.4 C), temperature source Oral, resp. rate 16, height 5\' 5"  (1.651 m), weight 120 lb (54.4 kg), SpO2 100 %.Body mass index is 19.97 kg/m.  General Appearance: Casual  Eye Contact:  Good  Speech:  Clear and Coherent  Volume:  Normal  Mood:  Anxious and Depressed  Affect:  Congruent  Thought Process:  Coherent, Linear and Descriptions of  Associations: Intact  Orientation:  Full (Time, Place, and Person)  Thought Content:  Delusions, Paranoid Ideation and Rumination  Suicidal Thoughts:  No  Homicidal Thoughts:  No  Memory:  Immediate;   Good Recent;   Good Remote;   Fair  Judgement:  Impaired  Insight:  Fair  Psychomotor Activity:  Normal  Concentration:  Concentration: Good and Attention Span: Good  Recall:  Good  Fund of Knowledge:  Good  Language:  Good  Akathisia:  No  Handed:  Right  AIMS (if indicated):   N/A  Assets:  Agricultural consultant Housing  ADL's:  Intact  Cognition:  WNL  Sleep:   N/A    Treatment Plan Summary: Daily contact with patient to assess and evaluate symptoms and progress in treatment and Medication management  Crisis stabilization Chart and Labs reviewed Continue these medications: -Buspar 5 mg twice daily for anxiety -Ativan 0.5 mg every 8 hours as needed for anxiety -Zyprexa 5 mg at bedtime for mood stabilization -Keflex 500 mg twice daily for UTI -Normodyne 100 mg twice daily beta blocker -Cozaar 100 mg daily for hypertension -Synthroid 25 mcg once  daily for thyroid disease   Disposition: Recommend psychiatric Inpatient admission when medically cleared. TTS to seek placement in a [place], NP 05/13/2018 11:57 AM   Patient seen face-to-face for psychiatric evaluation, chart reviewed and case discussed with the physician extender and developed treatment plan. Reviewed the information documented and agree with the treatment plan.  Buford Dresser, DO 05/13/18 8:57 PM

## 2018-05-13 NOTE — BH Assessment (Signed)
[facility] Assessment Progress Note  Per Margaret Dresser, DO, this pt requires psychiatric hospitalization at this time.  Dr Margaret Li also finds that pt meets criteria for IVC, which she has initiated.  IVC documents have been faxed to [facility], and at [employer] confirms receipt.  He has since faxed Findings and Custody Order to this Probation officer.  At 14:15 I called [employer] and spoke to Margaret Li, who took demographic information, agreeing to dispatch law enforcement to fill out Return of Service.  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  The following facilities have been contacted to seek placement for this pt, with results as noted:  Beds available, information sent, decision pending:  [facility]   At capacity:  [facility] (however, they have received referral for possible future consideration).  Margaret Li, [place] Coordinator [phone]

## 2018-05-13 NOTE — Progress Notes (Signed)
Patient very concerned about her Glaucoma eye drops (Netarsudil dimesylate 0.02%). Called and talked to patient's daughter to try to get the [facility]n Shiver, to bring in her eye drops because the hospital does not carry these eye drops.

## 2018-05-14 DIAGNOSIS — F323 Major depressive disorder, single episode, severe with psychotic features: Secondary | ICD-10-CM

## 2018-05-14 DIAGNOSIS — R69 Illness, unspecified: Secondary | ICD-10-CM | POA: Diagnosis not present

## 2018-05-14 NOTE — Consult Note (Addendum)
[place] [facility] Consult   Reason for Consult:  Bizarre behavior Referring Physician:  EDP Patient Identification: Margaret Li MRN:  [id number] Principal Diagnosis: Major depressive disorder with psychotic features [facility]) Diagnosis:   Patient Active Problem List   Diagnosis Date Noted  . Major depressive disorder with psychotic features ([place]) [F32.3] 05/11/2018    Priority: High  . Confusion [R41.0]   . Vision loss, left eye [H54.62] 02/17/2017  . Visual hallucinations [R44.1] 02/17/2017  . Neuropathy [G62.9] 02/17/2017  . Acute blood loss anemia [D62] 06/14/2015  . Hypertension [I10] 06/14/2015  . Hypothyroidism [E03.9] 06/14/2015  . Glaucoma [H40.9] 06/14/2015  . Rectal bleeding [K62.5] 06/13/2015    Total Time spent with patient: 30 minutes  Subjective:   Margaret Li is a 84 y.o. female patient admitted with bizarre behavior at her home.  HPI:  On admission:  Pt was seen and chart reviewed with treatment team and Dr Mariea Clonts.  Pt denies suicidal/homicidal ideation, denies auditory/visual hallucinations and does not appear to be responding to internal stimuli. Pt came to the [facility] form her home after her neighbors found her outside of her house naked. Pt stated all of the allegations are not true. Pt lives alone but stated she wants to go to an ALF because she knows she needs help because she is not able to fix her food and has not been eating right. Pt stated she was trying to find an ALF before she ended up in the hospital and she stated she could give [place] the names of places we can call to speed up the process.   Pt continues to believe there is a tracker on her phone and having paranoia that people are tracking her.  Less agitated but remains anxious and delusional.  Medications continued.   Pt would benefit from an inpatient geropsychiatric admission for crisis stabilization and medication management.   Past Psychiatric History: As above  Risk to Self: Suicidal  Ideation: No Suicidal Intent: No Is patient at risk for suicide?: yes Suicidal Plan?: No Access to Means: No What has been your use of drugs/alcohol within the last 12 months?: Denies How many times?: 1 Other Self Harm Risks: (Altered mental state) Triggers for Past Attempts: Unknown Intentional Self Injurious Behavior: None Risk to Others: Homicidal Ideation: No Thoughts of Harm to Others: No Current Homicidal Intent: No Current Homicidal Plan: No Access to Homicidal Means: No Identified Victim: NA History of harm to others?: No Assessment of Violence: None Noted Violent Behavior Description: NA Does patient have access to weapons?: No Criminal Charges Pending?: No Does patient have a court date: No Prior Inpatient Therapy: Prior Inpatient Therapy: Yes Prior Therapy Dates: 2019 Prior Therapy Facilty/Provider(s): [facility] Reason for Treatment: S/I Prior Outpatient Therapy: Prior Outpatient Therapy: No Does patient have an ACCT team?: No Does patient have Intensive In-House Services?  : No Does patient have [facility] services? : No Does patient have P4CC services?: No  Past Medical History:  Past Medical History:  Diagnosis Date  . AAA (abdominal aortic aneurysm) ([place])    infrarenal, 3.2 cm. seen on CTA Dec 2018  . Anxiety   . Aortic calcification ([facility])   . Black tarry stools    "per patient"  . Cholelithiasis   . Chronic low back pain   . Depression   . Diverticular disease 1999   h/o diverticular block   . GERD (gastroesophageal reflux disease)   . Glaucoma   . H/O hyperthyroidism   . Hypercholesterolemia   .  Hypertension   . Hypothyroidism   . Migraine   . Osteoarthritis   . Osteoporosis   . Tubular adenoma    h/o  . Vitamin B 12 deficiency     Past Surgical History:  Procedure Laterality Date  . ABDOMINAL HYSTERECTOMY     80's  . BREAST ENHANCEMENT SURGERY    . CATARACT EXTRACTION, BILATERAL    . ESOPHAGOGASTRODUODENOSCOPY (EGD) WITH PROPOFOL N/A  08/04/2016   Procedure: ESOPHAGOGASTRODUODENOSCOPY (EGD) WITH PROPOFOL;  Surgeon: Carol Ada, MD;  Location: [facility];  Service: Endoscopy;  Laterality: N/A;  . HERNIA REPAIR Right    Inguinal hernia repair '12 -Dr. Rise Patience  . JOINT REPLACEMENT Right    -RTHA -4 '11 - Dr. [name]- [facility]   Family History:  Family History  Problem Relation Age of Onset  . Breast cancer Sister        half sister  . Heart disease Brother        half brother   Family Psychiatric  History: Unknown Social History:  Social History   Substance and Sexual Activity  Alcohol Use No     Social History   Substance and Sexual Activity  Drug Use No    Social History   Socioeconomic History  . Marital status: Divorced    Spouse name: Not on file  . Number of children: 2  . Years of education: Not on file  . Highest education level: Not on file  Occupational History  . Not on file  Social Needs  . Financial resource strain: Not on file  . Food insecurity:    Worry: Not on file    Inability: Not on file  . Transportation needs:    Medical: Not on file    Non-medical: Not on file  Tobacco Use  . Smoking status: Former Smoker    Types: Cigarettes    Last attempt to quit: 08/01/2011    Years since quitting: 6.7  . Smokeless tobacco: Never Used  Substance and Sexual Activity  . Alcohol use: No  . Drug use: No  . Sexual activity: Not Currently  Lifestyle  . Physical activity:    Days per week: Not on file    Minutes per session: Not on file  . Stress: Not on file  Relationships  . Social connections:    Talks on phone: Not on file    Gets together: Not on file    Attends religious service: Not on file    Active member of club or organization: Not on file    Attends meetings of clubs or organizations: Not on file    Relationship status: Not on file  Other Topics Concern  . Not on file  Social History Narrative   Lives at home alone    She just returned home from Assisted Living    Right handed   Additional Social History: N/A    Allergies:   Allergies  Allergen Reactions  . Biaxin [Clarithromycin]     GI side effects  . Boniva [Ibandronic Acid]     Groin pain  . Codeine Other (See Comments)    GI side effects  . Cymbalta [Duloxetine Hcl]     GI side effects  . Hydrochlorothiazide Other (See Comments)    Increased urination   . Lexapro [Escitalopram Oxalate] Nausea Only and Other (See Comments)    dizziness  . Norvasc [Amlodipine Besylate] Other (See Comments)    Abdominal pain, bad dreams  . Prozac [Fluoxetine Hcl] Other (See Comments)  Abnormal weight loss, GI upset   . Sulfa Antibiotics Other (See Comments)    Unknown reaction   . Trazodone Hcl Swelling and Rash    Swollen tongue     Labs:  No results found for this or any previous visit (from the past 48 hour(s)).  Current Facility-Administered Medications  Medication Dose Route Frequency Provider Last Rate Last Dose  . busPIRone (BUSPAR) tablet 5 mg  5 mg Oral BID Corena Pilgrim, MD   5 mg at 05/14/18 0942  . calcium carbonate (OS-CAL - dosed in mg of elemental calcium) tablet 500 mg of elemental calcium  1 tablet Oral Q breakfast Dorie Rank, MD   500 mg of elemental calcium at 05/14/18 0758  . cephALEXin (KEFLEX) capsule 500 mg  500 mg Oral Q12H Ethelene Hal, NP   500 mg at 05/14/18 0942  . cholecalciferol (VITAMIN D) tablet 1,000 Units  1,000 Units Oral Daily Dorie Rank, MD   1,000 Units at 05/14/18 [phone]  . cloNIDine (CATAPRES) tablet 0.1 mg  0.1 mg Oral Q8H Dorie Rank, MD   0.1 mg at 05/14/18 5027  . dorzolamide-timolol (COSOPT) 22.3-6.8 MG/ML ophthalmic solution 1 drop  1 drop Both Eyes BID Isla Pence, MD   1 drop at 05/14/18 0939  . labetalol (NORMODYNE) tablet 100 mg  100 mg Oral BID Dorie Rank, MD   100 mg at 05/14/18 7412  . levothyroxine (SYNTHROID, LEVOTHROID) tablet 25 mcg  25 mcg Oral QAC breakfast Dorie Rank, MD   25 mcg at 05/14/18 0758  . LORazepam (ATIVAN)  tablet 0.5 mg  0.5 mg Oral Q8H PRN Dorie Rank, MD   0.5 mg at 05/14/18 0946  . losartan (COZAAR) tablet 100 mg  100 mg Oral Daily Dorie Rank, MD   100 mg at 05/14/18 8786  . Netarsudil Dimesylate 0.02 % SOLN 1 drop  1 drop Both Eyes QHS Dorie Rank, MD      . OLANZapine zydis (ZYPREXA) disintegrating tablet 5 mg  5 mg Oral QHS Akintayo, Mojeed, MD   5 mg at 05/13/18 2316  . simvastatin (ZOCOR) tablet 5 mg  5 mg Oral QPM Dorie Rank, MD   5 mg at 05/11/18 1755  . vitamin C (ASCORBIC ACID) tablet 500 mg  500 mg Oral Daily Dorie Rank, MD   500 mg at 05/14/18 7672   Current Outpatient Medications  Medication Sig Dispense Refill  . acetaminophen (TYLENOL) 500 MG tablet Take 500 mg by mouth every 6 (six) hours as needed for mild pain or headache.    . calcium carbonate (OS-CAL) 600 MG tablet Take 600 mg by mouth daily.    . Cholecalciferol (VITAMIN D-3) 1000 units CAPS Take 1,000 Units by mouth daily.     . cloNIDine (CATAPRES) 0.1 MG tablet Take 0.1 mg by mouth every 8 (eight) hours.  1  . dorzolamide-timolol (COSOPT) 22.3-6.8 MG/ML ophthalmic solution Place 1 drop into both eyes 2 (two) times daily.    Marland Kitchen labetalol (NORMODYNE) 100 MG tablet Take 100 mg by mouth 2 (two) times daily.     Marland Kitchen levothyroxine (SYNTHROID, LEVOTHROID) 25 MCG tablet Take 25 mcg by mouth daily before breakfast.    . LORazepam (ATIVAN) 0.5 MG tablet Take 1 tablet (0.5 mg total) by mouth every 8 (eight) hours as needed for anxiety. (Patient not taking: Reported on 04/05/2018) 10 tablet 0  . LORazepam (ATIVAN) 2 MG tablet Take 2 mg by mouth at bedtime.     Marland Kitchen losartan (COZAAR) 50 MG  tablet Take 100 mg by mouth daily.     . Netarsudil Dimesylate 0.02 % SOLN Place 1 drop into both eyes at bedtime. "Rhopressa"    . PARoxetine (PAXIL) 10 MG tablet Take 10 mg by mouth daily.  1  . simvastatin (ZOCOR) 10 MG tablet Take 5 mg by mouth every evening.     . vitamin C (ASCORBIC ACID) 500 MG tablet Take 500 mg by mouth daily.       Musculoskeletal: Strength & Muscle Tone: within normal limits Gait & Station: normal Patient leans: N/A  Psychiatric Specialty Exam: Physical Exam  Nursing note and vitals reviewed. Constitutional: She is oriented to person, place, and time. She appears well-developed and well-nourished.  HENT:  Head: Normocephalic and atraumatic.  Neck: Normal range of motion.  Cardiovascular: Normal rate.  Respiratory: Effort normal.  Musculoskeletal: Normal range of motion.  Neurological: She is alert and oriented to person, place, and time.  Psychiatric: Her speech is normal and behavior is normal. Her mood appears anxious. Thought content is paranoid and delusional. Cognition and memory are normal. She expresses impulsivity. She exhibits a depressed mood.    Review of Systems  Psychiatric/Behavioral: Positive for depression and hallucinations. Negative for memory loss, substance abuse and suicidal ideas. The patient is nervous/anxious. The patient does not have insomnia.   All other systems reviewed and are negative.   Blood pressure (!) 182/92, pulse (!) 56, temperature (!) 97.2 F (36.2 C), temperature source Axillary, resp. rate 16, height 5\' 5"  (1.651 m), weight 54.4 kg (120 lb), SpO2 98 %.Body mass index is 19.97 kg/m.  General Appearance: Casual  Eye Contact:  Good  Speech:  Clear and Coherent  Volume:  Normal  Mood:  Anxious and Depressed  Affect:  Congruent  Thought Process:  Coherent, Linear and Descriptions of Associations: Intact  Orientation:  Full (Time, Place, and Person)  Thought Content:  Delusions, Paranoid Ideation and Rumination  Suicidal Thoughts:  No  Homicidal Thoughts:  No  Memory:  Immediate;   Good Recent;   Good Remote;   Fair  Judgement:  Impaired  Insight:  Fair  Psychomotor Activity:  Normal  Concentration:  Concentration: Good and Attention Span: Good  Recall:  Good  Fund of Knowledge:  Good  Language:  Good  Akathisia:  No  Handed:  Right  AIMS  (if indicated):   N/A  Assets:  Agricultural consultant Housing  ADL's:  Intact  Cognition:  WNL  Sleep:   N/A    Treatment Plan Summary: Daily contact with patient to assess and evaluate symptoms and progress in treatment and Medication management  Crisis stabilization Chart and Labs reviewed Continue these medications: -Buspar 5 mg twice daily for anxiety -Ativan 0.5 mg every 8 hours as needed for anxiety -Zyprexa 5 mg at bedtime for mood stabilization -Keflex 500 mg twice daily for UTI -Normodyne 100 mg twice daily beta blocker -Cozaar 100 mg daily for hypertension -Synthroid 25 mcg once daily for thyroid disease   Disposition: Recommend psychiatric Inpatient admission when medically cleared. TTS to seek placement in a [facility]  Waylan Boga, NP 05/14/2018 1:22 PM   Patient seen face-to-face for psychiatric evaluation, chart reviewed and case discussed with the physician extender and developed treatment plan. Reviewed the information documented and agree with the treatment plan.  Buford Dresser, DO 05/15/18 1:58 AM

## 2018-05-14 NOTE — Progress Notes (Signed)
Melissa from [facility] will be reviewing pt and asks that disposition call on 05/15/18 to follow up with [facility], as [facility] will have probable D/C's and thus, availablwe beds on 7/10.  Please reconsult if future social work needs arise.  CSW signing off, as social work intervention is no longer needed.  Margaret Li. Margaret Hartje, LCSW, LCAS, CSI Clinical Social Worker Ph: [phone]      '

## 2018-05-14 NOTE — BH Assessment (Addendum)
[facility] Assessment Progress Note  Per Buford Dresser, DO, this pt requires psychiatric hospitalization at this time.  The following facilities have been contacted to seek placement for this pt, with results as noted:  Beds available, information sent, decision pending:  [facility] (sent requested updated information) [place]:  [facility] (no female beds) [facility] [place], [place] Coordinator [phone]

## 2018-05-14 NOTE — ED Notes (Signed)
[name] (daughter) -  [phone]

## 2018-05-14 NOTE — ED Notes (Addendum)
Patient's grandson, Lenard Forth (at [phone]), came to get patient's keys at 2330 so that he could go to her home and get her eyedrops for glaucoma so she could take them. He went to visit her to let her know what he was doing. At first, she tried to leave so she could go home with him but then he explained to her and she calmed down and went back to her room. She went to sleep while he was gone to get the eye drops. I decided not to wake patient up to give eye drops because he didn't get back until 0045. He kept her home keys just in case he needed to bring anything else from her home to the hospital. I checked one bottle into the pharmacy refrigerator [(Rhopressa) netarsudil ophthalmic solution) 0.02%]. I placed the other one in her drawer with expiration date on it.  See sheet placed in chart.

## 2018-05-14 NOTE — Progress Notes (Signed)
Barabara from [facility] called and sted provider there believes pt can go straight to an ALF and cannot accept pt at this time.  Please reconsult if future social work needs arise.  CSW signing off, as social work intervention is no longer needed.  [name]. Eloni Darius, LCSW, LCAS, CSI Clinical Social Worker Ph: [phone]

## 2018-05-15 MED ORDER — DOCUSATE SODIUM 100 MG PO CAPS
100.0000 mg | ORAL_CAPSULE | Freq: Two times a day (BID) | ORAL | Status: DC | PRN
  Administered 2018-05-15: 100 mg via ORAL
  Filled 2018-05-15: qty 1

## 2018-05-15 MED ORDER — OLANZAPINE 5 MG PO TBDP
5.0000 mg | ORAL_TABLET | Freq: Two times a day (BID) | ORAL | Status: DC
  Administered 2018-05-15 – 2018-05-16 (×2): 5 mg via ORAL
  Filled 2018-05-15 (×2): qty 1

## 2018-05-15 NOTE — Progress Notes (Addendum)
CSW received a call from pt's RN stating pt's son Margaret Li was bedside and was requesting updates on his grandmother's disposition.  CSW updated the grandson that the pt was being referred out to psychiatric facilities.  Pt's grandson asked that he and the pt's daughter Margaret Li be allowed to call for updates.  CSW asked pt is she would give verbal consent and pt stated this was fime.  Pt chose a security word, "[place]" and gave verbal permission for her grandson Margaret Li and her daughter Margaret Li to be updated by social work or nursing when they call.  CSW provided pt's granson with the CSW's phone number and stated he would be available after 3pm M-Th and 5pm on Friday.  Pt and pt's grandson voiced understanding, thanked the CSW and pt's grandson shook the CSW's hand.  RN updated for future RN handoffs.  Please reconsult if future social work needs arise.  CSW signing off, as social work intervention is no longer needed.  [name]. [name], LCSW, LCAS, CSI Clinical Social Worker Ph: [phone]

## 2018-05-15 NOTE — ED Notes (Signed)
Pt is very paranoid and unwilling to let RN give her water to take her pills with. Pt states "The water is not clean."  Pt is glancing back and forth around the room and states "You will just do whatever you want to me anyway"

## 2018-05-15 NOTE — Consult Note (Addendum)
[facility] Consult   Reason for Consult:  Bizarre behavior Referring Physician:  EDP Patient Identification: PORCHA DEBLANC MRN:  [id number] Principal Diagnosis: Major depressive disorder with psychotic features [facility]) Diagnosis:   Patient Active Problem List   Diagnosis Date Noted  . Major depressive disorder with psychotic features ([place]) [F32.3] 05/11/2018    Priority: High  . Confusion [R41.0]   . Vision loss, left eye [H54.62] 02/17/2017  . Visual hallucinations [R44.1] 02/17/2017  . Neuropathy [G62.9] 02/17/2017  . Acute blood loss anemia [D62] 06/14/2015  . Hypertension [I10] 06/14/2015  . Hypothyroidism [E03.9] 06/14/2015  . Glaucoma [H40.9] 06/14/2015  . Rectal bleeding [K62.5] 06/13/2015    Total Time spent with patient: 30 minutes  Subjective:   Margaret Li is a 84 y.o. female patient admitted with bizarre behavior.  HPI:  On admission:  Pt was seen and chart reviewed with treatment team and Dr Mariea Clonts.  Pt denies suicidal/homicidal ideation, denies auditory/visual hallucinations and does not appear to be responding to internal stimuli. Pt came to the [facility] form her home after her neighbors found her outside of her house naked. Pt stated all of the allegations are not true. Pt lives alone but stated she wants to go to an ALF because she knows she needs help because she is not able to fix her food and has not been eating right. Pt stated she was trying to find an ALF before she ended up in the hospital and she stated she could give [place] the names of places we can call to speed up the process.   Pt continues to be on antibiotics for her UTI but remains paranoid that people in the ED are after her.  Believes the doctors are trying to harm her.  She is irritable and delusional.  Cooperative with care and compliant with medications.  Placement being sought.  Past Psychiatric History: As above  Risk to Self: Suicidal Ideation: No Suicidal Intent: No Is patient at  risk for suicide?: yes Suicidal Plan?: No Access to Means: No What has been your use of drugs/alcohol within the last 12 months?: Denies How many times?: 1 Other Self Harm Risks: (Altered mental state) Triggers for Past Attempts: Unknown Intentional Self Injurious Behavior: None Risk to Others: Homicidal Ideation: No Thoughts of Harm to Others: No Current Homicidal Intent: No Current Homicidal Plan: No Access to Homicidal Means: No Identified Victim: NA History of harm to others?: No Assessment of Violence: None Noted Violent Behavior Description: NA Does patient have access to weapons?: No Criminal Charges Pending?: No Does patient have a court date: No Prior Inpatient Therapy: Prior Inpatient Therapy: Yes Prior Therapy Dates: 2019 Prior Therapy Facilty/Provider(s): [facility] Reason for Treatment: S/I Prior Outpatient Therapy: Prior Outpatient Therapy: No Does patient have an ACCT team?: No Does patient have Intensive In-House Services?  : No Does patient have [facility] services? : No Does patient have P4CC services?: No  Past Medical History:  Past Medical History:  Diagnosis Date  . AAA (abdominal aortic aneurysm) ([place])    infrarenal, 3.2 cm. seen on CTA Dec 2018  . Anxiety   . Aortic calcification ([facility])   . Black tarry stools    "per patient"  . Cholelithiasis   . Chronic low back pain   . Depression   . Diverticular disease 1999   h/o diverticular block   . GERD (gastroesophageal reflux disease)   . Glaucoma   . H/O hyperthyroidism   . Hypercholesterolemia   . Hypertension   .  Hypothyroidism   . Migraine   . Osteoarthritis   . Osteoporosis   . Tubular adenoma    h/o  . Vitamin B 12 deficiency     Past Surgical History:  Procedure Laterality Date  . ABDOMINAL HYSTERECTOMY     80's  . BREAST ENHANCEMENT SURGERY    . CATARACT EXTRACTION, BILATERAL    . ESOPHAGOGASTRODUODENOSCOPY (EGD) WITH PROPOFOL N/A 08/04/2016   Procedure: ESOPHAGOGASTRODUODENOSCOPY (EGD)  WITH PROPOFOL;  Surgeon: Carol Ada, MD;  Location: [facility];  Service: Endoscopy;  Laterality: N/A;  . HERNIA REPAIR Right    Inguinal hernia repair '12 -Dr. Rise Patience  . JOINT REPLACEMENT Right    -RTHA -4 '11 - Dr. [name]- [facility]   Family History:  Family History  Problem Relation Age of Onset  . Breast cancer Sister        half sister  . Heart disease Brother        half brother   Family Psychiatric  History: Unknown Social History:  Social History   Substance and Sexual Activity  Alcohol Use No     Social History   Substance and Sexual Activity  Drug Use No    Social History   Socioeconomic History  . Marital status: Divorced    Spouse name: Not on file  . Number of children: 2  . Years of education: Not on file  . Highest education level: Not on file  Occupational History  . Not on file  Social Needs  . Financial resource strain: Not on file  . Food insecurity:    Worry: Not on file    Inability: Not on file  . Transportation needs:    Medical: Not on file    Non-medical: Not on file  Tobacco Use  . Smoking status: Former Smoker    Types: Cigarettes    Last attempt to quit: 08/01/2011    Years since quitting: 6.7  . Smokeless tobacco: Never Used  Substance and Sexual Activity  . Alcohol use: No  . Drug use: No  . Sexual activity: Not Currently  Lifestyle  . Physical activity:    Days per week: Not on file    Minutes per session: Not on file  . Stress: Not on file  Relationships  . Social connections:    Talks on phone: Not on file    Gets together: Not on file    Attends religious service: Not on file    Active member of club or organization: Not on file    Attends meetings of clubs or organizations: Not on file    Relationship status: Not on file  Other Topics Concern  . Not on file  Social History Narrative   Lives at home alone    She just returned home from Assisted Living   Right handed   Additional Social History: N/A     Allergies:   Allergies  Allergen Reactions  . Biaxin [Clarithromycin]     GI side effects  . Boniva [Ibandronic Acid]     Groin pain  . Codeine Other (See Comments)    GI side effects  . Cymbalta [Duloxetine Hcl]     GI side effects  . Hydrochlorothiazide Other (See Comments)    Increased urination   . Lexapro [Escitalopram Oxalate] Nausea Only and Other (See Comments)    dizziness  . Norvasc [Amlodipine Besylate] Other (See Comments)    Abdominal pain, bad dreams  . Prozac [Fluoxetine Hcl] Other (See Comments)    Abnormal  weight loss, GI upset   . Sulfa Antibiotics Other (See Comments)    Unknown reaction   . Trazodone Hcl Swelling and Rash    Swollen tongue     Labs:  No results found for this or any previous visit (from the past 48 hour(s)).  Current Facility-Administered Medications  Medication Dose Route Frequency Provider Last Rate Last Dose  . busPIRone (BUSPAR) tablet 5 mg  5 mg Oral BID Corena Pilgrim, MD   5 mg at 05/14/18 2303  . calcium carbonate (OS-CAL - dosed in mg of elemental calcium) tablet 500 mg of elemental calcium  1 tablet Oral Q breakfast Dorie Rank, MD   500 mg of elemental calcium at 05/15/18 0841  . cephALEXin (KEFLEX) capsule 500 mg  500 mg Oral Q12H Ethelene Hal, NP   500 mg at 05/14/18 2304  . cholecalciferol (VITAMIN D) tablet 1,000 Units  1,000 Units Oral Daily Dorie Rank, MD   1,000 Units at 05/14/18 [phone]  . cloNIDine (CATAPRES) tablet 0.1 mg  0.1 mg Oral Q8H Dorie Rank, MD   0.1 mg at 05/15/18 0841  . dorzolamide-timolol (COSOPT) 22.3-6.8 MG/ML ophthalmic solution 1 drop  1 drop Both Eyes BID Isla Pence, MD   1 drop at 05/14/18 2304  . labetalol (NORMODYNE) tablet 100 mg  100 mg Oral BID Dorie Rank, MD   100 mg at 05/14/18 2304  . levothyroxine (SYNTHROID, LEVOTHROID) tablet 25 mcg  25 mcg Oral QAC breakfast Dorie Rank, MD   25 mcg at 05/15/18 0841  . LORazepam (ATIVAN) tablet 0.5 mg  0.5 mg Oral Q8H PRN Dorie Rank, MD    0.5 mg at 05/14/18 2303  . losartan (COZAAR) tablet 100 mg  100 mg Oral Daily Dorie Rank, MD   100 mg at 05/14/18 9604  . Netarsudil Dimesylate 0.02 % SOLN 1 drop  1 drop Both Eyes QHS Dorie Rank, MD   1 drop at 05/14/18 2302  . OLANZapine zydis (ZYPREXA) disintegrating tablet 5 mg  5 mg Oral QHS Akintayo, Mojeed, MD   5 mg at 05/14/18 2303  . simvastatin (ZOCOR) tablet 5 mg  5 mg Oral QPM Dorie Rank, MD   5 mg at 05/14/18 1750  . vitamin C (ASCORBIC ACID) tablet 500 mg  500 mg Oral Daily Dorie Rank, MD   500 mg at 05/14/18 5409   Current Outpatient Medications  Medication Sig Dispense Refill  . acetaminophen (TYLENOL) 500 MG tablet Take 500 mg by mouth every 6 (six) hours as needed for mild pain or headache.    . calcium carbonate (OS-CAL) 600 MG tablet Take 600 mg by mouth daily.    . Cholecalciferol (VITAMIN D-3) 1000 units CAPS Take 1,000 Units by mouth daily.     . cloNIDine (CATAPRES) 0.1 MG tablet Take 0.1 mg by mouth every 8 (eight) hours.  1  . dorzolamide-timolol (COSOPT) 22.3-6.8 MG/ML ophthalmic solution Place 1 drop into both eyes 2 (two) times daily.    Marland Kitchen labetalol (NORMODYNE) 100 MG tablet Take 100 mg by mouth 2 (two) times daily.     Marland Kitchen levothyroxine (SYNTHROID, LEVOTHROID) 25 MCG tablet Take 25 mcg by mouth daily before breakfast.    . LORazepam (ATIVAN) 0.5 MG tablet Take 1 tablet (0.5 mg total) by mouth every 8 (eight) hours as needed for anxiety. (Patient not taking: Reported on 04/05/2018) 10 tablet 0  . LORazepam (ATIVAN) 2 MG tablet Take 2 mg by mouth at bedtime.     Marland Kitchen losartan (COZAAR)  50 MG tablet Take 100 mg by mouth daily.     . Netarsudil Dimesylate 0.02 % SOLN Place 1 drop into both eyes at bedtime. "Rhopressa"    . PARoxetine (PAXIL) 10 MG tablet Take 10 mg by mouth daily.  1  . simvastatin (ZOCOR) 10 MG tablet Take 5 mg by mouth every evening.     . vitamin C (ASCORBIC ACID) 500 MG tablet Take 500 mg by mouth daily.      Musculoskeletal: Strength & Muscle Tone:  within normal limits Gait & Station: normal Patient leans: N/A  Psychiatric Specialty Exam: Physical Exam  Nursing note and vitals reviewed. Constitutional: She is oriented to person, place, and time. She appears well-developed and well-nourished.  HENT:  Head: Normocephalic and atraumatic.  Neck: Normal range of motion.  Cardiovascular: Normal rate.  Respiratory: Effort normal.  Musculoskeletal: Normal range of motion.  Neurological: She is alert and oriented to person, place, and time.  Psychiatric: Her speech is normal. Her mood appears anxious. She is agitated. Thought content is paranoid and delusional. Cognition and memory are normal. She expresses impulsivity. She exhibits a depressed mood.    Review of Systems  Psychiatric/Behavioral: Positive for depression and hallucinations. Negative for memory loss, substance abuse and suicidal ideas. The patient is nervous/anxious. The patient does not have insomnia.   All other systems reviewed and are negative.   Blood pressure (!) 151/85, pulse 61, temperature 98.2 F (36.8 C), temperature source Oral, resp. rate 18, height 5\' 5"  (1.651 m), weight 54.4 kg (120 lb), SpO2 99 %.Body mass index is 19.97 kg/m.  General Appearance: Casual  Eye Contact:  Good  Speech:  Clear and Coherent  Volume:  Normal  Mood:  Anxious and Depressed  Affect:  Congruent  Thought Process:  Coherent, Linear and Descriptions of Associations: Intact  Orientation:  Full (Time, Place, and Person)  Thought Content:  Delusions, Paranoid Ideation and Rumination  Suicidal Thoughts:  No  Homicidal Thoughts:  No  Memory:  Immediate;   Good Recent;   Good Remote;   Fair  Judgement:  Impaired  Insight:  Fair  Psychomotor Activity:  Normal  Concentration:  Concentration: Good and Attention Span: Good  Recall:  Good  Fund of Knowledge:  Good  Language:  Good  Akathisia:  No  Handed:  Right  AIMS (if indicated):   N/A  Assets:  Sales promotion account executive Housing  ADL's:  Intact  Cognition:  WNL  Sleep:   N/A    Treatment Plan Summary: Daily contact with patient to assess and evaluate symptoms and progress in treatment and Medication management  Crisis stabilization Chart and Labs reviewed Continue these medications: -Buspar 5 mg twice daily for anxiety -Ativan 0.5 mg every 8 hours as needed for anxiety -Increase Zyprexa 5 mg at bedtime for mood stabilization to 5 mg BID. -Keflex 500 mg twice daily for UTI -Normodyne 100 mg twice daily beta blocker -Cozaar 100 mg daily for hypertension -Synthroid 25 mcg once daily for thyroid disease   Disposition: Recommend psychiatric Inpatient admission when medically cleared. TTS to seek placement in a [facility]  Waylan Boga, NP 05/15/2018 11:01 AM    Patient seen face-to-face for psychiatric evaluation, chart reviewed and case discussed with the physician extender and developed treatment plan. Reviewed the information documented and agree with the treatment plan.  Buford Dresser, DO 05/15/18 3:28 PM

## 2018-05-15 NOTE — BH Assessment (Signed)
[facility] Assessment Progress Note  Per Buford Dresser, DO, this pt continues to require psychiatric hospitalization at this time.  Pt remains under IVC initiated by Dr Mariea Clonts.  The following facilities have been contacted to seek placement for this pt, with results as noted:  Beds available, information sent, decision pending:  [facility] (supplemental information sent, with [facility] confirming receipt)   At capacity:  Felix Pacini [place], [place] Coordinator [phone]

## 2018-05-15 NOTE — Progress Notes (Addendum)
CSW received a call from Amy Blanch Media  Pt has been accepted by: [facility] Number for report is: [phone] Pt's unit/room/bed number will be: 37th Floor [address] Accepting physician: Dr. Caralyn Guile   Pt can arrive ASAP on 7/10 or on 7/11.   CSW will update RN.  Pt is IVC'd and will need IVC transport.  CSW called Lenard Forth, pt's grandson who wished to be updated and informed pt's grandson of probable plan, but cautioned pt's grandson that this plan is not assured until pt actually transports to [place].  Pt's grandson updated.  RN updated.   [name]. Dvora Buitron, LCSW, LCAS, CSI Clinical Social Worker Ph: [phone]

## 2018-05-16 DIAGNOSIS — H409 Unspecified glaucoma: Secondary | ICD-10-CM | POA: Diagnosis not present

## 2018-05-16 DIAGNOSIS — E538 Deficiency of other specified B group vitamins: Secondary | ICD-10-CM | POA: Diagnosis not present

## 2018-05-16 DIAGNOSIS — R9431 Abnormal electrocardiogram [ECG] [EKG]: Secondary | ICD-10-CM | POA: Diagnosis not present

## 2018-05-16 DIAGNOSIS — M545 Low back pain: Secondary | ICD-10-CM | POA: Diagnosis not present

## 2018-05-16 DIAGNOSIS — R0789 Other chest pain: Secondary | ICD-10-CM | POA: Diagnosis not present

## 2018-05-16 DIAGNOSIS — Z882 Allergy status to sulfonamides status: Secondary | ICD-10-CM | POA: Diagnosis not present

## 2018-05-16 DIAGNOSIS — R69 Illness, unspecified: Secondary | ICD-10-CM | POA: Diagnosis not present

## 2018-05-16 DIAGNOSIS — K8020 Calculus of gallbladder without cholecystitis without obstruction: Secondary | ICD-10-CM | POA: Diagnosis not present

## 2018-05-16 DIAGNOSIS — Z66 Do not resuscitate: Secondary | ICD-10-CM | POA: Diagnosis not present

## 2018-05-16 DIAGNOSIS — Z888 Allergy status to other drugs, medicaments and biological substances status: Secondary | ICD-10-CM | POA: Diagnosis not present

## 2018-05-16 DIAGNOSIS — E890 Postprocedural hypothyroidism: Secondary | ICD-10-CM | POA: Diagnosis not present

## 2018-05-16 DIAGNOSIS — G8929 Other chronic pain: Secondary | ICD-10-CM | POA: Diagnosis not present

## 2018-05-16 DIAGNOSIS — I452 Bifascicular block: Secondary | ICD-10-CM | POA: Diagnosis not present

## 2018-05-16 DIAGNOSIS — F323 Major depressive disorder, single episode, severe with psychotic features: Secondary | ICD-10-CM | POA: Diagnosis not present

## 2018-05-16 DIAGNOSIS — Z87891 Personal history of nicotine dependence: Secondary | ICD-10-CM | POA: Diagnosis not present

## 2018-05-16 DIAGNOSIS — Z7189 Other specified counseling: Secondary | ICD-10-CM | POA: Diagnosis not present

## 2018-05-16 DIAGNOSIS — D539 Nutritional anemia, unspecified: Secondary | ICD-10-CM | POA: Diagnosis not present

## 2018-05-16 DIAGNOSIS — I10 Essential (primary) hypertension: Secondary | ICD-10-CM | POA: Diagnosis not present

## 2018-05-16 DIAGNOSIS — F333 Major depressive disorder, recurrent, severe with psychotic symptoms: Secondary | ICD-10-CM | POA: Diagnosis not present

## 2018-05-16 DIAGNOSIS — E7800 Pure hypercholesterolemia, unspecified: Secondary | ICD-10-CM | POA: Diagnosis not present

## 2018-05-16 DIAGNOSIS — Z515 Encounter for palliative care: Secondary | ICD-10-CM | POA: Diagnosis not present

## 2018-05-16 DIAGNOSIS — Z79899 Other long term (current) drug therapy: Secondary | ICD-10-CM | POA: Diagnosis not present

## 2018-05-16 DIAGNOSIS — Z885 Allergy status to narcotic agent status: Secondary | ICD-10-CM | POA: Diagnosis not present

## 2018-05-16 DIAGNOSIS — I714 Abdominal aortic aneurysm, without rupture: Secondary | ICD-10-CM | POA: Diagnosis not present

## 2018-05-16 NOTE — ED Notes (Signed)
[place] request RN to call [phone] when pt is leaving the facility.

## 2018-05-16 NOTE — ED Notes (Addendum)
IVC PAPERS GIVEN TO SHERIFF OFFICER [employer]. TRANSFER PT TO [place].

## 2018-05-16 NOTE — ED Notes (Signed)
ED TO INPATIENT HANDOFF REPORT  Name/Age/Gender Margaret Li 84 y.o. female  Code Status    Code Status Orders  (From admission, onward)        Start     Ordered   05/10/18 1431  Full code  Continuous     05/10/18 1431    Code Status History    Date Active Date Inactive Code Status Order ID Comments User Context   04/05/2018 1529 04/06/2018 1540 Full Code [id number]  Isla Pence, MD ED   06/14/2015 0038 06/18/2015 1504 Full Code [id number]  Lavina Hamman, MD ED      Home/SNF/Other Home  Chief Complaint ams  Level of Care/Admitting Diagnosis ED Disposition    ED Disposition Condition Comment   Transfer to Another Facility  Transfer to [facility]  Accepting provider is Dr Alonna Minium, Attending provider is Dr Metta Clines      Medical History Past Medical History:  Diagnosis Date  . AAA (abdominal aortic aneurysm) ([place])    infrarenal, 3.2 cm. seen on CTA Dec 2018  . Anxiety   . Aortic calcification ([facility])   . Black tarry stools    "per patient"  . Cholelithiasis   . Chronic low back pain   . Depression   . Diverticular disease 1999   h/o diverticular block   . GERD (gastroesophageal reflux disease)   . Glaucoma   . H/O hyperthyroidism   . Hypercholesterolemia   . Hypertension   . Hypothyroidism   . Migraine   . Osteoarthritis   . Osteoporosis   . Tubular adenoma    h/o  . Vitamin B 12 deficiency     Allergies Allergies  Allergen Reactions  . Biaxin [Clarithromycin]     GI side effects  . Boniva [Ibandronic Acid]     Groin pain  . Codeine Other (See Comments)    GI side effects  . Cymbalta [Duloxetine Hcl]     GI side effects  . Hydrochlorothiazide Other (See Comments)    Increased urination   . Lexapro [Escitalopram Oxalate] Nausea Only and Other (See Comments)    dizziness  . Norvasc [Amlodipine Besylate] Other (See Comments)    Abdominal pain, bad dreams  . Prozac [Fluoxetine Hcl] Other (See Comments)    Abnormal weight loss,  GI upset   . Sulfa Antibiotics Other (See Comments)    Unknown reaction   . Trazodone Hcl Swelling and Rash    Swollen tongue     IV Location/Drains/Wounds Patient Lines/Drains/Airways Status   Active Line/Drains/Airways    None          Labs/Imaging No results found for this or any previous visit (from the past 48 hour(s)). No results found.  Pending Labs Unresulted Labs (From admission, onward)   None      Vitals/Pain Today's Vitals   05/15/18 1750 05/15/18 1907 05/15/18 2309 05/16/18 0630  BP: (!) 174/98 136/73 135/86 (!) 175/92  Pulse: 64 63 68 (!) 59  Resp: 20 18 15 16   Temp:  98.5 F (36.9 C) 99 F (37.2 C) 98.3 F (36.8 C)  TempSrc:  Oral Oral Oral  SpO2: 98% 100% 98% 98%  Weight:      Height:      PainSc:        Isolation Precautions No active isolations  Medications Medications  calcium carbonate (OS-CAL - dosed in mg of elemental calcium) tablet 500 mg of elemental calcium (500 mg of elemental calcium Oral Refused 05/16/18 0845)  cholecalciferol (  VITAMIN D) tablet 1,000 Units (1,000 Units Oral Given 05/15/18 1032)  cloNIDine (CATAPRES) tablet 0.1 mg (0.1 mg Oral Given 05/16/18 0826)  labetalol (NORMODYNE) tablet 100 mg (100 mg Oral Given 05/15/18 2300)  levothyroxine (SYNTHROID, LEVOTHROID) tablet 25 mcg (25 mcg Oral Given 05/16/18 0826)  LORazepam (ATIVAN) tablet 0.5 mg (0.5 mg Oral Given 05/14/18 2303)  losartan (COZAAR) tablet 100 mg (100 mg Oral Given 05/15/18 1031)  Netarsudil Dimesylate 0.02 % SOLN 1 drop (1 drop Both Eyes Not Given 05/15/18 2322)  simvastatin (ZOCOR) tablet 5 mg (5 mg Oral Given 05/15/18 1803)  vitamin C (ASCORBIC ACID) tablet 500 mg (500 mg Oral Given 05/15/18 1033)  cephALEXin (KEFLEX) capsule 500 mg (500 mg Oral Given 05/15/18 2300)  busPIRone (BUSPAR) tablet 5 mg (5 mg Oral Given 05/15/18 2300)  dorzolamide-timolol (COSOPT) 22.3-6.8 MG/ML ophthalmic solution 1 drop (1 drop Both Eyes Given 05/15/18 2300)  OLANZapine zydis (ZYPREXA)  disintegrating tablet 5 mg (5 mg Oral Given 05/15/18 2300)  docusate sodium (COLACE) capsule 100 mg (100 mg Oral Given 05/15/18 1803)    Mobility walks with person assist

## 2018-05-16 NOTE — ED Notes (Signed)
Pt was scanned and medication was scanned for medication administration, but the rover epic (iphone scanner) reported an error and said the system had timed out, therefore the documentation showed as incomplete.  Pt was properly identified and medications scanned, but RN was unable to scan the medications again as the packages has been opened and barcodes were rendered unreadable, so RN documented "Barcode unreadable"

## 2018-05-16 NOTE — ED Notes (Signed)
Pt walked to bathroom with NT. Pt showered

## 2018-05-16 NOTE — ED Notes (Signed)
UPDATED RN AT [facility] WITH CURRENT PT STATUS. VITALS AT DISCHARGE GIVEN. BP MEDICATION GIVEN AT DISCHARGE.

## 2018-05-16 NOTE — ED Notes (Signed)
FAMILY (MILDRED/COUSIN) CALLED. PT REQUESTED NOT TO SPEAK WITH ANYONE AT THIS TIME. FAMILY UPDATED PT IN STABLE CONDITION.

## 2018-05-16 NOTE — ED Notes (Signed)
RN had to argue with pt for 20 minutes to convince pt to take her synthroid and her clonidine

## 2018-05-17 DIAGNOSIS — K8020 Calculus of gallbladder without cholecystitis without obstruction: Secondary | ICD-10-CM | POA: Diagnosis not present

## 2018-05-17 DIAGNOSIS — I452 Bifascicular block: Secondary | ICD-10-CM | POA: Diagnosis not present

## 2018-05-17 DIAGNOSIS — R9431 Abnormal electrocardiogram [ECG] [EKG]: Secondary | ICD-10-CM | POA: Diagnosis not present

## 2018-05-17 DIAGNOSIS — E890 Postprocedural hypothyroidism: Secondary | ICD-10-CM | POA: Diagnosis not present

## 2018-05-17 DIAGNOSIS — M545 Low back pain: Secondary | ICD-10-CM | POA: Diagnosis not present

## 2018-05-17 DIAGNOSIS — I714 Abdominal aortic aneurysm, without rupture: Secondary | ICD-10-CM | POA: Diagnosis not present

## 2018-05-17 DIAGNOSIS — E538 Deficiency of other specified B group vitamins: Secondary | ICD-10-CM | POA: Diagnosis not present

## 2018-05-17 DIAGNOSIS — H409 Unspecified glaucoma: Secondary | ICD-10-CM | POA: Diagnosis not present

## 2018-05-17 DIAGNOSIS — I10 Essential (primary) hypertension: Secondary | ICD-10-CM | POA: Diagnosis not present

## 2018-05-17 DIAGNOSIS — E7800 Pure hypercholesterolemia, unspecified: Secondary | ICD-10-CM | POA: Diagnosis not present

## 2018-05-20 DIAGNOSIS — D539 Nutritional anemia, unspecified: Secondary | ICD-10-CM | POA: Diagnosis not present

## 2018-05-20 DIAGNOSIS — H409 Unspecified glaucoma: Secondary | ICD-10-CM | POA: Diagnosis not present

## 2018-05-20 DIAGNOSIS — I452 Bifascicular block: Secondary | ICD-10-CM | POA: Diagnosis not present

## 2018-05-20 DIAGNOSIS — I10 Essential (primary) hypertension: Secondary | ICD-10-CM | POA: Diagnosis not present

## 2018-05-20 DIAGNOSIS — R9431 Abnormal electrocardiogram [ECG] [EKG]: Secondary | ICD-10-CM | POA: Diagnosis not present

## 2018-05-20 DIAGNOSIS — K8020 Calculus of gallbladder without cholecystitis without obstruction: Secondary | ICD-10-CM | POA: Diagnosis not present

## 2018-05-20 DIAGNOSIS — R69 Illness, unspecified: Secondary | ICD-10-CM | POA: Diagnosis not present

## 2018-05-20 DIAGNOSIS — M545 Low back pain: Secondary | ICD-10-CM | POA: Diagnosis not present

## 2018-05-20 DIAGNOSIS — I714 Abdominal aortic aneurysm, without rupture: Secondary | ICD-10-CM | POA: Diagnosis not present

## 2018-05-20 DIAGNOSIS — E7800 Pure hypercholesterolemia, unspecified: Secondary | ICD-10-CM | POA: Diagnosis not present

## 2018-05-21 DIAGNOSIS — K8020 Calculus of gallbladder without cholecystitis without obstruction: Secondary | ICD-10-CM | POA: Diagnosis not present

## 2018-05-21 DIAGNOSIS — I10 Essential (primary) hypertension: Secondary | ICD-10-CM | POA: Diagnosis not present

## 2018-05-21 DIAGNOSIS — Z7189 Other specified counseling: Secondary | ICD-10-CM | POA: Diagnosis not present

## 2018-05-21 DIAGNOSIS — E7800 Pure hypercholesterolemia, unspecified: Secondary | ICD-10-CM | POA: Diagnosis not present

## 2018-05-21 DIAGNOSIS — I452 Bifascicular block: Secondary | ICD-10-CM | POA: Diagnosis not present

## 2018-05-21 DIAGNOSIS — H409 Unspecified glaucoma: Secondary | ICD-10-CM | POA: Diagnosis not present

## 2018-05-21 DIAGNOSIS — R9431 Abnormal electrocardiogram [ECG] [EKG]: Secondary | ICD-10-CM | POA: Diagnosis not present

## 2018-05-21 DIAGNOSIS — M545 Low back pain: Secondary | ICD-10-CM | POA: Diagnosis not present

## 2018-05-21 DIAGNOSIS — I714 Abdominal aortic aneurysm, without rupture: Secondary | ICD-10-CM | POA: Diagnosis not present

## 2018-05-21 DIAGNOSIS — Z515 Encounter for palliative care: Secondary | ICD-10-CM | POA: Diagnosis not present

## 2018-05-21 DIAGNOSIS — R69 Illness, unspecified: Secondary | ICD-10-CM | POA: Diagnosis not present

## 2018-05-21 DIAGNOSIS — R0789 Other chest pain: Secondary | ICD-10-CM | POA: Diagnosis not present

## 2018-05-21 DIAGNOSIS — D539 Nutritional anemia, unspecified: Secondary | ICD-10-CM | POA: Diagnosis not present

## 2018-05-22 DIAGNOSIS — D539 Nutritional anemia, unspecified: Secondary | ICD-10-CM | POA: Diagnosis not present

## 2018-05-22 DIAGNOSIS — K8020 Calculus of gallbladder without cholecystitis without obstruction: Secondary | ICD-10-CM | POA: Diagnosis not present

## 2018-05-22 DIAGNOSIS — M545 Low back pain: Secondary | ICD-10-CM | POA: Diagnosis not present

## 2018-05-22 DIAGNOSIS — H409 Unspecified glaucoma: Secondary | ICD-10-CM | POA: Diagnosis not present

## 2018-05-22 DIAGNOSIS — R9431 Abnormal electrocardiogram [ECG] [EKG]: Secondary | ICD-10-CM | POA: Diagnosis not present

## 2018-05-22 DIAGNOSIS — E7800 Pure hypercholesterolemia, unspecified: Secondary | ICD-10-CM | POA: Diagnosis not present

## 2018-05-22 DIAGNOSIS — I10 Essential (primary) hypertension: Secondary | ICD-10-CM | POA: Diagnosis not present

## 2018-05-22 DIAGNOSIS — R69 Illness, unspecified: Secondary | ICD-10-CM | POA: Diagnosis not present

## 2018-05-22 DIAGNOSIS — Z515 Encounter for palliative care: Secondary | ICD-10-CM | POA: Diagnosis not present

## 2018-05-22 DIAGNOSIS — R0789 Other chest pain: Secondary | ICD-10-CM | POA: Diagnosis not present

## 2018-05-22 DIAGNOSIS — Z7189 Other specified counseling: Secondary | ICD-10-CM | POA: Diagnosis not present

## 2018-05-22 DIAGNOSIS — I452 Bifascicular block: Secondary | ICD-10-CM | POA: Diagnosis not present

## 2018-05-22 DIAGNOSIS — I714 Abdominal aortic aneurysm, without rupture: Secondary | ICD-10-CM | POA: Diagnosis not present

## 2018-05-23 DIAGNOSIS — E7800 Pure hypercholesterolemia, unspecified: Secondary | ICD-10-CM | POA: Diagnosis not present

## 2018-05-23 DIAGNOSIS — H409 Unspecified glaucoma: Secondary | ICD-10-CM | POA: Diagnosis not present

## 2018-05-23 DIAGNOSIS — R69 Illness, unspecified: Secondary | ICD-10-CM | POA: Diagnosis not present

## 2018-05-23 DIAGNOSIS — R9431 Abnormal electrocardiogram [ECG] [EKG]: Secondary | ICD-10-CM | POA: Diagnosis not present

## 2018-05-23 DIAGNOSIS — K8020 Calculus of gallbladder without cholecystitis without obstruction: Secondary | ICD-10-CM | POA: Diagnosis not present

## 2018-05-23 DIAGNOSIS — M545 Low back pain: Secondary | ICD-10-CM | POA: Diagnosis not present

## 2018-05-23 DIAGNOSIS — I10 Essential (primary) hypertension: Secondary | ICD-10-CM | POA: Diagnosis not present

## 2018-05-23 DIAGNOSIS — I452 Bifascicular block: Secondary | ICD-10-CM | POA: Diagnosis not present

## 2018-05-23 DIAGNOSIS — D539 Nutritional anemia, unspecified: Secondary | ICD-10-CM | POA: Diagnosis not present

## 2018-05-23 DIAGNOSIS — I714 Abdominal aortic aneurysm, without rupture: Secondary | ICD-10-CM | POA: Diagnosis not present

## 2018-05-24 DIAGNOSIS — Z515 Encounter for palliative care: Secondary | ICD-10-CM | POA: Diagnosis not present

## 2018-05-24 DIAGNOSIS — H409 Unspecified glaucoma: Secondary | ICD-10-CM | POA: Diagnosis not present

## 2018-05-24 DIAGNOSIS — R0789 Other chest pain: Secondary | ICD-10-CM | POA: Diagnosis not present

## 2018-05-24 DIAGNOSIS — Z7189 Other specified counseling: Secondary | ICD-10-CM | POA: Diagnosis not present

## 2018-05-24 DIAGNOSIS — I452 Bifascicular block: Secondary | ICD-10-CM | POA: Diagnosis not present

## 2018-05-24 DIAGNOSIS — K8020 Calculus of gallbladder without cholecystitis without obstruction: Secondary | ICD-10-CM | POA: Diagnosis not present

## 2018-05-24 DIAGNOSIS — D539 Nutritional anemia, unspecified: Secondary | ICD-10-CM | POA: Diagnosis not present

## 2018-05-24 DIAGNOSIS — R9431 Abnormal electrocardiogram [ECG] [EKG]: Secondary | ICD-10-CM | POA: Diagnosis not present

## 2018-05-24 DIAGNOSIS — I714 Abdominal aortic aneurysm, without rupture: Secondary | ICD-10-CM | POA: Diagnosis not present

## 2018-05-24 DIAGNOSIS — R69 Illness, unspecified: Secondary | ICD-10-CM | POA: Diagnosis not present

## 2018-05-24 DIAGNOSIS — E7800 Pure hypercholesterolemia, unspecified: Secondary | ICD-10-CM | POA: Diagnosis not present

## 2018-05-24 DIAGNOSIS — M545 Low back pain: Secondary | ICD-10-CM | POA: Diagnosis not present

## 2018-05-24 DIAGNOSIS — I10 Essential (primary) hypertension: Secondary | ICD-10-CM | POA: Diagnosis not present

## 2018-05-25 DIAGNOSIS — E7800 Pure hypercholesterolemia, unspecified: Secondary | ICD-10-CM | POA: Diagnosis not present

## 2018-05-25 DIAGNOSIS — H409 Unspecified glaucoma: Secondary | ICD-10-CM | POA: Diagnosis not present

## 2018-05-25 DIAGNOSIS — I452 Bifascicular block: Secondary | ICD-10-CM | POA: Diagnosis not present

## 2018-05-25 DIAGNOSIS — I714 Abdominal aortic aneurysm, without rupture: Secondary | ICD-10-CM | POA: Diagnosis not present

## 2018-05-25 DIAGNOSIS — R9431 Abnormal electrocardiogram [ECG] [EKG]: Secondary | ICD-10-CM | POA: Diagnosis not present

## 2018-05-25 DIAGNOSIS — K8020 Calculus of gallbladder without cholecystitis without obstruction: Secondary | ICD-10-CM | POA: Diagnosis not present

## 2018-05-25 DIAGNOSIS — M545 Low back pain: Secondary | ICD-10-CM | POA: Diagnosis not present

## 2018-05-25 DIAGNOSIS — D539 Nutritional anemia, unspecified: Secondary | ICD-10-CM | POA: Diagnosis not present

## 2018-05-25 DIAGNOSIS — I10 Essential (primary) hypertension: Secondary | ICD-10-CM | POA: Diagnosis not present

## 2018-05-25 DIAGNOSIS — R69 Illness, unspecified: Secondary | ICD-10-CM | POA: Diagnosis not present

## 2018-05-28 DIAGNOSIS — R9431 Abnormal electrocardiogram [ECG] [EKG]: Secondary | ICD-10-CM | POA: Diagnosis not present

## 2018-05-28 DIAGNOSIS — I10 Essential (primary) hypertension: Secondary | ICD-10-CM | POA: Diagnosis not present

## 2018-05-28 DIAGNOSIS — R69 Illness, unspecified: Secondary | ICD-10-CM | POA: Diagnosis not present

## 2018-05-30 DIAGNOSIS — M545 Low back pain: Secondary | ICD-10-CM | POA: Diagnosis not present

## 2018-05-30 DIAGNOSIS — K8020 Calculus of gallbladder without cholecystitis without obstruction: Secondary | ICD-10-CM | POA: Diagnosis not present

## 2018-05-30 DIAGNOSIS — I452 Bifascicular block: Secondary | ICD-10-CM | POA: Diagnosis not present

## 2018-05-30 DIAGNOSIS — I714 Abdominal aortic aneurysm, without rupture: Secondary | ICD-10-CM | POA: Diagnosis not present

## 2018-05-30 DIAGNOSIS — H409 Unspecified glaucoma: Secondary | ICD-10-CM | POA: Diagnosis not present

## 2018-05-30 DIAGNOSIS — I10 Essential (primary) hypertension: Secondary | ICD-10-CM | POA: Diagnosis not present

## 2018-05-30 DIAGNOSIS — D539 Nutritional anemia, unspecified: Secondary | ICD-10-CM | POA: Diagnosis not present

## 2018-05-30 DIAGNOSIS — E890 Postprocedural hypothyroidism: Secondary | ICD-10-CM | POA: Diagnosis not present

## 2018-05-30 DIAGNOSIS — R9431 Abnormal electrocardiogram [ECG] [EKG]: Secondary | ICD-10-CM | POA: Diagnosis not present

## 2018-05-30 DIAGNOSIS — E7800 Pure hypercholesterolemia, unspecified: Secondary | ICD-10-CM | POA: Diagnosis not present

## 2018-06-10 DIAGNOSIS — D649 Anemia, unspecified: Secondary | ICD-10-CM | POA: Diagnosis not present

## 2018-06-10 DIAGNOSIS — Z23 Encounter for immunization: Secondary | ICD-10-CM | POA: Diagnosis not present

## 2018-06-10 DIAGNOSIS — F411 Generalized anxiety disorder: Secondary | ICD-10-CM | POA: Diagnosis not present

## 2018-06-10 DIAGNOSIS — F333 Major depressive disorder, recurrent, severe with psychotic symptoms: Secondary | ICD-10-CM | POA: Diagnosis not present

## 2018-06-10 DIAGNOSIS — E538 Deficiency of other specified B group vitamins: Secondary | ICD-10-CM | POA: Diagnosis not present

## 2018-06-10 DIAGNOSIS — I10 Essential (primary) hypertension: Secondary | ICD-10-CM | POA: Diagnosis not present

## 2018-06-10 DIAGNOSIS — E039 Hypothyroidism, unspecified: Secondary | ICD-10-CM | POA: Diagnosis not present

## 2018-06-10 DIAGNOSIS — R69 Illness, unspecified: Secondary | ICD-10-CM | POA: Diagnosis not present

## 2018-06-17 DIAGNOSIS — R2681 Unsteadiness on feet: Secondary | ICD-10-CM | POA: Diagnosis not present

## 2018-06-17 DIAGNOSIS — R488 Other symbolic dysfunctions: Secondary | ICD-10-CM | POA: Diagnosis not present

## 2018-06-17 DIAGNOSIS — M6281 Muscle weakness (generalized): Secondary | ICD-10-CM | POA: Diagnosis not present

## 2018-06-17 DIAGNOSIS — N3946 Mixed incontinence: Secondary | ICD-10-CM | POA: Diagnosis not present

## 2018-06-17 DIAGNOSIS — R262 Difficulty in walking, not elsewhere classified: Secondary | ICD-10-CM | POA: Diagnosis not present

## 2018-06-18 DIAGNOSIS — N3946 Mixed incontinence: Secondary | ICD-10-CM | POA: Diagnosis not present

## 2018-06-18 DIAGNOSIS — M6281 Muscle weakness (generalized): Secondary | ICD-10-CM | POA: Diagnosis not present

## 2018-06-18 DIAGNOSIS — R262 Difficulty in walking, not elsewhere classified: Secondary | ICD-10-CM | POA: Diagnosis not present

## 2018-06-18 DIAGNOSIS — R2681 Unsteadiness on feet: Secondary | ICD-10-CM | POA: Diagnosis not present

## 2018-06-18 DIAGNOSIS — R488 Other symbolic dysfunctions: Secondary | ICD-10-CM | POA: Diagnosis not present

## 2018-06-19 DIAGNOSIS — R262 Difficulty in walking, not elsewhere classified: Secondary | ICD-10-CM | POA: Diagnosis not present

## 2018-06-19 DIAGNOSIS — M6281 Muscle weakness (generalized): Secondary | ICD-10-CM | POA: Diagnosis not present

## 2018-06-19 DIAGNOSIS — R488 Other symbolic dysfunctions: Secondary | ICD-10-CM | POA: Diagnosis not present

## 2018-06-19 DIAGNOSIS — N3946 Mixed incontinence: Secondary | ICD-10-CM | POA: Diagnosis not present

## 2018-06-19 DIAGNOSIS — R2681 Unsteadiness on feet: Secondary | ICD-10-CM | POA: Diagnosis not present

## 2018-06-20 DIAGNOSIS — R262 Difficulty in walking, not elsewhere classified: Secondary | ICD-10-CM | POA: Diagnosis not present

## 2018-06-20 DIAGNOSIS — R2681 Unsteadiness on feet: Secondary | ICD-10-CM | POA: Diagnosis not present

## 2018-06-20 DIAGNOSIS — R488 Other symbolic dysfunctions: Secondary | ICD-10-CM | POA: Diagnosis not present

## 2018-06-20 DIAGNOSIS — N3946 Mixed incontinence: Secondary | ICD-10-CM | POA: Diagnosis not present

## 2018-06-20 DIAGNOSIS — M6281 Muscle weakness (generalized): Secondary | ICD-10-CM | POA: Diagnosis not present

## 2018-06-21 DIAGNOSIS — N3946 Mixed incontinence: Secondary | ICD-10-CM | POA: Diagnosis not present

## 2018-06-21 DIAGNOSIS — R2681 Unsteadiness on feet: Secondary | ICD-10-CM | POA: Diagnosis not present

## 2018-06-21 DIAGNOSIS — R262 Difficulty in walking, not elsewhere classified: Secondary | ICD-10-CM | POA: Diagnosis not present

## 2018-06-21 DIAGNOSIS — R488 Other symbolic dysfunctions: Secondary | ICD-10-CM | POA: Diagnosis not present

## 2018-06-21 DIAGNOSIS — M6281 Muscle weakness (generalized): Secondary | ICD-10-CM | POA: Diagnosis not present

## 2018-06-24 DIAGNOSIS — R488 Other symbolic dysfunctions: Secondary | ICD-10-CM | POA: Diagnosis not present

## 2018-06-24 DIAGNOSIS — R2681 Unsteadiness on feet: Secondary | ICD-10-CM | POA: Diagnosis not present

## 2018-06-24 DIAGNOSIS — R262 Difficulty in walking, not elsewhere classified: Secondary | ICD-10-CM | POA: Diagnosis not present

## 2018-06-24 DIAGNOSIS — N3946 Mixed incontinence: Secondary | ICD-10-CM | POA: Diagnosis not present

## 2018-06-24 DIAGNOSIS — M6281 Muscle weakness (generalized): Secondary | ICD-10-CM | POA: Diagnosis not present

## 2018-06-25 DIAGNOSIS — R488 Other symbolic dysfunctions: Secondary | ICD-10-CM | POA: Diagnosis not present

## 2018-06-25 DIAGNOSIS — M6281 Muscle weakness (generalized): Secondary | ICD-10-CM | POA: Diagnosis not present

## 2018-06-25 DIAGNOSIS — R262 Difficulty in walking, not elsewhere classified: Secondary | ICD-10-CM | POA: Diagnosis not present

## 2018-06-25 DIAGNOSIS — R2681 Unsteadiness on feet: Secondary | ICD-10-CM | POA: Diagnosis not present

## 2018-06-25 DIAGNOSIS — N3946 Mixed incontinence: Secondary | ICD-10-CM | POA: Diagnosis not present

## 2018-06-26 DIAGNOSIS — N3946 Mixed incontinence: Secondary | ICD-10-CM | POA: Diagnosis not present

## 2018-06-26 DIAGNOSIS — R488 Other symbolic dysfunctions: Secondary | ICD-10-CM | POA: Diagnosis not present

## 2018-06-26 DIAGNOSIS — M6281 Muscle weakness (generalized): Secondary | ICD-10-CM | POA: Diagnosis not present

## 2018-06-26 DIAGNOSIS — R2681 Unsteadiness on feet: Secondary | ICD-10-CM | POA: Diagnosis not present

## 2018-06-26 DIAGNOSIS — R262 Difficulty in walking, not elsewhere classified: Secondary | ICD-10-CM | POA: Diagnosis not present

## 2018-06-27 DIAGNOSIS — R488 Other symbolic dysfunctions: Secondary | ICD-10-CM | POA: Diagnosis not present

## 2018-06-27 DIAGNOSIS — M6281 Muscle weakness (generalized): Secondary | ICD-10-CM | POA: Diagnosis not present

## 2018-06-27 DIAGNOSIS — R2681 Unsteadiness on feet: Secondary | ICD-10-CM | POA: Diagnosis not present

## 2018-06-27 DIAGNOSIS — R262 Difficulty in walking, not elsewhere classified: Secondary | ICD-10-CM | POA: Diagnosis not present

## 2018-06-27 DIAGNOSIS — N3946 Mixed incontinence: Secondary | ICD-10-CM | POA: Diagnosis not present

## 2018-06-28 DIAGNOSIS — R262 Difficulty in walking, not elsewhere classified: Secondary | ICD-10-CM | POA: Diagnosis not present

## 2018-06-28 DIAGNOSIS — N3946 Mixed incontinence: Secondary | ICD-10-CM | POA: Diagnosis not present

## 2018-06-28 DIAGNOSIS — M6281 Muscle weakness (generalized): Secondary | ICD-10-CM | POA: Diagnosis not present

## 2018-06-28 DIAGNOSIS — R488 Other symbolic dysfunctions: Secondary | ICD-10-CM | POA: Diagnosis not present

## 2018-06-28 DIAGNOSIS — R2681 Unsteadiness on feet: Secondary | ICD-10-CM | POA: Diagnosis not present

## 2018-07-03 DIAGNOSIS — R262 Difficulty in walking, not elsewhere classified: Secondary | ICD-10-CM | POA: Diagnosis not present

## 2018-07-03 DIAGNOSIS — R2681 Unsteadiness on feet: Secondary | ICD-10-CM | POA: Diagnosis not present

## 2018-07-03 DIAGNOSIS — N3946 Mixed incontinence: Secondary | ICD-10-CM | POA: Diagnosis not present

## 2018-07-03 DIAGNOSIS — M6281 Muscle weakness (generalized): Secondary | ICD-10-CM | POA: Diagnosis not present

## 2018-07-03 DIAGNOSIS — R488 Other symbolic dysfunctions: Secondary | ICD-10-CM | POA: Diagnosis not present

## 2018-07-04 DIAGNOSIS — R488 Other symbolic dysfunctions: Secondary | ICD-10-CM | POA: Diagnosis not present

## 2018-07-04 DIAGNOSIS — M6281 Muscle weakness (generalized): Secondary | ICD-10-CM | POA: Diagnosis not present

## 2018-07-04 DIAGNOSIS — R262 Difficulty in walking, not elsewhere classified: Secondary | ICD-10-CM | POA: Diagnosis not present

## 2018-07-04 DIAGNOSIS — R2681 Unsteadiness on feet: Secondary | ICD-10-CM | POA: Diagnosis not present

## 2018-07-04 DIAGNOSIS — N3946 Mixed incontinence: Secondary | ICD-10-CM | POA: Diagnosis not present

## 2018-07-05 DIAGNOSIS — R262 Difficulty in walking, not elsewhere classified: Secondary | ICD-10-CM | POA: Diagnosis not present

## 2018-07-05 DIAGNOSIS — R488 Other symbolic dysfunctions: Secondary | ICD-10-CM | POA: Diagnosis not present

## 2018-07-05 DIAGNOSIS — M6281 Muscle weakness (generalized): Secondary | ICD-10-CM | POA: Diagnosis not present

## 2018-07-05 DIAGNOSIS — N3946 Mixed incontinence: Secondary | ICD-10-CM | POA: Diagnosis not present

## 2018-07-05 DIAGNOSIS — R2681 Unsteadiness on feet: Secondary | ICD-10-CM | POA: Diagnosis not present

## 2018-07-09 DIAGNOSIS — R488 Other symbolic dysfunctions: Secondary | ICD-10-CM | POA: Diagnosis not present

## 2018-07-09 DIAGNOSIS — D649 Anemia, unspecified: Secondary | ICD-10-CM | POA: Diagnosis not present

## 2018-07-09 DIAGNOSIS — N3946 Mixed incontinence: Secondary | ICD-10-CM | POA: Diagnosis not present

## 2018-07-09 DIAGNOSIS — E785 Hyperlipidemia, unspecified: Secondary | ICD-10-CM | POA: Diagnosis not present

## 2018-07-09 DIAGNOSIS — Z79899 Other long term (current) drug therapy: Secondary | ICD-10-CM | POA: Diagnosis not present

## 2018-07-09 DIAGNOSIS — R262 Difficulty in walking, not elsewhere classified: Secondary | ICD-10-CM | POA: Diagnosis not present

## 2018-07-09 DIAGNOSIS — R2681 Unsteadiness on feet: Secondary | ICD-10-CM | POA: Diagnosis not present

## 2018-07-09 DIAGNOSIS — M6281 Muscle weakness (generalized): Secondary | ICD-10-CM | POA: Diagnosis not present

## 2018-07-09 DIAGNOSIS — E039 Hypothyroidism, unspecified: Secondary | ICD-10-CM | POA: Diagnosis not present

## 2018-07-09 DIAGNOSIS — E559 Vitamin D deficiency, unspecified: Secondary | ICD-10-CM | POA: Diagnosis not present

## 2018-07-10 DIAGNOSIS — R2681 Unsteadiness on feet: Secondary | ICD-10-CM | POA: Diagnosis not present

## 2018-07-10 DIAGNOSIS — N3946 Mixed incontinence: Secondary | ICD-10-CM | POA: Diagnosis not present

## 2018-07-10 DIAGNOSIS — M6281 Muscle weakness (generalized): Secondary | ICD-10-CM | POA: Diagnosis not present

## 2018-07-10 DIAGNOSIS — R488 Other symbolic dysfunctions: Secondary | ICD-10-CM | POA: Diagnosis not present

## 2018-07-10 DIAGNOSIS — R262 Difficulty in walking, not elsewhere classified: Secondary | ICD-10-CM | POA: Diagnosis not present

## 2018-07-12 DIAGNOSIS — R488 Other symbolic dysfunctions: Secondary | ICD-10-CM | POA: Diagnosis not present

## 2018-07-12 DIAGNOSIS — R262 Difficulty in walking, not elsewhere classified: Secondary | ICD-10-CM | POA: Diagnosis not present

## 2018-07-12 DIAGNOSIS — R2681 Unsteadiness on feet: Secondary | ICD-10-CM | POA: Diagnosis not present

## 2018-07-12 DIAGNOSIS — N3946 Mixed incontinence: Secondary | ICD-10-CM | POA: Diagnosis not present

## 2018-07-12 DIAGNOSIS — M6281 Muscle weakness (generalized): Secondary | ICD-10-CM | POA: Diagnosis not present

## 2018-07-17 DIAGNOSIS — R2681 Unsteadiness on feet: Secondary | ICD-10-CM | POA: Diagnosis not present

## 2018-07-17 DIAGNOSIS — R488 Other symbolic dysfunctions: Secondary | ICD-10-CM | POA: Diagnosis not present

## 2018-07-17 DIAGNOSIS — N3946 Mixed incontinence: Secondary | ICD-10-CM | POA: Diagnosis not present

## 2018-07-17 DIAGNOSIS — R262 Difficulty in walking, not elsewhere classified: Secondary | ICD-10-CM | POA: Diagnosis not present

## 2018-07-17 DIAGNOSIS — M6281 Muscle weakness (generalized): Secondary | ICD-10-CM | POA: Diagnosis not present

## 2018-07-18 DIAGNOSIS — R488 Other symbolic dysfunctions: Secondary | ICD-10-CM | POA: Diagnosis not present

## 2018-07-18 DIAGNOSIS — M6281 Muscle weakness (generalized): Secondary | ICD-10-CM | POA: Diagnosis not present

## 2018-07-18 DIAGNOSIS — R262 Difficulty in walking, not elsewhere classified: Secondary | ICD-10-CM | POA: Diagnosis not present

## 2018-07-18 DIAGNOSIS — R2681 Unsteadiness on feet: Secondary | ICD-10-CM | POA: Diagnosis not present

## 2018-07-18 DIAGNOSIS — N3946 Mixed incontinence: Secondary | ICD-10-CM | POA: Diagnosis not present

## 2018-07-19 DIAGNOSIS — R2681 Unsteadiness on feet: Secondary | ICD-10-CM | POA: Diagnosis not present

## 2018-07-19 DIAGNOSIS — N3946 Mixed incontinence: Secondary | ICD-10-CM | POA: Diagnosis not present

## 2018-07-19 DIAGNOSIS — R488 Other symbolic dysfunctions: Secondary | ICD-10-CM | POA: Diagnosis not present

## 2018-07-19 DIAGNOSIS — M6281 Muscle weakness (generalized): Secondary | ICD-10-CM | POA: Diagnosis not present

## 2018-07-19 DIAGNOSIS — R262 Difficulty in walking, not elsewhere classified: Secondary | ICD-10-CM | POA: Diagnosis not present

## 2018-07-22 DIAGNOSIS — R488 Other symbolic dysfunctions: Secondary | ICD-10-CM | POA: Diagnosis not present

## 2018-07-22 DIAGNOSIS — N3946 Mixed incontinence: Secondary | ICD-10-CM | POA: Diagnosis not present

## 2018-07-22 DIAGNOSIS — M6281 Muscle weakness (generalized): Secondary | ICD-10-CM | POA: Diagnosis not present

## 2018-07-22 DIAGNOSIS — R2681 Unsteadiness on feet: Secondary | ICD-10-CM | POA: Diagnosis not present

## 2018-07-22 DIAGNOSIS — R262 Difficulty in walking, not elsewhere classified: Secondary | ICD-10-CM | POA: Diagnosis not present

## 2018-07-24 DIAGNOSIS — R2681 Unsteadiness on feet: Secondary | ICD-10-CM | POA: Diagnosis not present

## 2018-07-24 DIAGNOSIS — R262 Difficulty in walking, not elsewhere classified: Secondary | ICD-10-CM | POA: Diagnosis not present

## 2018-07-24 DIAGNOSIS — N3946 Mixed incontinence: Secondary | ICD-10-CM | POA: Diagnosis not present

## 2018-07-24 DIAGNOSIS — R488 Other symbolic dysfunctions: Secondary | ICD-10-CM | POA: Diagnosis not present

## 2018-07-24 DIAGNOSIS — M6281 Muscle weakness (generalized): Secondary | ICD-10-CM | POA: Diagnosis not present

## 2018-07-26 DIAGNOSIS — M6281 Muscle weakness (generalized): Secondary | ICD-10-CM | POA: Diagnosis not present

## 2018-07-26 DIAGNOSIS — R488 Other symbolic dysfunctions: Secondary | ICD-10-CM | POA: Diagnosis not present

## 2018-07-26 DIAGNOSIS — R262 Difficulty in walking, not elsewhere classified: Secondary | ICD-10-CM | POA: Diagnosis not present

## 2018-07-26 DIAGNOSIS — N3946 Mixed incontinence: Secondary | ICD-10-CM | POA: Diagnosis not present

## 2018-07-26 DIAGNOSIS — R2681 Unsteadiness on feet: Secondary | ICD-10-CM | POA: Diagnosis not present

## 2018-07-29 DIAGNOSIS — R2681 Unsteadiness on feet: Secondary | ICD-10-CM | POA: Diagnosis not present

## 2018-07-29 DIAGNOSIS — R262 Difficulty in walking, not elsewhere classified: Secondary | ICD-10-CM | POA: Diagnosis not present

## 2018-07-29 DIAGNOSIS — R488 Other symbolic dysfunctions: Secondary | ICD-10-CM | POA: Diagnosis not present

## 2018-07-29 DIAGNOSIS — N3946 Mixed incontinence: Secondary | ICD-10-CM | POA: Diagnosis not present

## 2018-07-29 DIAGNOSIS — M6281 Muscle weakness (generalized): Secondary | ICD-10-CM | POA: Diagnosis not present

## 2018-07-31 DIAGNOSIS — R1031 Right lower quadrant pain: Secondary | ICD-10-CM | POA: Diagnosis not present

## 2018-07-31 DIAGNOSIS — R600 Localized edema: Secondary | ICD-10-CM | POA: Diagnosis not present

## 2018-08-02 DIAGNOSIS — R600 Localized edema: Secondary | ICD-10-CM | POA: Diagnosis not present

## 2018-08-02 DIAGNOSIS — N3946 Mixed incontinence: Secondary | ICD-10-CM | POA: Diagnosis not present

## 2018-08-02 DIAGNOSIS — M6281 Muscle weakness (generalized): Secondary | ICD-10-CM | POA: Diagnosis not present

## 2018-08-02 DIAGNOSIS — R2681 Unsteadiness on feet: Secondary | ICD-10-CM | POA: Diagnosis not present

## 2018-08-02 DIAGNOSIS — L853 Xerosis cutis: Secondary | ICD-10-CM | POA: Diagnosis not present

## 2018-08-02 DIAGNOSIS — L603 Nail dystrophy: Secondary | ICD-10-CM | POA: Diagnosis not present

## 2018-08-02 DIAGNOSIS — I739 Peripheral vascular disease, unspecified: Secondary | ICD-10-CM | POA: Diagnosis not present

## 2018-08-02 DIAGNOSIS — R488 Other symbolic dysfunctions: Secondary | ICD-10-CM | POA: Diagnosis not present

## 2018-08-02 DIAGNOSIS — R262 Difficulty in walking, not elsewhere classified: Secondary | ICD-10-CM | POA: Diagnosis not present

## 2018-08-02 DIAGNOSIS — B351 Tinea unguium: Secondary | ICD-10-CM | POA: Diagnosis not present

## 2018-08-02 DIAGNOSIS — Q845 Enlarged and hypertrophic nails: Secondary | ICD-10-CM | POA: Diagnosis not present

## 2018-08-05 DIAGNOSIS — N3946 Mixed incontinence: Secondary | ICD-10-CM | POA: Diagnosis not present

## 2018-08-05 DIAGNOSIS — R488 Other symbolic dysfunctions: Secondary | ICD-10-CM | POA: Diagnosis not present

## 2018-08-05 DIAGNOSIS — M6281 Muscle weakness (generalized): Secondary | ICD-10-CM | POA: Diagnosis not present

## 2018-08-05 DIAGNOSIS — R2681 Unsteadiness on feet: Secondary | ICD-10-CM | POA: Diagnosis not present

## 2018-08-05 DIAGNOSIS — R262 Difficulty in walking, not elsewhere classified: Secondary | ICD-10-CM | POA: Diagnosis not present

## 2018-08-07 DIAGNOSIS — M6281 Muscle weakness (generalized): Secondary | ICD-10-CM | POA: Diagnosis not present

## 2018-08-07 DIAGNOSIS — R2681 Unsteadiness on feet: Secondary | ICD-10-CM | POA: Diagnosis not present

## 2018-08-07 DIAGNOSIS — R262 Difficulty in walking, not elsewhere classified: Secondary | ICD-10-CM | POA: Diagnosis not present

## 2018-08-08 ENCOUNTER — Encounter: Payer: Medicare HMO | Admitting: Psychology

## 2018-08-08 DIAGNOSIS — Z79899 Other long term (current) drug therapy: Secondary | ICD-10-CM | POA: Diagnosis not present

## 2018-08-09 ENCOUNTER — Ambulatory Visit (HOSPITAL_COMMUNITY): Payer: Medicare HMO | Admitting: Psychiatry

## 2018-08-09 DIAGNOSIS — M6281 Muscle weakness (generalized): Secondary | ICD-10-CM | POA: Diagnosis not present

## 2018-08-09 DIAGNOSIS — R2681 Unsteadiness on feet: Secondary | ICD-10-CM | POA: Diagnosis not present

## 2018-08-09 DIAGNOSIS — R262 Difficulty in walking, not elsewhere classified: Secondary | ICD-10-CM | POA: Diagnosis not present

## 2018-08-13 DIAGNOSIS — M6281 Muscle weakness (generalized): Secondary | ICD-10-CM | POA: Diagnosis not present

## 2018-08-13 DIAGNOSIS — E039 Hypothyroidism, unspecified: Secondary | ICD-10-CM | POA: Diagnosis not present

## 2018-08-13 DIAGNOSIS — D649 Anemia, unspecified: Secondary | ICD-10-CM | POA: Diagnosis not present

## 2018-08-13 DIAGNOSIS — R2681 Unsteadiness on feet: Secondary | ICD-10-CM | POA: Diagnosis not present

## 2018-08-13 DIAGNOSIS — I10 Essential (primary) hypertension: Secondary | ICD-10-CM | POA: Diagnosis not present

## 2018-08-13 DIAGNOSIS — R634 Abnormal weight loss: Secondary | ICD-10-CM | POA: Diagnosis not present

## 2018-08-13 DIAGNOSIS — R262 Difficulty in walking, not elsewhere classified: Secondary | ICD-10-CM | POA: Diagnosis not present

## 2018-08-14 DIAGNOSIS — R262 Difficulty in walking, not elsewhere classified: Secondary | ICD-10-CM | POA: Diagnosis not present

## 2018-08-14 DIAGNOSIS — M6281 Muscle weakness (generalized): Secondary | ICD-10-CM | POA: Diagnosis not present

## 2018-08-14 DIAGNOSIS — R2681 Unsteadiness on feet: Secondary | ICD-10-CM | POA: Diagnosis not present

## 2018-08-16 DIAGNOSIS — M6281 Muscle weakness (generalized): Secondary | ICD-10-CM | POA: Diagnosis not present

## 2018-08-16 DIAGNOSIS — R262 Difficulty in walking, not elsewhere classified: Secondary | ICD-10-CM | POA: Diagnosis not present

## 2018-08-16 DIAGNOSIS — R2681 Unsteadiness on feet: Secondary | ICD-10-CM | POA: Diagnosis not present

## 2018-08-19 DIAGNOSIS — M6281 Muscle weakness (generalized): Secondary | ICD-10-CM | POA: Diagnosis not present

## 2018-08-19 DIAGNOSIS — R262 Difficulty in walking, not elsewhere classified: Secondary | ICD-10-CM | POA: Diagnosis not present

## 2018-08-19 DIAGNOSIS — R2681 Unsteadiness on feet: Secondary | ICD-10-CM | POA: Diagnosis not present

## 2018-08-20 DIAGNOSIS — M6281 Muscle weakness (generalized): Secondary | ICD-10-CM | POA: Diagnosis not present

## 2018-08-20 DIAGNOSIS — R262 Difficulty in walking, not elsewhere classified: Secondary | ICD-10-CM | POA: Diagnosis not present

## 2018-08-20 DIAGNOSIS — R2681 Unsteadiness on feet: Secondary | ICD-10-CM | POA: Diagnosis not present

## 2018-08-22 DIAGNOSIS — M6281 Muscle weakness (generalized): Secondary | ICD-10-CM | POA: Diagnosis not present

## 2018-08-22 DIAGNOSIS — R2681 Unsteadiness on feet: Secondary | ICD-10-CM | POA: Diagnosis not present

## 2018-08-22 DIAGNOSIS — R262 Difficulty in walking, not elsewhere classified: Secondary | ICD-10-CM | POA: Diagnosis not present

## 2018-08-29 ENCOUNTER — Encounter: Payer: Medicare HMO | Admitting: Psychology

## 2018-09-18 DIAGNOSIS — R69 Illness, unspecified: Secondary | ICD-10-CM | POA: Diagnosis not present

## 2018-09-25 ENCOUNTER — Ambulatory Visit: Payer: Medicare HMO | Admitting: Neurology

## 2018-09-25 DIAGNOSIS — E039 Hypothyroidism, unspecified: Secondary | ICD-10-CM | POA: Diagnosis not present

## 2018-09-25 DIAGNOSIS — Z0000 Encounter for general adult medical examination without abnormal findings: Secondary | ICD-10-CM | POA: Diagnosis not present

## 2018-09-25 DIAGNOSIS — E7800 Pure hypercholesterolemia, unspecified: Secondary | ICD-10-CM | POA: Diagnosis not present

## 2018-09-25 DIAGNOSIS — I10 Essential (primary) hypertension: Secondary | ICD-10-CM | POA: Diagnosis not present

## 2018-09-25 DIAGNOSIS — M810 Age-related osteoporosis without current pathological fracture: Secondary | ICD-10-CM | POA: Diagnosis not present

## 2018-09-25 DIAGNOSIS — E538 Deficiency of other specified B group vitamins: Secondary | ICD-10-CM | POA: Diagnosis not present

## 2018-09-25 DIAGNOSIS — H9190 Unspecified hearing loss, unspecified ear: Secondary | ICD-10-CM | POA: Diagnosis not present

## 2018-09-25 DIAGNOSIS — R69 Illness, unspecified: Secondary | ICD-10-CM | POA: Diagnosis not present

## 2018-11-09 ENCOUNTER — Inpatient Hospital Stay (HOSPITAL_COMMUNITY)
Admission: EM | Admit: 2018-11-09 | Discharge: 2018-11-15 | DRG: 312 | Disposition: A | Discharge status: Home Health | Payer: Medicare HMO | Attending: Internal Medicine | Admitting: Internal Medicine

## 2018-11-09 ENCOUNTER — Other Ambulatory Visit: Payer: Self-pay

## 2018-11-09 ENCOUNTER — Emergency Department (HOSPITAL_COMMUNITY): Payer: Medicare HMO

## 2018-11-09 ENCOUNTER — Encounter (HOSPITAL_COMMUNITY): Payer: Self-pay | Admitting: Emergency Medicine

## 2018-11-09 DIAGNOSIS — R402142 Coma scale, eyes open, spontaneous, at arrival to emergency department: Secondary | ICD-10-CM | POA: Diagnosis present

## 2018-11-09 DIAGNOSIS — I714 Abdominal aortic aneurysm, without rupture, unspecified: Secondary | ICD-10-CM | POA: Diagnosis present

## 2018-11-09 DIAGNOSIS — I700 Atherosclerosis of aorta: Secondary | ICD-10-CM | POA: Diagnosis present

## 2018-11-09 DIAGNOSIS — N182 Chronic kidney disease, stage 2 (mild): Secondary | ICD-10-CM | POA: Diagnosis present

## 2018-11-09 DIAGNOSIS — R630 Anorexia: Secondary | ICD-10-CM | POA: Diagnosis present

## 2018-11-09 DIAGNOSIS — G9341 Metabolic encephalopathy: Secondary | ICD-10-CM | POA: Diagnosis not present

## 2018-11-09 DIAGNOSIS — K228 Other specified diseases of esophagus: Secondary | ICD-10-CM | POA: Diagnosis not present

## 2018-11-09 DIAGNOSIS — K224 Dyskinesia of esophagus: Secondary | ICD-10-CM | POA: Diagnosis present

## 2018-11-09 DIAGNOSIS — K5750 Diverticulosis of both small and large intestine without perforation or abscess without bleeding: Secondary | ICD-10-CM | POA: Diagnosis present

## 2018-11-09 DIAGNOSIS — Z79899 Other long term (current) drug therapy: Secondary | ICD-10-CM

## 2018-11-09 DIAGNOSIS — S3991XA Unspecified injury of abdomen, initial encounter: Secondary | ICD-10-CM | POA: Diagnosis not present

## 2018-11-09 DIAGNOSIS — R51 Headache: Secondary | ICD-10-CM | POA: Diagnosis not present

## 2018-11-09 DIAGNOSIS — I129 Hypertensive chronic kidney disease with stage 1 through stage 4 chronic kidney disease, or unspecified chronic kidney disease: Secondary | ICD-10-CM | POA: Diagnosis present

## 2018-11-09 DIAGNOSIS — R402252 Coma scale, best verbal response, oriented, at arrival to emergency department: Secondary | ICD-10-CM | POA: Diagnosis present

## 2018-11-09 DIAGNOSIS — I7389 Other specified peripheral vascular diseases: Secondary | ICD-10-CM | POA: Diagnosis present

## 2018-11-09 DIAGNOSIS — Z888 Allergy status to other drugs, medicaments and biological substances status: Secondary | ICD-10-CM

## 2018-11-09 DIAGNOSIS — K219 Gastro-esophageal reflux disease without esophagitis: Secondary | ICD-10-CM | POA: Diagnosis present

## 2018-11-09 DIAGNOSIS — R1310 Dysphagia, unspecified: Secondary | ICD-10-CM | POA: Diagnosis not present

## 2018-11-09 DIAGNOSIS — R0602 Shortness of breath: Secondary | ICD-10-CM

## 2018-11-09 DIAGNOSIS — F0150 Vascular dementia without behavioral disturbance: Secondary | ICD-10-CM | POA: Diagnosis not present

## 2018-11-09 DIAGNOSIS — E039 Hypothyroidism, unspecified: Secondary | ICD-10-CM | POA: Diagnosis not present

## 2018-11-09 DIAGNOSIS — S199XXA Unspecified injury of neck, initial encounter: Secondary | ICD-10-CM | POA: Diagnosis not present

## 2018-11-09 DIAGNOSIS — M810 Age-related osteoporosis without current pathological fracture: Secondary | ICD-10-CM | POA: Diagnosis present

## 2018-11-09 DIAGNOSIS — R404 Transient alteration of awareness: Secondary | ICD-10-CM | POA: Diagnosis not present

## 2018-11-09 DIAGNOSIS — H409 Unspecified glaucoma: Secondary | ICD-10-CM | POA: Diagnosis present

## 2018-11-09 DIAGNOSIS — Z90710 Acquired absence of both cervix and uterus: Secondary | ICD-10-CM

## 2018-11-09 DIAGNOSIS — J9811 Atelectasis: Secondary | ICD-10-CM | POA: Diagnosis not present

## 2018-11-09 DIAGNOSIS — R7989 Other specified abnormal findings of blood chemistry: Secondary | ICD-10-CM | POA: Diagnosis present

## 2018-11-09 DIAGNOSIS — Q396 Congenital diverticulum of esophagus: Secondary | ICD-10-CM | POA: Diagnosis not present

## 2018-11-09 DIAGNOSIS — F323 Major depressive disorder, single episode, severe with psychotic features: Secondary | ICD-10-CM | POA: Diagnosis present

## 2018-11-09 DIAGNOSIS — Z881 Allergy status to other antibiotic agents status: Secondary | ICD-10-CM

## 2018-11-09 DIAGNOSIS — I10 Essential (primary) hypertension: Secondary | ICD-10-CM | POA: Diagnosis present

## 2018-11-09 DIAGNOSIS — I452 Bifascicular block: Secondary | ICD-10-CM | POA: Diagnosis not present

## 2018-11-09 DIAGNOSIS — R531 Weakness: Secondary | ICD-10-CM | POA: Diagnosis not present

## 2018-11-09 DIAGNOSIS — Z6824 Body mass index (BMI) 24.0-24.9, adult: Secondary | ICD-10-CM | POA: Diagnosis not present

## 2018-11-09 DIAGNOSIS — Z9181 History of falling: Secondary | ICD-10-CM

## 2018-11-09 DIAGNOSIS — I951 Orthostatic hypotension: Secondary | ICD-10-CM | POA: Diagnosis not present

## 2018-11-09 DIAGNOSIS — Z8673 Personal history of transient ischemic attack (TIA), and cerebral infarction without residual deficits: Secondary | ICD-10-CM

## 2018-11-09 DIAGNOSIS — Z885 Allergy status to narcotic agent status: Secondary | ICD-10-CM

## 2018-11-09 DIAGNOSIS — M542 Cervicalgia: Secondary | ICD-10-CM | POA: Diagnosis not present

## 2018-11-09 DIAGNOSIS — K861 Other chronic pancreatitis: Secondary | ICD-10-CM | POA: Diagnosis not present

## 2018-11-09 DIAGNOSIS — G8929 Other chronic pain: Secondary | ICD-10-CM | POA: Diagnosis present

## 2018-11-09 DIAGNOSIS — L8990 Pressure ulcer of unspecified site, unspecified stage: Secondary | ICD-10-CM

## 2018-11-09 DIAGNOSIS — K8020 Calculus of gallbladder without cholecystitis without obstruction: Secondary | ICD-10-CM | POA: Diagnosis present

## 2018-11-09 DIAGNOSIS — Z8249 Family history of ischemic heart disease and other diseases of the circulatory system: Secondary | ICD-10-CM

## 2018-11-09 DIAGNOSIS — W19XXXA Unspecified fall, initial encounter: Secondary | ICD-10-CM | POA: Diagnosis not present

## 2018-11-09 DIAGNOSIS — R4182 Altered mental status, unspecified: Secondary | ICD-10-CM | POA: Diagnosis not present

## 2018-11-09 DIAGNOSIS — S0990XA Unspecified injury of head, initial encounter: Secondary | ICD-10-CM | POA: Diagnosis not present

## 2018-11-09 DIAGNOSIS — R402362 Coma scale, best motor response, obeys commands, at arrival to emergency department: Secondary | ICD-10-CM | POA: Diagnosis present

## 2018-11-09 DIAGNOSIS — F418 Other specified anxiety disorders: Secondary | ICD-10-CM | POA: Diagnosis present

## 2018-11-09 DIAGNOSIS — Z882 Allergy status to sulfonamides status: Secondary | ICD-10-CM

## 2018-11-09 DIAGNOSIS — R55 Syncope and collapse: Secondary | ICD-10-CM

## 2018-11-09 DIAGNOSIS — F0390 Unspecified dementia without behavioral disturbance: Secondary | ICD-10-CM | POA: Diagnosis present

## 2018-11-09 DIAGNOSIS — R627 Adult failure to thrive: Secondary | ICD-10-CM | POA: Diagnosis present

## 2018-11-09 DIAGNOSIS — E876 Hypokalemia: Secondary | ICD-10-CM | POA: Diagnosis present

## 2018-11-09 DIAGNOSIS — Z79890 Hormone replacement therapy (postmenopausal): Secondary | ICD-10-CM

## 2018-11-09 DIAGNOSIS — Z803 Family history of malignant neoplasm of breast: Secondary | ICD-10-CM

## 2018-11-09 DIAGNOSIS — L89309 Pressure ulcer of unspecified buttock, unspecified stage: Secondary | ICD-10-CM | POA: Diagnosis present

## 2018-11-09 DIAGNOSIS — J986 Disorders of diaphragm: Secondary | ICD-10-CM | POA: Diagnosis not present

## 2018-11-09 DIAGNOSIS — E7800 Pure hypercholesterolemia, unspecified: Secondary | ICD-10-CM | POA: Diagnosis present

## 2018-11-09 DIAGNOSIS — R69 Illness, unspecified: Secondary | ICD-10-CM | POA: Diagnosis not present

## 2018-11-09 LAB — CBC WITH DIFFERENTIAL/PLATELET
ABS IMMATURE GRANULOCYTES: 0.06 10*3/uL (ref 0.00–0.07)
BASOS ABS: 0 10*3/uL (ref 0.0–0.1)
BASOS PCT: 0 %
EOS ABS: 0.1 10*3/uL (ref 0.0–0.5)
Eosinophils Relative: 1 %
HEMATOCRIT: 36 % (ref 36.0–46.0)
HEMOGLOBIN: 11.3 g/dL — AB (ref 12.0–15.0)
Immature Granulocytes: 1 %
Lymphocytes Relative: 6 %
Lymphs Abs: 0.6 10*3/uL — ABNORMAL LOW (ref 0.7–4.0)
MCH: 30.5 pg (ref 26.0–34.0)
MCHC: 31.4 g/dL (ref 30.0–36.0)
MCV: 97.3 fL (ref 80.0–100.0)
MONOS PCT: 10 %
Monocytes Absolute: 1 10*3/uL (ref 0.1–1.0)
NRBC: 0 % (ref 0.0–0.2)
Neutro Abs: 8.7 10*3/uL — ABNORMAL HIGH (ref 1.7–7.7)
Neutrophils Relative %: 82 %
PLATELETS: 186 10*3/uL (ref 150–400)
RBC: 3.7 MIL/uL — ABNORMAL LOW (ref 3.87–5.11)
RDW: 13.2 % (ref 11.5–15.5)
WBC: 10.5 10*3/uL (ref 4.0–10.5)

## 2018-11-09 LAB — BASIC METABOLIC PANEL
ANION GAP: 10 (ref 5–15)
BUN: 32 mg/dL — ABNORMAL HIGH (ref 8–23)
CALCIUM: 9.2 mg/dL (ref 8.9–10.3)
CHLORIDE: 101 mmol/L (ref 98–111)
CO2: 29 mmol/L (ref 22–32)
Creatinine, Ser: 1.04 mg/dL — ABNORMAL HIGH (ref 0.44–1.00)
GFR calc Af Amer: 56 mL/min — ABNORMAL LOW (ref >60)
GFR calc non Af Amer: 48 mL/min — ABNORMAL LOW (ref >60)
GLUCOSE: 123 mg/dL — AB (ref 70–99)
Potassium: 3.4 mmol/L — ABNORMAL LOW (ref 3.5–5.1)
Sodium: 140 mmol/L (ref 135–145)

## 2018-11-09 LAB — I-STAT TROPONIN, ED: Troponin i, poc: 0.06 ng/mL (ref 0.00–0.08)

## 2018-11-09 LAB — MRSA PCR SCREENING: MRSA by PCR: NEGATIVE

## 2018-11-09 MED ORDER — SODIUM CHLORIDE 0.9 % IV SOLN
INTRAVENOUS | Status: DC
  Administered 2018-11-09: 250 mL via INTRAVENOUS

## 2018-11-09 MED ORDER — ONDANSETRON HCL 4 MG/2ML IJ SOLN: 4.0000 mg | Freq: Four times a day (QID) | INTRAMUSCULAR | Status: DC | PRN

## 2018-11-09 MED ORDER — SODIUM CHLORIDE 0.9 % IV SOLN
INTRAVENOUS | Status: DC
  Administered 2018-11-09: 22:00:00 via INTRAVENOUS

## 2018-11-09 MED ORDER — MEMANTINE HCL 10 MG PO TABS
10.0000 mg | ORAL_TABLET | Freq: Two times a day (BID) | ORAL | Status: DC
  Administered 2018-11-09 – 2018-11-15 (×12): 10 mg via ORAL
  Filled 2018-11-09 (×12): qty 1

## 2018-11-09 MED ORDER — LOSARTAN POTASSIUM 50 MG PO TABS
100.0000 mg | ORAL_TABLET | Freq: Every day | ORAL | Status: DC
  Administered 2018-11-10 – 2018-11-15 (×6): 100 mg via ORAL
  Filled 2018-11-09 (×6): qty 2

## 2018-11-09 MED ORDER — LORAZEPAM 1 MG PO TABS: 2.0000 mg | ORAL_TABLET | Freq: Every evening | ORAL | Status: DC | PRN

## 2018-11-09 MED ORDER — PAROXETINE HCL 10 MG PO TABS: 10.0000 mg | ORAL_TABLET | Freq: Every day | ORAL | Status: DC

## 2018-11-09 MED ORDER — LEVOTHYROXINE SODIUM 25 MCG PO TABS
25.0000 ug | ORAL_TABLET | Freq: Every day | ORAL | Status: DC
  Administered 2018-11-10 – 2018-11-12 (×2): 25 ug via ORAL
  Filled 2018-11-09 (×3): qty 1

## 2018-11-09 MED ORDER — ENOXAPARIN SODIUM 40 MG/0.4ML ~~LOC~~ SOLN
40.0000 mg | Freq: Every day | SUBCUTANEOUS | Status: DC
  Administered 2018-11-09 – 2018-11-14 (×5): 40 mg via SUBCUTANEOUS
  Filled 2018-11-09 (×6): qty 0.4

## 2018-11-09 MED ORDER — BUSPIRONE HCL 5 MG PO TABS
5.0000 mg | ORAL_TABLET | Freq: Two times a day (BID) | ORAL | Status: DC
  Administered 2018-11-09 – 2018-11-15 (×12): 5 mg via ORAL
  Filled 2018-11-09 (×12): qty 1

## 2018-11-09 MED ORDER — LORAZEPAM 0.5 MG PO TABS: 0.5000 mg | ORAL_TABLET | Freq: Three times a day (TID) | ORAL | Status: DC | PRN

## 2018-11-09 MED ORDER — POLYETHYLENE GLYCOL 3350 17 G PO PACK: 17.0000 g | PACK | Freq: Every day | ORAL | Status: DC | PRN

## 2018-11-09 MED ORDER — ACETAMINOPHEN 650 MG RE SUPP: 650.0000 mg | Freq: Four times a day (QID) | RECTAL | Status: DC | PRN

## 2018-11-09 MED ORDER — SERTRALINE HCL 50 MG PO TABS
50.0000 mg | ORAL_TABLET | Freq: Every day | ORAL | Status: DC
  Administered 2018-11-10 – 2018-11-15 (×6): 50 mg via ORAL
  Filled 2018-11-09 (×6): qty 1

## 2018-11-09 MED ORDER — SIMVASTATIN 10 MG PO TABS
5.0000 mg | ORAL_TABLET | Freq: Every day | ORAL | Status: DC
  Administered 2018-11-09 – 2018-11-15 (×6): 5 mg via ORAL
  Filled 2018-11-09 (×7): qty 1

## 2018-11-09 MED ORDER — HYDROCODONE-ACETAMINOPHEN 5-325 MG PO TABS
1.0000 | ORAL_TABLET | ORAL | Status: DC | PRN
  Administered 2018-11-11: 1 via ORAL
  Filled 2018-11-09: qty 1

## 2018-11-09 MED ORDER — ACETAMINOPHEN 325 MG PO TABS
650.0000 mg | ORAL_TABLET | Freq: Four times a day (QID) | ORAL | Status: DC | PRN
  Filled 2018-11-09: qty 2

## 2018-11-09 MED ORDER — IOPAMIDOL (ISOVUE-300) INJECTION 61%
100.0000 mL | Freq: Once | INTRAVENOUS | Status: AC | PRN
  Administered 2018-11-09: 100 mL via INTRAVENOUS

## 2018-11-09 MED ORDER — OLANZAPINE 5 MG PO TABS
7.5000 mg | ORAL_TABLET | Freq: Every day | ORAL | Status: DC
  Administered 2018-11-09 – 2018-11-14 (×6): 7.5 mg via ORAL
  Filled 2018-11-09 (×8): qty 1.5

## 2018-11-09 MED ORDER — POTASSIUM CHLORIDE CRYS ER 20 MEQ PO TBCR
40.0000 meq | EXTENDED_RELEASE_TABLET | Freq: Once | ORAL | Status: AC
  Administered 2018-11-09: 40 meq via ORAL
  Filled 2018-11-09: qty 2

## 2018-11-09 MED ORDER — SODIUM CHLORIDE (PF) 0.9 % IJ SOLN
INTRAMUSCULAR | Status: AC
  Filled 2018-11-09: qty 50

## 2018-11-09 MED ORDER — NETARSUDIL DIMESYLATE 0.02 % OP SOLN
1.0000 [drp] | Freq: Every day | OPHTHALMIC | Status: DC
  Administered 2018-11-11: 1 [drp] via OPHTHALMIC

## 2018-11-09 MED ORDER — CLONIDINE HCL 0.1 MG PO TABS: 0.1000 mg | ORAL_TABLET | Freq: Three times a day (TID) | ORAL | Status: DC

## 2018-11-09 MED ORDER — DORZOLAMIDE HCL-TIMOLOL MAL 2-0.5 % OP SOLN
1.0000 [drp] | Freq: Two times a day (BID) | OPHTHALMIC | Status: DC
  Administered 2018-11-09 – 2018-11-15 (×12): 1 [drp] via OPHTHALMIC
  Filled 2018-11-09: qty 10

## 2018-11-09 MED ORDER — ONDANSETRON HCL 4 MG PO TABS: 4.0000 mg | ORAL_TABLET | Freq: Four times a day (QID) | ORAL | Status: DC | PRN

## 2018-11-09 MED ORDER — SODIUM CHLORIDE 0.9% FLUSH
3.0000 mL | Freq: Two times a day (BID) | INTRAVENOUS | Status: DC
  Administered 2018-11-09 – 2018-11-15 (×8): 3 mL via INTRAVENOUS

## 2018-11-09 MED ORDER — LABETALOL HCL 100 MG PO TABS
100.0000 mg | ORAL_TABLET | Freq: Two times a day (BID) | ORAL | Status: DC
  Administered 2018-11-09 – 2018-11-15 (×12): 100 mg via ORAL
  Filled 2018-11-09 (×13): qty 1

## 2018-11-09 MED ORDER — IOPAMIDOL (ISOVUE-300) INJECTION 61%
INTRAVENOUS | Status: AC
  Filled 2018-11-09: qty 100

## 2018-11-09 NOTE — ED Notes (Signed)
ED TO INPATIENT HANDOFF REPORT  Name/Age/Gender Margaret Li 85 y.o. female  Code Status    Code Status Orders  (From admission, onward)         Start     Ordered   11/09/18 2041  Full code  Continuous     11/09/18 2042        Code Status History    Date Active Date Inactive Code Status Order ID Comments User Context   05/10/2018 1431 05/16/2018 1940 Full Code [id number]  Dorie Rank, MD ED   04/05/2018 1529 04/06/2018 1540 Full Code [id number]  Isla Pence, MD ED   06/14/2015 0038 06/18/2015 1504 Full Code [id number]  Lavina Hamman, MD ED      Home/SNF/Other Nursing Home  Chief Complaint Dementia   Level of Care/Admitting Diagnosis ED Disposition    ED Disposition Condition [facility]: [facility] [[id number]]  Level of Care: Telemetry [5]  Admit to tele based on following criteria: Eval of Syncope  Diagnosis: Syncope [206001]  Admitting Physician: Vianne Bulls [[id number]]  Attending Physician: Vianne Bulls [[id number]]  PT Class (Do Not Modify): Observation [104]  PT Acc Code (Do Not Modify): Observation [10022]       Medical History Past Medical History:  Diagnosis Date  . AAA (abdominal aortic aneurysm) ([facility])    infrarenal, 3.2 cm. seen on CTA Dec 2018  . Anxiety   . Aortic calcification ([facility])   . Black tarry stools    "per patient"  . Cholelithiasis   . Chronic low back pain   . Depression   . Diverticular disease 1999   h/o diverticular block   . GERD (gastroesophageal reflux disease)   . Glaucoma   . H/O hyperthyroidism   . Hypercholesterolemia   . Hypertension   . Hypothyroidism   . Migraine   . Osteoarthritis   . Osteoporosis   . Tubular adenoma    h/o  . Vitamin B 12 deficiency     Allergies Allergies  Allergen Reactions  . Biaxin [Clarithromycin]     GI side effects  . Boniva [Ibandronic Acid]     Groin pain  . Codeine Other (See Comments)    GI side effects  . Cymbalta [Duloxetine Hcl]     GI side effects  . Hydrochlorothiazide Other (See Comments)    Increased urination   . Lexapro [Escitalopram Oxalate] Nausea Only and Other (See Comments)    dizziness  . Norvasc [Amlodipine Besylate] Other (See Comments)    Abdominal pain, bad dreams  . Prozac [Fluoxetine Hcl] Other (See Comments)    Abnormal weight loss, GI upset   . Sulfa Antibiotics Other (See Comments)    Unknown reaction   . Trazodone Hcl Swelling and Rash    Swollen tongue     IV Location/Drains/Wounds Patient Lines/Drains/Airways Status   Active Line/Drains/Airways    Name:   Placement date:   Placement time:   Site:   Days:   Peripheral IV 11/09/18 Right Antecubital   11/09/18    1815    Antecubital   less than 1          Labs/Imaging Results for orders placed or performed during the hospital encounter of 11/09/18 (from the past 48 hour(s))  CBC with Differential/Platelet     Status: Abnormal   Collection Time: 11/09/18  6:17 PM  Result Value Ref Range   WBC 10.5 4.0 - 10.5 K/uL   RBC 3.70 (L)  3.87 - 5.11 MIL/uL   Hemoglobin 11.3 (L) 12.0 - 15.0 g/dL   HCT 36.0 36.0 - 46.0 %   MCV 97.3 80.0 - 100.0 fL   MCH 30.5 26.0 - 34.0 pg   MCHC 31.4 30.0 - 36.0 g/dL   RDW 13.2 11.5 - 15.5 %   Platelets 186 150 - 400 K/uL   nRBC 0.0 0.0 - 0.2 %   Neutrophils Relative % 82 %   Neutro Abs 8.7 (H) 1.7 - 7.7 K/uL   Lymphocytes Relative 6 %   Lymphs Abs 0.6 (L) 0.7 - 4.0 K/uL   Monocytes Relative 10 %   Monocytes Absolute 1.0 0.1 - 1.0 K/uL   Eosinophils Relative 1 %   Eosinophils Absolute 0.1 0.0 - 0.5 K/uL   Basophils Relative 0 %   Basophils Absolute 0.0 0.0 - 0.1 K/uL   Immature Granulocytes 1 %   Abs Immature Granulocytes 0.06 0.00 - 0.07 K/uL    Comment: Performed at [facility], [place] [address], [place], [place] [address]  Basic metabolic panel     Status: Abnormal   Collection Time: 11/09/18  6:17 PM  Result Value Ref Range   Sodium 140 135 - 145 mmol/L   Potassium 3.4  (L) 3.5 - 5.1 mmol/L   Chloride 101 98 - 111 mmol/L   CO2 29 22 - 32 mmol/L   Glucose, Bld 123 (H) 70 - 99 mg/dL   BUN 32 (H) 8 - 23 mg/dL   Creatinine, Ser 1.04 (H) 0.44 - 1.00 mg/dL   Calcium 9.2 8.9 - 10.3 mg/dL   GFR calc non Af Amer 48 (L) >60 mL/min   GFR calc Af Amer 56 (L) >60 mL/min   Anion gap 10 5 - 15    Comment: Performed at [facility], [place] [address] [place], [place] [address]  I-stat troponin, ED     Status: None   Collection Time: 11/09/18  6:21 PM  Result Value Ref Range   Troponin i, poc 0.06 0.00 - 0.08 ng/mL   Comment 3            Comment: Due to the release kinetics of cTnI, a negative result within the first hours of the onset of symptoms does not rule out myocardial infarction with certainty. If myocardial infarction is still suspected, repeat the test at appropriate intervals.    Dg Chest 2 View  Result Date: 11/09/2018 CLINICAL DATA:  Syncope. EXAM: CHEST - 2 VIEW COMPARISON:  Radiograph 05/13/2018. FINDINGS: Heart is normal in size. Atherosclerotic tortuous thoracic aorta. Minor bibasilar atelectasis. No confluent airspace disease. No pleural effusion or pneumothorax. No pulmonary edema. No acute osseous abnormalities are seen. IMPRESSION: 1. Minor bibasilar atelectasis. 2.  Aortic Atherosclerosis (ICD10-I70.0). Electronically Signed   By: Keith Rake M.D.   On: 11/09/2018 19:50   Ct Head Wo Contrast  Result Date: 11/09/2018 CLINICAL DATA:  Fall.  Complaining of headache and neck pain. EXAM: CT HEAD WITHOUT CONTRAST CT CERVICAL SPINE WITHOUT CONTRAST TECHNIQUE: Multidetector CT imaging of the head and cervical spine was performed following the standard protocol without intravenous contrast. Multiplanar CT image reconstructions of the cervical spine were also generated. COMPARISON:  03/04/2018 FINDINGS: CT HEAD FINDINGS Brain: No evidence of acute infarction, hemorrhage, hydrocephalus, extra-axial collection or mass lesion/mass effect.  There is age appropriate volume loss. Bilateral white matter hypoattenuation is noted consistent with moderate chronic microvascular ischemic change. Vascular: No hyperdense vessel or unexpected calcification. Skull: Normal. Negative for  fracture or focal lesion. Sinuses/Orbits: Globes and orbits are unremarkable. Visualized sinuses and mastoid air cells are clear. Other: None. CT CERVICAL SPINE FINDINGS Alignment: Normal. Skull base and vertebrae: No acute fracture. No primary bone lesion or focal pathologic process. Soft tissues and spinal canal: No prevertebral fluid or swelling. No visible canal hematoma. Disc levels: Mild loss of disc height at C2-C3 and C3-C4. Moderate loss of disc height at C4-C5 with marked loss of disc height at C5-C6 and C6-C7 spondylotic disc bulging with endplate spurring is noted at these levels. There are facet degenerative changes bilaterally. No convincing disc herniation. Upper chest: No mass or adenopathy. No acute findings. Lung apices are clear. Other: None. IMPRESSION: HEAD CT 1. No acute intracranial abnormalities. 2. Age-related volume loss. Moderate chronic microvascular ischemic change. CERVICAL CT 1. No fracture or acute finding Electronically Signed   By: Lajean Manes M.D.   On: 11/09/2018 19:38   Ct Cervical Spine Wo Contrast  Result Date: 11/09/2018 CLINICAL DATA:  Fall.  Complaining of headache and neck pain. EXAM: CT HEAD WITHOUT CONTRAST CT CERVICAL SPINE WITHOUT CONTRAST TECHNIQUE: Multidetector CT imaging of the head and cervical spine was performed following the standard protocol without intravenous contrast. Multiplanar CT image reconstructions of the cervical spine were also generated. COMPARISON:  03/04/2018 FINDINGS: CT HEAD FINDINGS Brain: No evidence of acute infarction, hemorrhage, hydrocephalus, extra-axial collection or mass lesion/mass effect. There is age appropriate volume loss. Bilateral white matter hypoattenuation is noted consistent with  moderate chronic microvascular ischemic change. Vascular: No hyperdense vessel or unexpected calcification. Skull: Normal. Negative for fracture or focal lesion. Sinuses/Orbits: Globes and orbits are unremarkable. Visualized sinuses and mastoid air cells are clear. Other: None. CT CERVICAL SPINE FINDINGS Alignment: Normal. Skull base and vertebrae: No acute fracture. No primary bone lesion or focal pathologic process. Soft tissues and spinal canal: No prevertebral fluid or swelling. No visible canal hematoma. Disc levels: Mild loss of disc height at C2-C3 and C3-C4. Moderate loss of disc height at C4-C5 with marked loss of disc height at C5-C6 and C6-C7 spondylotic disc bulging with endplate spurring is noted at these levels. There are facet degenerative changes bilaterally. No convincing disc herniation. Upper chest: No mass or adenopathy. No acute findings. Lung apices are clear. Other: None. IMPRESSION: HEAD CT 1. No acute intracranial abnormalities. 2. Age-related volume loss. Moderate chronic microvascular ischemic change. CERVICAL CT 1. No fracture or acute finding Electronically Signed   By: Lajean Manes M.D.   On: 11/09/2018 19:38   Ct Abdomen Pelvis W Contrast  Result Date: 11/09/2018 CLINICAL DATA:  Abdominal distension, history abdominal aortic aneurysm, fall, cholelithiasis, hypertension, GERD EXAM: CT ABDOMEN AND PELVIS WITH CONTRAST TECHNIQUE: Multidetector CT imaging of the abdomen and pelvis was performed using the standard protocol following bolus administration of intravenous contrast. Sagittal and coronal MPR images reconstructed from axial data set. CONTRAST:  186mL ISOVUE-300 IOPAMIDOL (ISOVUE-300) INJECTION 61% IV. No oral contrast. COMPARISON:  04/26/2017 FINDINGS: Lower chest: Bibasilar atelectasis Hepatobiliary: Dependent calculi in gallbladder. No definite gallbladder wall thickening or biliary dilatation. Liver unremarkable. Pancreas: Atrophic pancreas with multiple tiny parenchymal  calcifications consistent with chronic calcific pancreatitis. No definite pancreatic mass or ductal dilatation. Spleen: Normal appearance Adrenals/Urinary Tract: Thickening of adrenal glands without discrete mass. BILATERAL renal cysts, LEFT 3.0 x 2.6 cm image 24 and RIGHT 6.6 x 6.0 cm image 33. No additional renal mass or hydronephrosis. No urinary tract calcification. Bladder decompressed. Stomach/Bowel: Extensive diverticulosis of distal colon. Large duodenal diverticulum. Stomach  decompressed. Appendix not localized. Remaining bowel loops grossly normal appearance without dilatation, wall thickening or obstruction. Swirled appearance of the mesenteric vessels in the small bowel mesentery but no evidence of bowel obstruction or wall thickening to suggest a small bowel volvulus. Increased stool in rectum. Vascular/Lymphatic: Extensive atherosclerotic calcifications of aorta, iliac arteries, visceral arteries, coronary arteries. Tortuous and aneurysmal abdominal aorta measuring up to 3.5 x 3.4 cm distally, with aneurysmal dilatation extending into the common iliac arteries, proximal RIGHT common iliac artery 2.1 cm and proximal LEFT common iliac artery 1.6 cm. Narrowing of the celiac artery and SMA origins by calcified plaque. No adenopathy. Reproductive: Uterus surgically absent. Other: No free air or free fluid. BILATERAL breast prostheses noted. No definite inflammatory process. Musculoskeletal: Beam hardening artifacts in pelvis from RIGHT hip prosthesis. Diffuse osseous demineralization. Degenerative disc disease changes of the thoracolumbar spine. Chronic inferior endplate compression fracture of L1 unchanged. IMPRESSION: Extensive distal colonic diverticulosis without evidence of diverticulitis. Cholelithiasis. Abdominal aortic aneurysm 3.5 x 3.4 cm increased since 2018 when this measured a maximum of 3.0 x 3.0 cm. BILATERAL renal cysts. Chronic calcific pancreatitis. Electronically Signed   By: Lavonia Dana  M.D.   On: 11/09/2018 19:49   EKG Interpretation  Date/Time:  Saturday November 09 2018 18:07:15 EST Ventricular Rate:  79 PR Interval:    QRS Duration: 150 QT Interval:  432 QTC Calculation: 496 R Axis:   -78 Text Interpretation:  Sinus rhythm Probable left atrial enlargement RBBB and LAFB Left ventricular hypertrophy Confirmed by Lacretia Leigh ([id number]) on 11/09/2018 6:14:52 PM   Pending Labs Unresulted Labs (From admission, onward)    Start     Ordered   11/16/18 0500  Creatinine, serum  (enoxaparin (LOVENOX)    CrCl >/= 30 ml/min)  Weekly,   R    Comments:  while on enoxaparin therapy    11/09/18 2042   11/10/18 5409  Basic metabolic panel  Tomorrow morning,   R     11/09/18 2042          Vitals/Pain Today's Vitals   11/09/18 1737 11/09/18 1800 11/09/18 2013 11/09/18 2029  BP: (!) 140/97 (!) 126/94 113/80   Pulse: 77 80 84   Resp: 15 19 18    SpO2: 100% 100% 92%   Weight:    61.2 kg  Height:    5\' 6"  (1.676 m)  PainSc:        Isolation Precautions No active isolations  Medications Medications  iopamidol (ISOVUE-300) 61 % injection (has no administration in time range)  sodium chloride (PF) 0.9 % injection (has no administration in time range)  labetalol (NORMODYNE) tablet 100 mg (has no administration in time range)  cloNIDine (CATAPRES) tablet 0.1 mg (has no administration in time range)  simvastatin (ZOCOR) tablet 5 mg (has no administration in time range)  LORazepam (ATIVAN) tablet 0.5 mg (has no administration in time range)  LORazepam (ATIVAN) tablet 2 mg (has no administration in time range)  PARoxetine (PAXIL) tablet 10 mg (has no administration in time range)  levothyroxine (SYNTHROID, LEVOTHROID) tablet 25 mcg (has no administration in time range)  losartan (COZAAR) tablet 100 mg (has no administration in time range)  dorzolamide-timolol (COSOPT) 22.3-6.8 MG/ML ophthalmic solution 1 drop (has no administration in time range)  Netarsudil Dimesylate 0.02  % SOLN 1 drop (has no administration in time range)  sodium chloride flush (NS) 0.9 % injection 3 mL (has no administration in time range)  enoxaparin (LOVENOX) injection 40 mg (has no administration  in time range)  0.9 %  sodium chloride infusion (has no administration in time range)  acetaminophen (TYLENOL) tablet 650 mg (has no administration in time range)    Or  acetaminophen (TYLENOL) suppository 650 mg (has no administration in time range)  HYDROcodone-acetaminophen (NORCO/VICODIN) 5-325 MG per tablet 1-2 tablet (has no administration in time range)  polyethylene glycol (MIRALAX / GLYCOLAX) packet 17 g (has no administration in time range)  ondansetron (ZOFRAN) tablet 4 mg (has no administration in time range)    Or  ondansetron (ZOFRAN) injection 4 mg (has no administration in time range)  potassium chloride SA (K-DUR,KLOR-CON) CR tablet 40 mEq (has no administration in time range)  iopamidol (ISOVUE-300) 61 % injection 100 mL (100 mLs Intravenous Contrast Given 11/09/18 1918)    Mobility walks

## 2018-11-09 NOTE — ED Notes (Signed)
Bed: WA17 Expected date:  Expected time:  Means of arrival:  Comments: EMS syncope from [facility] home

## 2018-11-09 NOTE — ED Notes (Signed)
Patient transported to CT 

## 2018-11-09 NOTE — H&P (Addendum)
History and Physical    Margaret Li XBD:[id number] DOB: 03/14/1929 DOA: 11/09/2018  PCP: Darcus Austin, MD   Patient coming from: SNF   Chief Complaint: Syncope   HPI: Margaret Li is a 85 y.o. female with medical history significant for hypertension, history of CVA, hypothyroidism, depression with anxiety, and AAA, now presenting to the emergency department from her SNF for evaluation of a syncopal episode.  Patient reports that she was pushing her walker to the lunchroom today when she became acutely lightheaded and had a transient loss of consciousness, falling to the ground.  She is not sure if she hit her head but reports some mild ache in her left jaw.  Patient reports that she is had a poor appetite for approximately 2 days, but no nausea, vomiting, abdominal pain, chest pain, palpitations, cough, shortness of breath, fevers, or chills.  Denies dysuria or flank pain.  No change in vision or hearing or focal numbness or weakness.  ED Course: Upon arrival to the ED, patient is found to be saturating low 90s on room air, and with vitals otherwise normal.  EKG features a sinus rhythm with RBBB, LAFB, LVH, similar to prior.  Chest x-ray features minor bibasilar atelectasis.  Chemistry panel with slight hypokalemia and creatinine 1.04, slightly up from priors.  CBC unremarkable and troponin normal.  There was a 24 mmHg drop in systolic blood pressure upon standing in the ED.  Patient remains hemodynamically stable and will be observed for further evaluation and management of syncope, likely due to orthostasis.  Review of Systems:  All other systems reviewed and apart from HPI, are negative.  Past Medical History:  Diagnosis Date  . AAA (abdominal aortic aneurysm) ([place])    infrarenal, 3.2 cm. seen on CTA Dec 2018  . Anxiety   . Aortic calcification ([facility])   . Black tarry stools    "per patient"  . Cholelithiasis   . Chronic low back pain   . Depression   . Diverticular disease 1999   h/o  diverticular block   . GERD (gastroesophageal reflux disease)   . Glaucoma   . H/O hyperthyroidism   . Hypercholesterolemia   . Hypertension   . Hypothyroidism   . Migraine   . Osteoarthritis   . Osteoporosis   . Tubular adenoma    h/o  . Vitamin B 12 deficiency     Past Surgical History:  Procedure Laterality Date  . ABDOMINAL HYSTERECTOMY     80's  . BREAST ENHANCEMENT SURGERY    . CATARACT EXTRACTION, BILATERAL    . ESOPHAGOGASTRODUODENOSCOPY (EGD) WITH PROPOFOL N/A 08/04/2016   Procedure: ESOPHAGOGASTRODUODENOSCOPY (EGD) WITH PROPOFOL;  Surgeon: Carol Ada, MD;  Location: [facility];  Service: Endoscopy;  Laterality: N/A;  . HERNIA REPAIR Right    Inguinal hernia repair '12 -Dr. Rise Patience  . JOINT REPLACEMENT Right    -RTHA -4 '11 - Dr. [name]- [facility]     reports that she quit smoking about 7 years ago. Her smoking use included cigarettes. She has never used smokeless tobacco. She reports that she does not drink alcohol or use drugs.  Allergies  Allergen Reactions  . Biaxin [Clarithromycin]     GI side effects  . Boniva [Ibandronic Acid]     Groin pain  . Codeine Other (See Comments)    GI side effects  . Cymbalta [Duloxetine Hcl]     GI side effects  . Hydrochlorothiazide Other (See Comments)    Increased urination   .  Lexapro [Escitalopram Oxalate] Nausea Only and Other (See Comments)    dizziness  . Norvasc [Amlodipine Besylate] Other (See Comments)    Abdominal pain, bad dreams  . Prozac [Fluoxetine Hcl] Other (See Comments)    Abnormal weight loss, GI upset   . Sulfa Antibiotics Other (See Comments)    Unknown reaction   . Trazodone Hcl Swelling and Rash    Swollen tongue     Family History  Problem Relation Age of Onset  . Breast cancer Sister        half sister  . Heart disease Brother        half brother     Prior to Admission medications   Medication Sig Start Date End Date Taking? Authorizing Provider  acetaminophen (TYLENOL) 500 MG  tablet Take 500 mg by mouth every 6 (six) hours as needed for mild pain or headache.    [provider]  calcium carbonate (OS-CAL) 600 MG tablet Take 600 mg by mouth daily.    [provider]  Cholecalciferol (VITAMIN D-3) 1000 units CAPS Take 1,000 Units by mouth daily.     [provider]  cloNIDine (CATAPRES) 0.1 MG tablet Take 0.1 mg by mouth every 8 (eight) hours. 05/03/18   [provider]  dorzolamide-timolol (COSOPT) 22.3-6.8 MG/ML ophthalmic solution Place 1 drop into both eyes 2 (two) times daily.    [provider]  labetalol (NORMODYNE) 100 MG tablet Take 100 mg by mouth 2 (two) times daily.     [provider]  levothyroxine (SYNTHROID, LEVOTHROID) 25 MCG tablet Take 25 mcg by mouth daily before breakfast.    [provider]  LORazepam (ATIVAN) 0.5 MG tablet Take 1 tablet (0.5 mg total) by mouth every 8 (eight) hours as needed for anxiety. Patient not taking: Reported on 04/05/2018 12/02/17   Tegeler, Gwenyth Allegra, MD  LORazepam (ATIVAN) 2 MG tablet Take 2 mg by mouth at bedtime.  04/27/15   [provider]  losartan (COZAAR) 50 MG tablet Take 100 mg by mouth daily.  06/01/15   [provider]  Netarsudil Dimesylate 0.02 % SOLN Place 1 drop into both eyes at bedtime. "Rhopressa" 09/07/17   [provider]  PARoxetine (PAXIL) 10 MG tablet Take 10 mg by mouth daily. 05/03/18   [provider]  simvastatin (ZOCOR) 10 MG tablet Take 5 mg by mouth every evening.  05/21/15   [provider]  vitamin C (ASCORBIC ACID) 500 MG tablet Take 500 mg by mouth daily.    [provider]    Physical Exam: Vitals:   11/09/18 1737 11/09/18 1800 11/09/18 2013 11/09/18 2029  BP: (!) 140/97 (!) 126/94 113/80   Pulse: 77 80 84   Resp: 15 19 18    SpO2: 100% 100% 92%   Weight:    61.2 kg  Height:    5\' 6"  (1.676 m)    Constitutional: NAD, calm  Eyes: PERTLA, lids and conjunctivae  normal ENMT: Mucous membranes are moist. Posterior pharynx clear of any exudate or lesions.   Neck: normal, supple, no masses, no thyromegaly Respiratory: clear to auscultation bilaterally, no wheezing, no crackles. Normal respiratory effort.    Cardiovascular: S1 & S2 heard, regular rate and rhythm. Trace pedal edema bilaterally. Abdomen: No distension, no tenderness, soft. Bowel sounds active.  Musculoskeletal: no clubbing / cyanosis. No joint deformity upper and lower extremities.    Skin: no significant rashes, lesions, ulcers. Poor turgor. Neurologic: no facial asymmetry. Sensation intact. Moving all  extremities.  Psychiatric: Alert and oriented to person, place, and situation. Calm, cooperative.    Labs on Admission: I have personally reviewed following labs and imaging studies  CBC: Recent Labs  Lab 11/09/18 1817  WBC 10.5  NEUTROABS 8.7*  HGB 11.3*  HCT 36.0  MCV 97.3  PLT 585   Basic Metabolic Panel: Recent Labs  Lab 11/09/18 1817  NA 140  K 3.4*  CL 101  CO2 29  GLUCOSE 123*  BUN 32*  CREATININE 1.04*  CALCIUM 9.2   GFR: Estimated Creatinine Clearance: 35.7 mL/min (A) (by C-G formula based on SCr of 1.04 mg/dL (H)). Liver Function Tests: No results for input(s): AST, ALT, ALKPHOS, BILITOT, PROT, ALBUMIN in the last 168 hours. No results for input(s): LIPASE, AMYLASE in the last 168 hours. No results for input(s): AMMONIA in the last 168 hours. Coagulation Profile: No results for input(s): INR, PROTIME in the last 168 hours. Cardiac Enzymes: No results for input(s): CKTOTAL, CKMB, CKMBINDEX, TROPONINI in the last 168 hours. BNP (last 3 results) No results for input(s): PROBNP in the last 8760 hours. HbA1C: No results for input(s): HGBA1C in the last 72 hours. CBG: No results for input(s): GLUCAP in the last 168 hours. Lipid Profile: No results for input(s): CHOL, HDL, LDLCALC, TRIG, CHOLHDL, LDLDIRECT in the last 72 hours. Thyroid Function Tests: No  results for input(s): TSH, T4TOTAL, FREET4, T3FREE, THYROIDAB in the last 72 hours. Anemia Panel: No results for input(s): VITAMINB12, FOLATE, FERRITIN, TIBC, IRON, RETICCTPCT in the last 72 hours. Urine analysis:    Component Value Date/Time   COLORURINE YELLOW 05/10/2018 [place] 05/10/2018 1514   LABSPEC 1.024 05/10/2018 1514   PHURINE 5.0 05/10/2018 [place] 05/10/2018 1514   HGBUR NEGATIVE 05/10/2018 1514   BILIRUBINUR NEGATIVE 05/10/2018 1514   KETONESUR NEGATIVE 05/10/2018 1514   PROTEINUR NEGATIVE 05/10/2018 1514   UROBILINOGEN 1.0 02/16/2010 1215   NITRITE NEGATIVE 05/10/2018 1514   LEUKOCYTESUR MODERATE (A) 05/10/2018 1514   Sepsis Labs: @LABRCNTIP (procalcitonin:4,lacticidven:4) )No results found for this or any previous visit (from the past 240 hour(s)).   Radiological Exams on Admission: Dg Chest 2 View  Result Date: 11/09/2018 CLINICAL DATA:  Syncope. EXAM: CHEST - 2 VIEW COMPARISON:  Radiograph 05/13/2018. FINDINGS: Heart is normal in size. Atherosclerotic tortuous thoracic aorta. Minor bibasilar atelectasis. No confluent airspace disease. No pleural effusion or pneumothorax. No pulmonary edema. No acute osseous abnormalities are seen. IMPRESSION: 1. Minor bibasilar atelectasis. 2.  Aortic Atherosclerosis (ICD10-I70.0). Electronically Signed   By: Keith Rake M.D.   On: 11/09/2018 19:50   Ct Head Wo Contrast  Result Date: 11/09/2018 CLINICAL DATA:  Fall.  Complaining of headache and neck pain. EXAM: CT HEAD WITHOUT CONTRAST CT CERVICAL SPINE WITHOUT CONTRAST TECHNIQUE: Multidetector CT imaging of the head and cervical spine was performed following the standard protocol without intravenous contrast. Multiplanar CT image reconstructions of the cervical spine were also generated. COMPARISON:  03/04/2018 FINDINGS: CT HEAD FINDINGS Brain: No evidence of acute infarction, hemorrhage, hydrocephalus, extra-axial collection or mass lesion/mass  effect. There is age appropriate volume loss. Bilateral white matter hypoattenuation is noted consistent with moderate chronic microvascular ischemic change. Vascular: No hyperdense vessel or unexpected calcification. Skull: Normal. Negative for fracture or focal lesion. Sinuses/Orbits: Globes and orbits are unremarkable. Visualized sinuses and mastoid air cells are clear. Other: None. CT CERVICAL SPINE FINDINGS Alignment: Normal. Skull base and vertebrae: No acute fracture. No primary bone lesion or focal pathologic process. Soft  tissues and spinal canal: No prevertebral fluid or swelling. No visible canal hematoma. Disc levels: Mild loss of disc height at C2-C3 and C3-C4. Moderate loss of disc height at C4-C5 with marked loss of disc height at C5-C6 and C6-C7 spondylotic disc bulging with endplate spurring is noted at these levels. There are facet degenerative changes bilaterally. No convincing disc herniation. Upper chest: No mass or adenopathy. No acute findings. Lung apices are clear. Other: None. IMPRESSION: HEAD CT 1. No acute intracranial abnormalities. 2. Age-related volume loss. Moderate chronic microvascular ischemic change. CERVICAL CT 1. No fracture or acute finding Electronically Signed   By: Lajean Manes M.D.   On: 11/09/2018 19:38   Ct Cervical Spine Wo Contrast  Result Date: 11/09/2018 CLINICAL DATA:  Fall.  Complaining of headache and neck pain. EXAM: CT HEAD WITHOUT CONTRAST CT CERVICAL SPINE WITHOUT CONTRAST TECHNIQUE: Multidetector CT imaging of the head and cervical spine was performed following the standard protocol without intravenous contrast. Multiplanar CT image reconstructions of the cervical spine were also generated. COMPARISON:  03/04/2018 FINDINGS: CT HEAD FINDINGS Brain: No evidence of acute infarction, hemorrhage, hydrocephalus, extra-axial collection or mass lesion/mass effect. There is age appropriate volume loss. Bilateral white matter hypoattenuation is noted consistent with  moderate chronic microvascular ischemic change. Vascular: No hyperdense vessel or unexpected calcification. Skull: Normal. Negative for fracture or focal lesion. Sinuses/Orbits: Globes and orbits are unremarkable. Visualized sinuses and mastoid air cells are clear. Other: None. CT CERVICAL SPINE FINDINGS Alignment: Normal. Skull base and vertebrae: No acute fracture. No primary bone lesion or focal pathologic process. Soft tissues and spinal canal: No prevertebral fluid or swelling. No visible canal hematoma. Disc levels: Mild loss of disc height at C2-C3 and C3-C4. Moderate loss of disc height at C4-C5 with marked loss of disc height at C5-C6 and C6-C7 spondylotic disc bulging with endplate spurring is noted at these levels. There are facet degenerative changes bilaterally. No convincing disc herniation. Upper chest: No mass or adenopathy. No acute findings. Lung apices are clear. Other: None. IMPRESSION: HEAD CT 1. No acute intracranial abnormalities. 2. Age-related volume loss. Moderate chronic microvascular ischemic change. CERVICAL CT 1. No fracture or acute finding Electronically Signed   By: Lajean Manes M.D.   On: 11/09/2018 19:38   Ct Abdomen Pelvis W Contrast  Result Date: 11/09/2018 CLINICAL DATA:  Abdominal distension, history abdominal aortic aneurysm, fall, cholelithiasis, hypertension, GERD EXAM: CT ABDOMEN AND PELVIS WITH CONTRAST TECHNIQUE: Multidetector CT imaging of the abdomen and pelvis was performed using the standard protocol following bolus administration of intravenous contrast. Sagittal and coronal MPR images reconstructed from axial data set. CONTRAST:  1106mL ISOVUE-300 IOPAMIDOL (ISOVUE-300) INJECTION 61% IV. No oral contrast. COMPARISON:  04/26/2017 FINDINGS: Lower chest: Bibasilar atelectasis Hepatobiliary: Dependent calculi in gallbladder. No definite gallbladder wall thickening or biliary dilatation. Liver unremarkable. Pancreas: Atrophic pancreas with multiple tiny parenchymal  calcifications consistent with chronic calcific pancreatitis. No definite pancreatic mass or ductal dilatation. Spleen: Normal appearance Adrenals/Urinary Tract: Thickening of adrenal glands without discrete mass. BILATERAL renal cysts, LEFT 3.0 x 2.6 cm image 24 and RIGHT 6.6 x 6.0 cm image 33. No additional renal mass or hydronephrosis. No urinary tract calcification. Bladder decompressed. Stomach/Bowel: Extensive diverticulosis of distal colon. Large duodenal diverticulum. Stomach decompressed. Appendix not localized. Remaining bowel loops grossly normal appearance without dilatation, wall thickening or obstruction. Swirled appearance of the mesenteric vessels in the small bowel mesentery but no evidence of bowel obstruction or wall thickening to suggest a small bowel volvulus.  Increased stool in rectum. Vascular/Lymphatic: Extensive atherosclerotic calcifications of aorta, iliac arteries, visceral arteries, coronary arteries. Tortuous and aneurysmal abdominal aorta measuring up to 3.5 x 3.4 cm distally, with aneurysmal dilatation extending into the common iliac arteries, proximal RIGHT common iliac artery 2.1 cm and proximal LEFT common iliac artery 1.6 cm. Narrowing of the celiac artery and SMA origins by calcified plaque. No adenopathy. Reproductive: Uterus surgically absent. Other: No free air or free fluid. BILATERAL breast prostheses noted. No definite inflammatory process. Musculoskeletal: Beam hardening artifacts in pelvis from RIGHT hip prosthesis. Diffuse osseous demineralization. Degenerative disc disease changes of the thoracolumbar spine. Chronic inferior endplate compression fracture of L1 unchanged. IMPRESSION: Extensive distal colonic diverticulosis without evidence of diverticulitis. Cholelithiasis. Abdominal aortic aneurysm 3.5 x 3.4 cm increased since 2018 when this measured a maximum of 3.0 x 3.0 cm. BILATERAL renal cysts. Chronic calcific pancreatitis. Electronically Signed   By: Lavonia Dana  M.D.   On: 11/09/2018 19:49    EKG: Independently reviewed. Sinus rhythm, RBBB, LAFB, LVH, similar to prior.   Assessment/Plan   1. Syncope  - Presents from SNF after a syncopal episode  - EKG with RBBB, LAFB, and likely LVH  - 24 mmHg drop in SBP on standing in ED  - Most likely secondary to orthostasis  - Continue cardiac monitoring, hydrate with NS infusion overnight, hold HCTZ, check echocardiogram given abnormal EKG, and repeat orthostatic vitals in am   2. AAA  - Increased to 3.4 x 3.5 cm on CT in ED  - Outpatient follow-up recommended   3. History of CVA  - Hx of occipital CVA  - No acute findings on head CT, and no focal deficit on exam  - She was instructed to start baby ASA after CVA, not on med list  - Continue statin, consider ASA unless there is a contraindication   4. Hypertension  - BP at goal  - Continue losartan and labetalol, hold HCTZ while hydrating  5. Mild renal insufficiency  - SCr is 1.05 on admission, just slightly higher than priors  - She is being hydrated in light of syncope suspected secondary to orthostasis  - Repeat chem panel in am    6. Hypothyroidism  - Continue Synthroid    7. Dementia; depression with anxiety  - Continue Namenda, Zoloft, Buspar, Zyprexa     DVT prophylaxis: Lovenox  Code Status: Full  Family Communication: Discussed with patient  Consults called: None Admission status: Observation     Vianne Bulls, MD [facility] Pager [phone]  If 7PM-7AM, please contact night-coverage [email or web address] Password [facility]  11/09/2018, 8:42 PM

## 2018-11-09 NOTE — ED Provider Notes (Signed)
[facility] DEPT Provider Note   CSN: [id number] Arrival date & time: 11/09/18  1713     History   Chief Complaint Chief Complaint  Patient presents with  . Loss of Consciousness    HPI Margaret Li is a 85 y.o. female.  85 year old female with history of dementia presents after having a syncopal event just prior to arrival.  Patient states she was walking the halls all of a sudden felt funny and passed out.  She does have a history of AAA but denies any abdominal discomfort.  Denied any headache, chest discomfort, abdominal comfort prior to the event.  No recent history of illnesses.  Although she does have history of dementia she is alert and oriented x4.  According to EMS, the staff states that the patient is at her neurological baseline.  Patient transported here for further management     Past Medical History:  Diagnosis Date  . AAA (abdominal aortic aneurysm) ([place])    infrarenal, 3.2 cm. seen on CTA Dec 2018  . Anxiety   . Aortic calcification (HCC)   . Black tarry stools    "per patient"  . Cholelithiasis   . Chronic low back pain   . Depression   . Diverticular disease 1999   h/o diverticular block   . GERD (gastroesophageal reflux disease)   . Glaucoma   . H/O hyperthyroidism   . Hypercholesterolemia   . Hypertension   . Hypothyroidism   . Migraine   . Osteoarthritis   . Osteoporosis   . Tubular adenoma    h/o  . Vitamin B 12 deficiency     Patient Active Problem List   Diagnosis Date Noted  . Major depressive disorder with psychotic features ([place]) 05/11/2018  . Confusion   . Vision loss, left eye 02/17/2017  . Visual hallucinations 02/17/2017  . Neuropathy 02/17/2017  . Acute blood loss anemia 06/14/2015  . Hypertension 06/14/2015  . Hypothyroidism 06/14/2015  . Glaucoma 06/14/2015  . Rectal bleeding 06/13/2015    Past Surgical History:  Procedure Laterality Date  . ABDOMINAL HYSTERECTOMY     80's  . BREAST  ENHANCEMENT SURGERY    . CATARACT EXTRACTION, BILATERAL    . ESOPHAGOGASTRODUODENOSCOPY (EGD) WITH PROPOFOL N/A 08/04/2016   Procedure: ESOPHAGOGASTRODUODENOSCOPY (EGD) WITH PROPOFOL;  Surgeon: Carol Ada, MD;  Location: [facility];  Service: Endoscopy;  Laterality: N/A;  . HERNIA REPAIR Right    Inguinal hernia repair '12 -Dr. Rise Patience  . JOINT REPLACEMENT Right    -RTHA -4 '11 - Dr. [name]- [facility]     OB History   No obstetric history on file.      Home Medications    Prior to Admission medications   Medication Sig Start Date End Date Taking? Authorizing Provider  acetaminophen (TYLENOL) 500 MG tablet Take 500 mg by mouth every 6 (six) hours as needed for mild pain or headache.    [provider]  calcium carbonate (OS-CAL) 600 MG tablet Take 600 mg by mouth daily.    [provider]  Cholecalciferol (VITAMIN D-3) 1000 units CAPS Take 1,000 Units by mouth daily.     [provider]  cloNIDine (CATAPRES) 0.1 MG tablet Take 0.1 mg by mouth every 8 (eight) hours. 05/03/18   [provider]  dorzolamide-timolol (COSOPT) 22.3-6.8 MG/ML ophthalmic solution Place 1 drop into both eyes 2 (two) times daily.    [provider]  labetalol (NORMODYNE) 100 MG tablet Take 100 mg by mouth 2 (  two) times daily.     [provider]  levothyroxine (SYNTHROID, LEVOTHROID) 25 MCG tablet Take 25 mcg by mouth daily before breakfast.    [provider]  LORazepam (ATIVAN) 0.5 MG tablet Take 1 tablet (0.5 mg total) by mouth every 8 (eight) hours as needed for anxiety. Patient not taking: Reported on 04/05/2018 12/02/17   Tegeler, Gwenyth Allegra, MD  LORazepam (ATIVAN) 2 MG tablet Take 2 mg by mouth at bedtime.  04/27/15   [provider]  losartan (COZAAR) 50 MG tablet Take 100 mg by mouth daily.  06/01/15   [provider]  Netarsudil Dimesylate 0.02 % SOLN Place 1 drop into both eyes at bedtime. "Rhopressa" 09/07/17   [provider]  PARoxetine (PAXIL) 10 MG tablet Take 10 mg by mouth daily. 05/03/18   [provider]  simvastatin (ZOCOR) 10 MG tablet Take 5 mg by mouth every evening.  05/21/15   [provider]  vitamin C (ASCORBIC ACID) 500 MG tablet Take 500 mg by mouth daily.    [provider]    Family History Family History  Problem Relation Age of Onset  . Breast cancer Sister        half sister  . Heart disease Brother        half brother    Social History Social History   Tobacco Use  . Smoking status: Former Smoker    Types: Cigarettes    Last attempt to quit: 08/01/2011    Years since quitting: 7.2  . Smokeless tobacco: Never Used  Substance Use Topics  . Alcohol use: No  . Drug use: No     Allergies   Biaxin [clarithromycin]; Boniva [ibandronic acid]; Codeine; Cymbalta [duloxetine hcl]; Hydrochlorothiazide; Lexapro [escitalopram oxalate]; Norvasc [amlodipine besylate]; Prozac [fluoxetine hcl]; Sulfa antibiotics; and Trazodone hcl   Review of Systems Review of Systems  All other systems reviewed and are negative.    Physical Exam Updated Vital Signs BP (!) 140/97   Pulse 77   Resp 15   SpO2 100%   Physical Exam Vitals signs and nursing note reviewed.  Constitutional:      General: She is not in acute distress.    Appearance: Normal appearance. She is well-developed. She is not toxic-appearing.  HENT:     Head: Normocephalic and atraumatic.  Eyes:     General: Lids are normal.     Conjunctiva/sclera: Conjunctivae normal.     Pupils: Pupils are equal, round, and reactive to light.  Neck:     Musculoskeletal: Normal range of motion and neck supple.     Thyroid: No thyroid mass.     Trachea: No tracheal deviation.  Cardiovascular:     Rate and Rhythm: Normal rate and regular rhythm.     Heart sounds: Normal heart sounds. No murmur. No gallop.   Pulmonary:     Effort: Pulmonary effort is normal. No respiratory distress.     Breath  sounds: Normal breath sounds. No stridor. No decreased breath sounds, wheezing, rhonchi or rales.  Abdominal:     General: Bowel sounds are normal. There is no distension.     Palpations: Abdomen is soft.     Tenderness: There is no abdominal tenderness. There is no rebound.  Musculoskeletal: Normal range of motion.        General: No tenderness.  Skin:    General: Skin is warm and dry.     Findings: No abrasion or rash.  Neurological:  Mental Status: She is alert and oriented to person, place, and time.     GCS: GCS eye subscore is 4. GCS verbal subscore is 5. GCS motor subscore is 6.     Cranial Nerves: No cranial nerve deficit.     Sensory: No sensory deficit.     Motor: No weakness or tremor.     Coordination: Finger-Nose-Finger Test normal.  Psychiatric:        Attention and Perception: Attention normal.        Mood and Affect: Mood normal.        Speech: Speech normal.        Behavior: Behavior normal.      ED Treatments / Results  Labs (all labs ordered are listed, but only abnormal results are displayed) Labs Reviewed  CBC WITH DIFFERENTIAL/PLATELET  BASIC METABOLIC PANEL  I-STAT TROPONIN, ED    EKG EKG Interpretation  Date/Time:  Saturday November 09 2018 18:07:15 EST Ventricular Rate:  79 PR Interval:    QRS Duration: 150 QT Interval:  432 QTC Calculation: 496 R Axis:   -78 Text Interpretation:  Sinus rhythm Probable left atrial enlargement RBBB and LAFB Left ventricular hypertrophy Confirmed by Lacretia Leigh ([id number]) on 11/09/2018 6:14:52 PM   Radiology No results found.  Procedures Procedures (including critical care time)  Medications Ordered in ED Medications  0.9 %  sodium chloride infusion (has no administration in time range)     Initial Impression / Assessment and Plan / ED Course  I have reviewed the triage vital signs and the nursing notes.  Pertinent labs & imaging results that were available during my care of the patient were  reviewed by me and considered in my medical decision making (see chart for details).     Patient with a history of AAA and had a syncopal event today.  She had no abdominal discomfort.  Her blood pressure was stable.  Abdominal CT was negative for rupture but does show some increased size in her aneurysm.  Because of the history of trauma she had a head and neck CT both of which came back normal.  Troponin negative.  EKG without acute findings.  Will be admitted for evaluation of syncope  Final Clinical Impressions(s) / ED Diagnoses   Final diagnoses:  Syncope    ED Discharge Orders    None       Lacretia Leigh, MD 11/09/18 2024

## 2018-11-09 NOTE — ED Triage Notes (Addendum)
Per EMS, patient from [facility], patient had syncopal episode after feeling dizzy and falling on the ground. A&Ox3. Hx dementia. Baseline per staff.  BP 130/90 O2 100% 3L [place] CBG 168

## 2018-11-10 ENCOUNTER — Observation Stay (HOSPITAL_BASED_OUTPATIENT_CLINIC_OR_DEPARTMENT_OTHER): Payer: Medicare HMO

## 2018-11-10 DIAGNOSIS — R55 Syncope and collapse: Secondary | ICD-10-CM

## 2018-11-10 DIAGNOSIS — L8990 Pressure ulcer of unspecified site, unspecified stage: Secondary | ICD-10-CM

## 2018-11-10 LAB — BASIC METABOLIC PANEL
Anion gap: 10 (ref 5–15)
BUN: 27 mg/dL — AB (ref 8–23)
CO2: 26 mmol/L (ref 22–32)
Calcium: 8.8 mg/dL — ABNORMAL LOW (ref 8.9–10.3)
Chloride: 104 mmol/L (ref 98–111)
Creatinine, Ser: 0.89 mg/dL (ref 0.44–1.00)
GFR calc Af Amer: >60 mL/min (ref >60)
GFR calc non Af Amer: 58 mL/min — ABNORMAL LOW (ref >60)
Glucose, Bld: 109 mg/dL — ABNORMAL HIGH (ref 70–99)
Potassium: 3.9 mmol/L (ref 3.5–5.1)
Sodium: 140 mmol/L (ref 135–145)

## 2018-11-10 LAB — ECHOCARDIOGRAM COMPLETE
HEIGHTINCHES: 66 in
Weight: 2400 oz

## 2018-11-10 LAB — GLUCOSE, CAPILLARY: Glucose-Capillary: 92 mg/dL (ref 70–99)

## 2018-11-10 MED ORDER — ZOLPIDEM TARTRATE 5 MG PO TABS
5.0000 mg | ORAL_TABLET | Freq: Once | ORAL | Status: AC
  Administered 2018-11-12: 5 mg via ORAL
  Filled 2018-11-10 (×2): qty 1

## 2018-11-10 MED ORDER — SODIUM CHLORIDE 0.9 % IV SOLN
INTRAVENOUS | Status: DC
  Administered 2018-11-10 – 2018-11-11 (×2): via INTRAVENOUS

## 2018-11-10 NOTE — Progress Notes (Signed)
Pt's POA, [place] Ram, phone number is [phone]

## 2018-11-10 NOTE — Progress Notes (Signed)
PROGRESS NOTE    Margaret Li  PZW:[id number] DOB: 01-05-1929 DOA: 11/09/2018 PCP: Darcus Austin, MD   Brief Narrative: 85 year old with past medical history significant for hypertension, CVA, hypothyroidism, depression with anxiety and AAA who presents to the emergency department from a skilled nursing facility after a syncopal episode.  Patient was pushing her walker to go to the lunchroom when she became lightheaded and lost consciousness and fell to the ground. She reports that she has not been eating well, she is worried about her granddaughter and has some medical problems..   Evaluation in the ED show EKG with right bundle branch block, chest x-ray with minor atelectasis.  She was found to have positive orthostatics vitals.  Assessment & Plan:   Principal Problem:   Syncope Active Problems:   Hypertension   Hypothyroidism   Major depressive disorder with psychotic features ([place])   AAA (abdominal aortic aneurysm) (HCC)   CKD (chronic kidney disease), stage II   History of CVA (cerebrovascular accident)   Orthostasis   Dementia (HCC)   Pressure injury of skin   1-Syncope;  In the setting of orthostatic hypotension.  Poor oral intake. Continue with IV fluids. Echo with normal ejection fraction.  No significant valvular abnormality. Continue to hold hydrochlorothiazide.  2-dysphagia; Patient was noted to have a choking episode this morning while she was eating breakfast.  He reported that she has not been able to eat because food gets stuck in her esophagus. Will get an esophagogram.  Continue with full liquid diet.  Orthostatic hypotension; Continue with IV fluids.  Repeat vitals this morning.  AAA Follow-up recommended.  History of CVA Continue with a statin Consider restarting back on aspirin  Mild renal insufficiency Continue with IV fluids.  Hypothyroidism: Continue with Synthroid. Dementia with anxiety.  Continue with Namenda Zoloft BuSpar Zyprexa.  Stage  I Buttocks pressure injury, present on admission. Local care.    Pressure Injury 11/09/18 Stage I -  Intact skin with non-blanchable redness of a localized area usually over a bony prominence. Pink nonblanchable  (Active)  11/09/18 2210  Location: Buttocks  Location Orientation: Right;Left  Staging: Stage I -  Intact skin with non-blanchable redness of a localized area usually over a bony prominence.  Wound Description (Comments): Pink nonblanchable   Present on Admission: Yes      Estimated body mass index is 24.21 kg/m as calculated from the following:   Height as of this encounter: 5\' 6"  (1.676 m).   Weight as of this encounter: 68 kg.   DVT prophylaxis: (Lovenox/Heparin/SCD's/anticoagulated/None (if comfort care) Code Status: (Full/Partial - specify details) Family Communication: (Specify name, relationship & date discussed. NO "discussed with patient") Disposition Plan: (specify when and where you expect patient to be discharged). Include barriers to DC in this tab.   Consultants:   none   Procedures:  ECHO; Normal LV size with mild LV hypertrophy. EF 55-60%. Mildly   dilated RV with normal systolic function. Moderate pulmonary   hypertension. Aortic valve poorly visualized.    Antimicrobials:  none  Subjective: She is alert, denies lightheadedness.  She had a choking episode while eating pancakes this morning.  Shortness of breath.  Objective: Vitals:   11/10/18 0437 11/10/18 0438 11/10/18 0449 11/10/18 1445  BP: (!) 158/109 (!) 148/101 (!) 132/96 103/71  Pulse: 71 72  (!) 58  Resp:  20    Temp:      TempSrc:      SpO2: 96% 95%  95%  Weight:  68 kg   Height:        Intake/Output Summary (Last 24 hours) at 11/10/2018 1601 Last data filed at 11/10/2018 0408 Gross per 24 hour  Intake 630.07 ml  Output -  Net 630.07 ml   Filed Weights   11/09/18 2029 11/10/18 0449  Weight: 61.2 kg 68 kg    Examination:  General exam: Appears calm and comfortable    Respiratory system: Clear to auscultation. Respiratory effort normal. Cardiovascular system: S1 & S2 heard, RRR. No JVD, murmurs, rubs, gallops or clicks. No pedal edema. Gastrointestinal system: Abdomen is nondistended, soft and nontender. No organomegaly or masses felt. Normal bowel sounds heard. Central nervous system: Alert and oriented.  Extremities: Symmetric 5 x 5 power. Skin: No rashes, lesions or ulcers   Data Reviewed: I have personally reviewed following labs and imaging studies  CBC: Recent Labs  Lab 11/09/18 1817  WBC 10.5  NEUTROABS 8.7*  HGB 11.3*  HCT 36.0  MCV 97.3  PLT 563   Basic Metabolic Panel: Recent Labs  Lab 11/09/18 1817 11/10/18 0618  NA 140 140  K 3.4* 3.9  CL 101 104  CO2 29 26  GLUCOSE 123* 109*  BUN 32* 27*  CREATININE 1.04* 0.89  CALCIUM 9.2 8.8*   GFR: Estimated Creatinine Clearance: 41.7 mL/min (by C-G formula based on SCr of 0.89 mg/dL). Liver Function Tests: No results for input(s): AST, ALT, ALKPHOS, BILITOT, PROT, ALBUMIN in the last 168 hours. No results for input(s): LIPASE, AMYLASE in the last 168 hours. No results for input(s): AMMONIA in the last 168 hours. Coagulation Profile: No results for input(s): INR, PROTIME in the last 168 hours. Cardiac Enzymes: No results for input(s): CKTOTAL, CKMB, CKMBINDEX, TROPONINI in the last 168 hours. BNP (last 3 results) No results for input(s): PROBNP in the last 8760 hours. HbA1C: No results for input(s): HGBA1C in the last 72 hours. CBG: Recent Labs  Lab 11/10/18 0804  GLUCAP 92   Lipid Profile: No results for input(s): CHOL, HDL, LDLCALC, TRIG, CHOLHDL, LDLDIRECT in the last 72 hours. Thyroid Function Tests: No results for input(s): TSH, T4TOTAL, FREET4, T3FREE, THYROIDAB in the last 72 hours. Anemia Panel: No results for input(s): VITAMINB12, FOLATE, FERRITIN, TIBC, IRON, RETICCTPCT in the last 72 hours. Sepsis Labs: No results for input(s): PROCALCITON, LATICACIDVEN in  the last 168 hours.  Recent Results (from the past 240 hour(s))  MRSA PCR Screening     Status: None   Collection Time: 11/09/18  9:53 PM  Result Value Ref Range Status   MRSA by PCR NEGATIVE NEGATIVE Final    Comment:        The GeneXpert MRSA Assay (FDA approved for NASAL specimens only), is one component of a comprehensive MRSA colonization surveillance program. It is not intended to diagnose MRSA infection nor to guide or monitor treatment for MRSA infections. Performed at [facility], [place] [address], [place] [address], [place] [address]          Radiology Studies: Dg Chest 2 View  Result Date: 11/09/2018 CLINICAL DATA:  Syncope. EXAM: CHEST - 2 VIEW COMPARISON:  Radiograph 05/13/2018. FINDINGS: Heart is normal in size. Atherosclerotic tortuous thoracic aorta. Minor bibasilar atelectasis. No confluent airspace disease. No pleural effusion or pneumothorax. No pulmonary edema. No acute osseous abnormalities are seen. IMPRESSION: 1. Minor bibasilar atelectasis. 2.  Aortic Atherosclerosis (ICD10-I70.0). Electronically Signed   By: Keith Rake M.D.   On: 11/09/2018 19:50   Ct Head Wo Contrast  Result Date: 11/09/2018  CLINICAL DATA:  Fall.  Complaining of headache and neck pain. EXAM: CT HEAD WITHOUT CONTRAST CT CERVICAL SPINE WITHOUT CONTRAST TECHNIQUE: Multidetector CT imaging of the head and cervical spine was performed following the standard protocol without intravenous contrast. Multiplanar CT image reconstructions of the cervical spine were also generated. COMPARISON:  03/04/2018 FINDINGS: CT HEAD FINDINGS Brain: No evidence of acute infarction, hemorrhage, hydrocephalus, extra-axial collection or mass lesion/mass effect. There is age appropriate volume loss. Bilateral white matter hypoattenuation is noted consistent with moderate chronic microvascular ischemic change. Vascular: No hyperdense vessel or unexpected calcification. Skull: Normal. Negative for fracture or  focal lesion. Sinuses/Orbits: Globes and orbits are unremarkable. Visualized sinuses and mastoid air cells are clear. Other: None. CT CERVICAL SPINE FINDINGS Alignment: Normal. Skull base and vertebrae: No acute fracture. No primary bone lesion or focal pathologic process. Soft tissues and spinal canal: No prevertebral fluid or swelling. No visible canal hematoma. Disc levels: Mild loss of disc height at C2-C3 and C3-C4. Moderate loss of disc height at C4-C5 with marked loss of disc height at C5-C6 and C6-C7 spondylotic disc bulging with endplate spurring is noted at these levels. There are facet degenerative changes bilaterally. No convincing disc herniation. Upper chest: No mass or adenopathy. No acute findings. Lung apices are clear. Other: None. IMPRESSION: HEAD CT 1. No acute intracranial abnormalities. 2. Age-related volume loss. Moderate chronic microvascular ischemic change. CERVICAL CT 1. No fracture or acute finding Electronically Signed   By: Lajean Manes M.D.   On: 11/09/2018 19:38   Ct Cervical Spine Wo Contrast  Result Date: 11/09/2018 CLINICAL DATA:  Fall.  Complaining of headache and neck pain. EXAM: CT HEAD WITHOUT CONTRAST CT CERVICAL SPINE WITHOUT CONTRAST TECHNIQUE: Multidetector CT imaging of the head and cervical spine was performed following the standard protocol without intravenous contrast. Multiplanar CT image reconstructions of the cervical spine were also generated. COMPARISON:  03/04/2018 FINDINGS: CT HEAD FINDINGS Brain: No evidence of acute infarction, hemorrhage, hydrocephalus, extra-axial collection or mass lesion/mass effect. There is age appropriate volume loss. Bilateral white matter hypoattenuation is noted consistent with moderate chronic microvascular ischemic change. Vascular: No hyperdense vessel or unexpected calcification. Skull: Normal. Negative for fracture or focal lesion. Sinuses/Orbits: Globes and orbits are unremarkable. Visualized sinuses and mastoid air cells  are clear. Other: None. CT CERVICAL SPINE FINDINGS Alignment: Normal. Skull base and vertebrae: No acute fracture. No primary bone lesion or focal pathologic process. Soft tissues and spinal canal: No prevertebral fluid or swelling. No visible canal hematoma. Disc levels: Mild loss of disc height at C2-C3 and C3-C4. Moderate loss of disc height at C4-C5 with marked loss of disc height at C5-C6 and C6-C7 spondylotic disc bulging with endplate spurring is noted at these levels. There are facet degenerative changes bilaterally. No convincing disc herniation. Upper chest: No mass or adenopathy. No acute findings. Lung apices are clear. Other: None. IMPRESSION: HEAD CT 1. No acute intracranial abnormalities. 2. Age-related volume loss. Moderate chronic microvascular ischemic change. CERVICAL CT 1. No fracture or acute finding Electronically Signed   By: Lajean Manes M.D.   On: 11/09/2018 19:38   Ct Abdomen Pelvis W Contrast  Result Date: 11/09/2018 CLINICAL DATA:  Abdominal distension, history abdominal aortic aneurysm, fall, cholelithiasis, hypertension, GERD EXAM: CT ABDOMEN AND PELVIS WITH CONTRAST TECHNIQUE: Multidetector CT imaging of the abdomen and pelvis was performed using the standard protocol following bolus administration of intravenous contrast. Sagittal and coronal MPR images reconstructed from axial data set. CONTRAST:  176mL ISOVUE-300 IOPAMIDOL (  ISOVUE-300) INJECTION 61% IV. No oral contrast. COMPARISON:  04/26/2017 FINDINGS: Lower chest: Bibasilar atelectasis Hepatobiliary: Dependent calculi in gallbladder. No definite gallbladder wall thickening or biliary dilatation. Liver unremarkable. Pancreas: Atrophic pancreas with multiple tiny parenchymal calcifications consistent with chronic calcific pancreatitis. No definite pancreatic mass or ductal dilatation. Spleen: Normal appearance Adrenals/Urinary Tract: Thickening of adrenal glands without discrete mass. BILATERAL renal cysts, LEFT 3.0 x 2.6 cm  image 24 and RIGHT 6.6 x 6.0 cm image 33. No additional renal mass or hydronephrosis. No urinary tract calcification. Bladder decompressed. Stomach/Bowel: Extensive diverticulosis of distal colon. Large duodenal diverticulum. Stomach decompressed. Appendix not localized. Remaining bowel loops grossly normal appearance without dilatation, wall thickening or obstruction. Swirled appearance of the mesenteric vessels in the small bowel mesentery but no evidence of bowel obstruction or wall thickening to suggest a small bowel volvulus. Increased stool in rectum. Vascular/Lymphatic: Extensive atherosclerotic calcifications of aorta, iliac arteries, visceral arteries, coronary arteries. Tortuous and aneurysmal abdominal aorta measuring up to 3.5 x 3.4 cm distally, with aneurysmal dilatation extending into the common iliac arteries, proximal RIGHT common iliac artery 2.1 cm and proximal LEFT common iliac artery 1.6 cm. Narrowing of the celiac artery and SMA origins by calcified plaque. No adenopathy. Reproductive: Uterus surgically absent. Other: No free air or free fluid. BILATERAL breast prostheses noted. No definite inflammatory process. Musculoskeletal: Beam hardening artifacts in pelvis from RIGHT hip prosthesis. Diffuse osseous demineralization. Degenerative disc disease changes of the thoracolumbar spine. Chronic inferior endplate compression fracture of L1 unchanged. IMPRESSION: Extensive distal colonic diverticulosis without evidence of diverticulitis. Cholelithiasis. Abdominal aortic aneurysm 3.5 x 3.4 cm increased since 2018 when this measured a maximum of 3.0 x 3.0 cm. BILATERAL renal cysts. Chronic calcific pancreatitis. Electronically Signed   By: Lavonia Dana M.D.   On: 11/09/2018 19:49        Scheduled Meds: . busPIRone  5 mg Oral BID  . dorzolamide-timolol  1 drop Both Eyes BID  . enoxaparin (LOVENOX) injection  40 mg Subcutaneous QHS  . labetalol  100 mg Oral BID  . levothyroxine  25 mcg Oral  Q0600  . losartan  100 mg Oral Daily  . memantine  10 mg Oral BID  . Netarsudil Dimesylate  1 drop Both Eyes QHS  . OLANZapine  7.5 mg Oral QHS  . sertraline  50 mg Oral Daily  . simvastatin  5 mg Oral q1800  . sodium chloride flush  3 mL Intravenous Q12H   Continuous Infusions: . sodium chloride 100 mL/hr at 11/10/18 1025     LOS: 0 days    Time spent: 35 minutes.     Elmarie Shiley, MD [facility] Pager [phone]  If 7PM-7AM, please contact night-coverage [email or web address] Password [email or web address] 11/10/2018, 4:01 PM

## 2018-11-10 NOTE — Progress Notes (Signed)
Pt states that, [facility] (sp), is her POA.

## 2018-11-10 NOTE — Progress Notes (Signed)
  Echocardiogram 2D Echocardiogram has been performed.  Margaret Li 11/10/2018, 9:57 AM

## 2018-11-11 ENCOUNTER — Observation Stay (HOSPITAL_COMMUNITY): Payer: Medicare HMO

## 2018-11-11 DIAGNOSIS — I714 Abdominal aortic aneurysm, without rupture: Secondary | ICD-10-CM | POA: Diagnosis present

## 2018-11-11 DIAGNOSIS — Z8673 Personal history of transient ischemic attack (TIA), and cerebral infarction without residual deficits: Secondary | ICD-10-CM | POA: Diagnosis not present

## 2018-11-11 DIAGNOSIS — F323 Major depressive disorder, single episode, severe with psychotic features: Secondary | ICD-10-CM | POA: Diagnosis present

## 2018-11-11 DIAGNOSIS — Q396 Congenital diverticulum of esophagus: Secondary | ICD-10-CM | POA: Diagnosis not present

## 2018-11-11 DIAGNOSIS — G9341 Metabolic encephalopathy: Secondary | ICD-10-CM | POA: Diagnosis present

## 2018-11-11 DIAGNOSIS — F0150 Vascular dementia without behavioral disturbance: Secondary | ICD-10-CM | POA: Diagnosis not present

## 2018-11-11 DIAGNOSIS — K8020 Calculus of gallbladder without cholecystitis without obstruction: Secondary | ICD-10-CM | POA: Diagnosis present

## 2018-11-11 DIAGNOSIS — R1310 Dysphagia, unspecified: Secondary | ICD-10-CM | POA: Diagnosis not present

## 2018-11-11 DIAGNOSIS — K861 Other chronic pancreatitis: Secondary | ICD-10-CM | POA: Diagnosis present

## 2018-11-11 DIAGNOSIS — R55 Syncope and collapse: Secondary | ICD-10-CM | POA: Diagnosis present

## 2018-11-11 DIAGNOSIS — F0390 Unspecified dementia without behavioral disturbance: Secondary | ICD-10-CM | POA: Diagnosis present

## 2018-11-11 DIAGNOSIS — K224 Dyskinesia of esophagus: Secondary | ICD-10-CM | POA: Diagnosis present

## 2018-11-11 DIAGNOSIS — E876 Hypokalemia: Secondary | ICD-10-CM | POA: Diagnosis present

## 2018-11-11 DIAGNOSIS — I700 Atherosclerosis of aorta: Secondary | ICD-10-CM | POA: Diagnosis present

## 2018-11-11 DIAGNOSIS — I452 Bifascicular block: Secondary | ICD-10-CM | POA: Diagnosis present

## 2018-11-11 DIAGNOSIS — K228 Other specified diseases of esophagus: Secondary | ICD-10-CM | POA: Diagnosis not present

## 2018-11-11 DIAGNOSIS — I951 Orthostatic hypotension: Secondary | ICD-10-CM | POA: Diagnosis present

## 2018-11-11 DIAGNOSIS — F418 Other specified anxiety disorders: Secondary | ICD-10-CM | POA: Diagnosis present

## 2018-11-11 DIAGNOSIS — E039 Hypothyroidism, unspecified: Secondary | ICD-10-CM | POA: Diagnosis present

## 2018-11-11 DIAGNOSIS — Z6824 Body mass index (BMI) 24.0-24.9, adult: Secondary | ICD-10-CM | POA: Diagnosis not present

## 2018-11-11 DIAGNOSIS — G8929 Other chronic pain: Secondary | ICD-10-CM | POA: Diagnosis present

## 2018-11-11 DIAGNOSIS — K5750 Diverticulosis of both small and large intestine without perforation or abscess without bleeding: Secondary | ICD-10-CM | POA: Diagnosis present

## 2018-11-11 DIAGNOSIS — R627 Adult failure to thrive: Secondary | ICD-10-CM | POA: Diagnosis present

## 2018-11-11 DIAGNOSIS — W19XXXA Unspecified fall, initial encounter: Secondary | ICD-10-CM | POA: Diagnosis present

## 2018-11-11 DIAGNOSIS — R630 Anorexia: Secondary | ICD-10-CM | POA: Diagnosis present

## 2018-11-11 DIAGNOSIS — I7389 Other specified peripheral vascular diseases: Secondary | ICD-10-CM | POA: Diagnosis present

## 2018-11-11 DIAGNOSIS — I129 Hypertensive chronic kidney disease with stage 1 through stage 4 chronic kidney disease, or unspecified chronic kidney disease: Secondary | ICD-10-CM | POA: Diagnosis present

## 2018-11-11 DIAGNOSIS — J9811 Atelectasis: Secondary | ICD-10-CM | POA: Diagnosis present

## 2018-11-11 DIAGNOSIS — N182 Chronic kidney disease, stage 2 (mild): Secondary | ICD-10-CM | POA: Diagnosis present

## 2018-11-11 LAB — CORTISOL-AM, BLOOD: Cortisol - AM: 11 ug/dL (ref 6.7–22.6)

## 2018-11-11 LAB — GLUCOSE, CAPILLARY: Glucose-Capillary: 109 mg/dL — ABNORMAL HIGH (ref 70–99)

## 2018-11-11 MED ORDER — PANTOPRAZOLE SODIUM 40 MG PO TBEC
40.0000 mg | DELAYED_RELEASE_TABLET | Freq: Every day | ORAL | Status: DC
  Administered 2018-11-12 – 2018-11-15 (×4): 40 mg via ORAL
  Filled 2018-11-11 (×5): qty 1

## 2018-11-11 NOTE — Care Management Obs Status (Signed)
[facility] NOTIFICATION   Patient Details  Name: DOMINGUE COLTRAIN MRN: [id number] Date of Birth: 1929-03-25   Medicare Observation Status Notification Given:  Other (see comment) Unable to sign no fsmily in room   Leeroy Cha, RN 11/11/2018, 10:25 AM

## 2018-11-11 NOTE — Progress Notes (Signed)
PROGRESS NOTE    Margaret Li  YOV:[id number] DOB: [birth year] DOA: 11/09/2018 PCP: Darcus Austin, MD   Brief Narrative: 85 year old with past medical history significant for hypertension, CVA, hypothyroidism, depression with anxiety and AAA who presents to the emergency department from a skilled nursing facility after a syncopal episode.  Patient was pushing her walker to go to the lunchroom when she became lightheaded and lost consciousness and fell to the ground. She reports that she has not been eating well, she is worried about her granddaughter and has some medical problems..   Evaluation in the ED show EKG with right bundle branch block, chest x-ray with minor atelectasis.  She was found to have positive orthostatics vitals.  Assessment & Plan:   Principal Problem:   Syncope Active Problems:   Hypertension   Hypothyroidism   Major depressive disorder with psychotic features ([place])   AAA (abdominal aortic aneurysm) (HCC)   CKD (chronic kidney disease), stage II   History of CVA (cerebrovascular accident)   Orthostasis   Dementia (HCC)   Pressure injury of skin   1-Syncope;  In the setting of orthostatic hypotension.  Poor oral intake. Continue with IV fluids. Echo with normal ejection fraction.  No significant valvular abnormality. Continue to hold hydrochlorothiazide.  2-Dysphagia; Patient was noted to have a choking episode this morning while she was eating breakfast.  He reported that she has not been able to eat because food gets stuck in her esophagus. Esophagogram; esophageal dysmotility, esophageal diverticulum. Start Protonix. Needs GI follow up.   Orthostatic hypotension; Continue with IV fluids.  Repeat vitals this morning. Still orthostatic and weak.  Will get PT.  Continue with IV fluids overnight.   AAA Follow-up recommended.  History of CVA Continue with a statin Consider restarting back on aspirin  Mild renal insufficiency Continue with IV  fluids.  Hypothyroidism: Continue with Synthroid. Dementia with anxiety.  Continue with Namenda Zoloft BuSpar Zyprexa.  Stage I Buttocks pressure injury, present on admission. Local care.    Pressure Injury 11/09/18 Stage I -  Intact skin with non-blanchable redness of a localized area usually over a bony prominence. Pink nonblanchable  (Active)  11/09/18 2210  Location: Buttocks  Location Orientation: Right;Left  Staging: Stage I -  Intact skin with non-blanchable redness of a localized area usually over a bony prominence.  Wound Description (Comments): Pink nonblanchable   Present on Admission: Yes      Estimated body mass index is 26.31 kg/m as calculated from the following:   Height as of this encounter: 5\' 6"  (1.676 m).   Weight as of this encounter: 73.9 kg.   DVT prophylaxis: (Lovenox/Heparin/SCD's/anticoagulated/None (if comfort care) Code Status: (Full/Partial - specify details) Family Communication: (Specify name, relationship & date discussed. NO "discussed with patient") Disposition Plan: (specify when and where you expect patient to be discharged). Include barriers to DC in this tab.   Consultants:   none   Procedures:  ECHO; Normal LV size with mild LV hypertrophy. EF 55-60%. Mildly   dilated RV with normal systolic function. Moderate pulmonary   hypertension. Aortic valve poorly visualized.    Antimicrobials:  none  Subjective: Patient is sleepy, she wake up to voice. Per nursing staff when performing orthostatics vital patient was very weak they have to hold patient. Patient has been on full liquid diet, will advance to soft diet.  Make sure patient is able to tolerate soft diet today. She is more sleepy today. Objective: Vitals:   11/10/18 1725 11/10/18  2128 11/11/18 0430 11/11/18 1419  BP: 91/71 (!) 143/94 (!) 152/91 (!) 154/97  Pulse: 71 65 66 64  Resp: 20 20 20 18   Temp: 98.1 F (36.7 C) 97.9 F (36.6 C) 98.1 F (36.7 C) 98.1 F (36.7  C)  TempSrc: Oral Oral Oral Oral  SpO2: 97% 95% 98% 98%  Weight:   73.9 kg   Height:        Intake/Output Summary (Last 24 hours) at 11/11/2018 1605 Last data filed at 11/11/2018 1251 Gross per 24 hour  Intake 2946 ml  Output -  Net 2946 ml   Filed Weights   11/09/18 2029 11/10/18 0449 11/11/18 0430  Weight: 61.2 kg 68 kg 73.9 kg    Examination:  General exam: Sleepy will answer a few questions. Respiratory system: Clear to auscultation Cardiovascular system: S1, S2 regular rhythm and rate Gastrointestinal system: Bowel sounds present soft nontender nondistended Central nervous system: Sleepy, answer a few questions. Extremities: Symmetric 5 x 5 power. Skin: No rashes   Data Reviewed: I have personally reviewed following labs and imaging studies  CBC: Recent Labs  Lab 11/09/18 1817  WBC 10.5  NEUTROABS 8.7*  HGB 11.3*  HCT 36.0  MCV 97.3  PLT 938   Basic Metabolic Panel: Recent Labs  Lab 11/09/18 1817 11/10/18 0618  NA 140 140  K 3.4* 3.9  CL 101 104  CO2 29 26  GLUCOSE 123* 109*  BUN 32* 27*  CREATININE 1.04* 0.89  CALCIUM 9.2 8.8*   GFR: Estimated Creatinine Clearance: 45.8 mL/min (by C-G formula based on SCr of 0.89 mg/dL). Liver Function Tests: No results for input(s): AST, ALT, ALKPHOS, BILITOT, PROT, ALBUMIN in the last 168 hours. No results for input(s): LIPASE, AMYLASE in the last 168 hours. No results for input(s): AMMONIA in the last 168 hours. Coagulation Profile: No results for input(s): INR, PROTIME in the last 168 hours. Cardiac Enzymes: No results for input(s): CKTOTAL, CKMB, CKMBINDEX, TROPONINI in the last 168 hours. BNP (last 3 results) No results for input(s): PROBNP in the last 8760 hours. HbA1C: No results for input(s): HGBA1C in the last 72 hours. CBG: Recent Labs  Lab 11/10/18 0804 11/11/18 0432  GLUCAP 92 109*   Lipid Profile: No results for input(s): CHOL, HDL, LDLCALC, TRIG, CHOLHDL, LDLDIRECT in the last 72  hours. Thyroid Function Tests: No results for input(s): TSH, T4TOTAL, FREET4, T3FREE, THYROIDAB in the last 72 hours. Anemia Panel: No results for input(s): VITAMINB12, FOLATE, FERRITIN, TIBC, IRON, RETICCTPCT in the last 72 hours. Sepsis Labs: No results for input(s): PROCALCITON, LATICACIDVEN in the last 168 hours.  Recent Results (from the past 240 hour(s))  MRSA PCR Screening     Status: None   Collection Time: 11/09/18  9:53 PM  Result Value Ref Range Status   MRSA by PCR NEGATIVE NEGATIVE Final    Comment:        The GeneXpert MRSA Assay (FDA approved for NASAL specimens only), is one component of a comprehensive MRSA colonization surveillance program. It is not intended to diagnose MRSA infection nor to guide or monitor treatment for MRSA infections. Performed at [facility], [place] [address], [place], [place] [address]          Radiology Studies: Dg Chest 2 View  Result Date: 11/09/2018 CLINICAL DATA:  Syncope. EXAM: CHEST - 2 VIEW COMPARISON:  Radiograph 05/13/2018. FINDINGS: Heart is normal in size. Atherosclerotic tortuous thoracic aorta. Minor bibasilar atelectasis. No confluent airspace disease. No pleural effusion or  pneumothorax. No pulmonary edema. No acute osseous abnormalities are seen. IMPRESSION: 1. Minor bibasilar atelectasis. 2.  Aortic Atherosclerosis (ICD10-I70.0). Electronically Signed   By: Keith Rake M.D.   On: 11/09/2018 19:50   Ct Head Wo Contrast  Result Date: 11/09/2018 CLINICAL DATA:  Fall.  Complaining of headache and neck pain. EXAM: CT HEAD WITHOUT CONTRAST CT CERVICAL SPINE WITHOUT CONTRAST TECHNIQUE: Multidetector CT imaging of the head and cervical spine was performed following the standard protocol without intravenous contrast. Multiplanar CT image reconstructions of the cervical spine were also generated. COMPARISON:  03/04/2018 FINDINGS: CT HEAD FINDINGS Brain: No evidence of acute infarction, hemorrhage,  hydrocephalus, extra-axial collection or mass lesion/mass effect. There is age appropriate volume loss. Bilateral white matter hypoattenuation is noted consistent with moderate chronic microvascular ischemic change. Vascular: No hyperdense vessel or unexpected calcification. Skull: Normal. Negative for fracture or focal lesion. Sinuses/Orbits: Globes and orbits are unremarkable. Visualized sinuses and mastoid air cells are clear. Other: None. CT CERVICAL SPINE FINDINGS Alignment: Normal. Skull base and vertebrae: No acute fracture. No primary bone lesion or focal pathologic process. Soft tissues and spinal canal: No prevertebral fluid or swelling. No visible canal hematoma. Disc levels: Mild loss of disc height at C2-C3 and C3-C4. Moderate loss of disc height at C4-C5 with marked loss of disc height at C5-C6 and C6-C7 spondylotic disc bulging with endplate spurring is noted at these levels. There are facet degenerative changes bilaterally. No convincing disc herniation. Upper chest: No mass or adenopathy. No acute findings. Lung apices are clear. Other: None. IMPRESSION: HEAD CT 1. No acute intracranial abnormalities. 2. Age-related volume loss. Moderate chronic microvascular ischemic change. CERVICAL CT 1. No fracture or acute finding Electronically Signed   By: Lajean Manes M.D.   On: 11/09/2018 19:38   Ct Cervical Spine Wo Contrast  Result Date: 11/09/2018 CLINICAL DATA:  Fall.  Complaining of headache and neck pain. EXAM: CT HEAD WITHOUT CONTRAST CT CERVICAL SPINE WITHOUT CONTRAST TECHNIQUE: Multidetector CT imaging of the head and cervical spine was performed following the standard protocol without intravenous contrast. Multiplanar CT image reconstructions of the cervical spine were also generated. COMPARISON:  03/04/2018 FINDINGS: CT HEAD FINDINGS Brain: No evidence of acute infarction, hemorrhage, hydrocephalus, extra-axial collection or mass lesion/mass effect. There is age appropriate volume loss.  Bilateral white matter hypoattenuation is noted consistent with moderate chronic microvascular ischemic change. Vascular: No hyperdense vessel or unexpected calcification. Skull: Normal. Negative for fracture or focal lesion. Sinuses/Orbits: Globes and orbits are unremarkable. Visualized sinuses and mastoid air cells are clear. Other: None. CT CERVICAL SPINE FINDINGS Alignment: Normal. Skull base and vertebrae: No acute fracture. No primary bone lesion or focal pathologic process. Soft tissues and spinal canal: No prevertebral fluid or swelling. No visible canal hematoma. Disc levels: Mild loss of disc height at C2-C3 and C3-C4. Moderate loss of disc height at C4-C5 with marked loss of disc height at C5-C6 and C6-C7 spondylotic disc bulging with endplate spurring is noted at these levels. There are facet degenerative changes bilaterally. No convincing disc herniation. Upper chest: No mass or adenopathy. No acute findings. Lung apices are clear. Other: None. IMPRESSION: HEAD CT 1. No acute intracranial abnormalities. 2. Age-related volume loss. Moderate chronic microvascular ischemic change. CERVICAL CT 1. No fracture or acute finding Electronically Signed   By: Lajean Manes M.D.   On: 11/09/2018 19:38   Ct Abdomen Pelvis W Contrast  Result Date: 11/09/2018 CLINICAL DATA:  Abdominal distension, history abdominal aortic aneurysm, fall, cholelithiasis, hypertension,  GERD EXAM: CT ABDOMEN AND PELVIS WITH CONTRAST TECHNIQUE: Multidetector CT imaging of the abdomen and pelvis was performed using the standard protocol following bolus administration of intravenous contrast. Sagittal and coronal MPR images reconstructed from axial data set. CONTRAST:  162mL ISOVUE-300 IOPAMIDOL (ISOVUE-300) INJECTION 61% IV. No oral contrast. COMPARISON:  04/26/2017 FINDINGS: Lower chest: Bibasilar atelectasis Hepatobiliary: Dependent calculi in gallbladder. No definite gallbladder wall thickening or biliary dilatation. Liver  unremarkable. Pancreas: Atrophic pancreas with multiple tiny parenchymal calcifications consistent with chronic calcific pancreatitis. No definite pancreatic mass or ductal dilatation. Spleen: Normal appearance Adrenals/Urinary Tract: Thickening of adrenal glands without discrete mass. BILATERAL renal cysts, LEFT 3.0 x 2.6 cm image 24 and RIGHT 6.6 x 6.0 cm image 33. No additional renal mass or hydronephrosis. No urinary tract calcification. Bladder decompressed. Stomach/Bowel: Extensive diverticulosis of distal colon. Large duodenal diverticulum. Stomach decompressed. Appendix not localized. Remaining bowel loops grossly normal appearance without dilatation, wall thickening or obstruction. Swirled appearance of the mesenteric vessels in the small bowel mesentery but no evidence of bowel obstruction or wall thickening to suggest a small bowel volvulus. Increased stool in rectum. Vascular/Lymphatic: Extensive atherosclerotic calcifications of aorta, iliac arteries, visceral arteries, coronary arteries. Tortuous and aneurysmal abdominal aorta measuring up to 3.5 x 3.4 cm distally, with aneurysmal dilatation extending into the common iliac arteries, proximal RIGHT common iliac artery 2.1 cm and proximal LEFT common iliac artery 1.6 cm. Narrowing of the celiac artery and SMA origins by calcified plaque. No adenopathy. Reproductive: Uterus surgically absent. Other: No free air or free fluid. BILATERAL breast prostheses noted. No definite inflammatory process. Musculoskeletal: Beam hardening artifacts in pelvis from RIGHT hip prosthesis. Diffuse osseous demineralization. Degenerative disc disease changes of the thoracolumbar spine. Chronic inferior endplate compression fracture of L1 unchanged. IMPRESSION: Extensive distal colonic diverticulosis without evidence of diverticulitis. Cholelithiasis. Abdominal aortic aneurysm 3.5 x 3.4 cm increased since 2018 when this measured a maximum of 3.0 x 3.0 cm. BILATERAL renal  cysts. Chronic calcific pancreatitis. Electronically Signed   By: Lavonia Dana M.D.   On: 11/09/2018 19:49   Dg Esophagus Inc Scout Chest & Delayed Img Single Cm (ba Or Sol)  Result Date: 11/11/2018 CLINICAL DATA:  Dysphasia during breakfast. Patient reports food gets stuck in throat. EXAM: ESOPHOGRAM/BARIUM SWALLOW TECHNIQUE: Single contrast examination was performed using  thin barium. FLUOROSCOPY TIME:  Fluoroscopy Time:  1 minutes 36 seconds Radiation Exposure Index (if provided by the fluoroscopic device): 7.5 mGy Number of Acquired Spot Images: 1 COMPARISON:  None. FINDINGS: A limited single contrast exam was performed with the patient in the semi upright LPO orientation. The patient ingested barium through a straw. The esophagus is patent. There is no high-grade stricture or mass identified. There is marked esophageal dysmotility with multiple tertiary waves identified throughout the swallowing mechanism. Within the distal esophagus there is a paraesophageal diverticulum measuring approximately 1.6 cm. IMPRESSION: 1. Marked esophageal dysmotility. 2. Distal esophageal diverticulum. Electronically Signed   By: Kerby Moors M.D.   On: 11/11/2018 15:23        Scheduled Meds: . busPIRone  5 mg Oral BID  . dorzolamide-timolol  1 drop Both Eyes BID  . enoxaparin (LOVENOX) injection  40 mg Subcutaneous QHS  . labetalol  100 mg Oral BID  . levothyroxine  25 mcg Oral Q0600  . losartan  100 mg Oral Daily  . memantine  10 mg Oral BID  . Netarsudil Dimesylate  1 drop Both Eyes QHS  . OLANZapine  7.5 mg Oral QHS  .  pantoprazole  40 mg Oral Daily  . sertraline  50 mg Oral Daily  . simvastatin  5 mg Oral q1800  . sodium chloride flush  3 mL Intravenous Q12H  . zolpidem  5 mg Oral Once   Continuous Infusions: . sodium chloride 100 mL/hr at 11/11/18 0822     LOS: 0 days    Time spent: 35 minutes.     Elmarie Shiley, MD [facility] Pager [phone]  If 7PM-7AM, please  contact night-coverage [email or web address] Password [email or web address] 11/11/2018, 4:05 PM

## 2018-11-12 ENCOUNTER — Other Ambulatory Visit: Payer: Self-pay

## 2018-11-12 ENCOUNTER — Inpatient Hospital Stay (HOSPITAL_COMMUNITY): Payer: Medicare HMO

## 2018-11-12 LAB — TSH: TSH: 3.388 u[IU]/mL (ref 0.350–4.500)

## 2018-11-12 LAB — TROPONIN I
Troponin I: 0.31 ng/mL (ref <0.03)
Troponin I: 0.26 ng/mL (ref <0.03)

## 2018-11-12 LAB — GLUCOSE, CAPILLARY
Glucose-Capillary: 103 mg/dL — ABNORMAL HIGH (ref 70–99)
Glucose-Capillary: 107 mg/dL — ABNORMAL HIGH (ref 70–99)

## 2018-11-12 MED ORDER — ASPIRIN EC 81 MG PO TBEC
81.0000 mg | DELAYED_RELEASE_TABLET | Freq: Every day | ORAL | Status: DC
  Administered 2018-11-12 – 2018-11-15 (×4): 81 mg via ORAL
  Filled 2018-11-12 (×4): qty 1

## 2018-11-12 MED ORDER — LEVOTHYROXINE SODIUM 50 MCG PO TABS
50.0000 ug | ORAL_TABLET | Freq: Every day | ORAL | Status: DC
  Administered 2018-11-13 – 2018-11-15 (×3): 50 ug via ORAL
  Filled 2018-11-12 (×3): qty 1

## 2018-11-12 MED ORDER — NITROGLYCERIN 0.4 MG SL SUBL: 0.4000 mg | SUBLINGUAL_TABLET | SUBLINGUAL | Status: DC | PRN

## 2018-11-12 MED ORDER — HYDRALAZINE HCL 20 MG/ML IJ SOLN
10.0000 mg | Freq: Once | INTRAMUSCULAR | Status: AC
  Administered 2018-11-12: 10 mg via INTRAVENOUS
  Filled 2018-11-12: qty 1

## 2018-11-12 MED ORDER — HYDRALAZINE HCL 20 MG/ML IJ SOLN
10.0000 mg | Freq: Four times a day (QID) | INTRAMUSCULAR | Status: DC | PRN
  Administered 2018-11-12 – 2018-11-15 (×4): 10 mg via INTRAVENOUS
  Filled 2018-11-12 (×4): qty 1

## 2018-11-12 NOTE — Progress Notes (Signed)
Patient from [place].   No formal consult.   LCSW will follow for dc needs.   Carolin Coy [place]

## 2018-11-12 NOTE — Progress Notes (Addendum)
CRITICAL VALUE ALERT  Critical Value: troponin = 0.31  Date & Time Notied:  11/12/18 @ 9022  Provider Notified: MD Regalado  Orders Received/Actions taken: waiting for  order

## 2018-11-12 NOTE — Progress Notes (Signed)
RN got a phone call from POA twice today and update about patient. POA Forest Becker [phone]

## 2018-11-12 NOTE — Progress Notes (Signed)
Pt found sitting on bottom on mat beside bed. No c/o pain; no cuts, abrasions, or bruises.  NP on call and grandson notified. No new orders given. VS 161/104, 98.1, 69, 18, 99% on RA.

## 2018-11-12 NOTE — Evaluation (Signed)
Physical Therapy Evaluation Patient Details Name: Margaret Li MRN: [id number] DOB: 1929/03/22 Today's Date: 11/12/2018   History of Present Illness  85 year old with past medical history significant for hypertension, CVA, hypothyroidism, depression with anxiety and AAA who came to ED after falling after syncopal episode at her facility while ambulating to dining room.  Clinical Impression  Pt admitted with above diagnosis. Pt currently with functional limitations due to the deficits listed below (see PT Problem List). Pt will benefit from skilled PT to increase their independence and safety with mobility to allow discharge to the venue listed below.  Pt normally lives at an ALF where she has some A for dressing, but ambulates in the facility with rollator without assistance.  At PT eval, she was very fearful of falling with transfer from bed > recliner and required MOD A.  Recommend SNF to work towards returning to her higher functional level.     Follow Up Recommendations SNF    Equipment Recommendations  None recommended by PT    Recommendations for Other Services       Precautions / Restrictions Precautions Precautions: Fall Restrictions Weight Bearing Restrictions: No      Mobility  Bed Mobility Overal bed mobility: Needs Assistance Bed Mobility: Supine to Sit     Supine to sit: Min assist     General bed mobility comments: Pt able to initiate with her LE, but unable to get trunk upright or fully turn without MIN A.  Transfers Overall transfer level: Needs assistance Equipment used: Rolling walker (2 wheeled) Transfers: Sit to/from [facility] Sit to Stand: Min assist;Mod assist Stand pivot transfers: Mod assist       General transfer comment: Attempted to stand once and pt too fearful and sat back down.  Stood again with MIN/MOD A. SPT with RW to recliner with short shuffled steps and fear of falling. Cues to get fully turned prior to  sitting.  Ambulation/Gait             General Gait Details: deferred due to fear of falling  Stairs            Wheelchair Mobility    Modified Rankin (Stroke Patients Only)       Balance Overall balance assessment: Needs assistance   Sitting balance-Leahy Scale: Fair       Standing balance-Leahy Scale: Poor Standing balance comment: requires UE support                             Pertinent Vitals/Pain Pain Assessment: Faces Faces Pain Scale: Hurts little more Pain Location: stomach Pain Descriptors / Indicators: Aching Pain Intervention(s): Monitored during session;Repositioned;Limited activity within patient's tolerance    Home Living Family/patient expects to be discharged to:: Assisted living([name])               Home Equipment: Gilford Rile - 4 wheels      Prior Function Level of Independence: Needs assistance   Gait / Transfers Assistance Needed: Amb with rollator  ADL's / Homemaking Assistance Needed: Staff A her with getting dressed, meals in dining room        Hand Dominance        Extremity/Trunk Assessment   Upper Extremity Assessment Upper Extremity Assessment: Generalized weakness    Lower Extremity Assessment Lower Extremity Assessment: Generalized weakness       Communication   Communication: No difficulties  Cognition Arousal/Alertness: Awake/alert   Overall Cognitive Status:  Within Functional Limits for tasks assessed                                        General Comments      Exercises     Assessment/Plan    PT Assessment Patient needs continued PT services  PT Problem List Decreased strength;Decreased activity tolerance;Decreased balance;Decreased mobility;Decreased knowledge of use of DME;Decreased safety awareness       PT Treatment Interventions DME instruction;Gait training;Therapeutic exercise;Therapeutic activities;Functional mobility training;Balance  training;Neuromuscular re-education    PT Goals (Current goals can be found in the Care Plan section)  Acute Rehab PT Goals Patient Stated Goal: Agreeable to get up to recliner PT Goal Formulation: With patient Time For Goal Achievement: 11/26/18    Frequency Min 2X/week   Barriers to discharge        Co-evaluation               AM-PAC PT "6 Clicks" Mobility  Outcome Measure Help needed turning from your back to your side while in a flat bed without using bedrails?: A Little Help needed moving from lying on your back to sitting on the side of a flat bed without using bedrails?: A Little Help needed moving to and from a bed to a chair (including a wheelchair)?: A Lot Help needed standing up from a chair using your arms (e.g., wheelchair or bedside chair)?: A Lot Help needed to walk in hospital room?: A Lot Help needed climbing 3-5 steps with a railing? : A Lot 6 Click Score: 14    End of Session Equipment Utilized During Treatment: Gait belt Activity Tolerance: Patient limited by fatigue Patient left: in chair;with chair alarm set;with call bell/phone within reach Nurse Communication: Mobility status(nurse tech) PT Visit Diagnosis: Muscle weakness (generalized) (M62.81);Difficulty in walking, not elsewhere classified (R26.2)    Time: 2585-2778 PT Time Calculation (min) (ACUTE ONLY): 28 min   Charges:   PT Evaluation $PT Eval Moderate Complexity: 1 Mod PT Treatments $Therapeutic Activity: 8-22 mins        Juron Vorhees L. Tamala Julian, [place] Pager [phone] 11/12/2018   Galen Manila 11/12/2018, 1:37 PM

## 2018-11-12 NOTE — Progress Notes (Addendum)
PROGRESS NOTE    Margaret Li  XTG:[id number] DOB: Mar 31, 1929 DOA: 11/09/2018 PCP: Darcus Austin, MD   Brief Narrative: 85 year old with past medical history significant for hypertension, CVA, hypothyroidism, depression with anxiety and AAA who presents to the emergency department from a skilled nursing facility after a syncopal episode.  Patient was pushing her walker to go to the lunchroom when she became lightheaded and lost consciousness and fell to the ground. She reports that she has not been eating well, she is worried about her granddaughter and has some medical problems..   Evaluation in the ED show EKG with right bundle branch block, chest x-ray with minor atelectasis.  She was found to have positive orthostatics vitals.  Assessment & Plan:   Principal Problem:   Syncope Active Problems:   Hypertension   Hypothyroidism   Major depressive disorder with psychotic features ([place])   AAA (abdominal aortic aneurysm) (HCC)   CKD (chronic kidney disease), stage II   History of CVA (cerebrovascular accident)   Orthostasis   Dementia (HCC)   Pressure injury of skin   1-Syncope;  In the setting of orthostatic hypotension.  Poor oral intake. Treated  with IV fluids. Echo with normal ejection fraction.  No significant valvular abnormality. Continue to hold hydrochlorothiazide. Orthostatic vital normalized.  Stop fluids.   2-Dysphagia; Patient was noted to have a choking episode this morning while she was eating breakfast.  He reported that she has not been able to eat because food gets stuck in her esophagus. Esophagogram; esophageal dysmotility, esophageal diverticulum. Started  Protonix. Needs GI follow up outpatient.   Acute  metabolic encephalopathy;  Check MRI brain  Mild elevation troponin;  Might be related to Severe elevation of BP earlier today.  Check EKG.  Nitroglycerin PRN.  Aspirin  Continue to cycle.  Already statin.   Orthostatic hypotension; Treated  with  with IV fluids.   Normalized.   HTN;  Blood pressure elevated to day, continue with cozaar and labetalol.  Continue with PRN hydralazine.   Dyspnea;  She report SOB. Chest x ray ordered. NSL.  Cycle enzymes.  Check EKG.   AAA Follow-up recommended.  History of CVA Continue with a statin Consider restarting back on aspirin  Mild renal insufficiency Continue with IV fluids.  Hypothyroidism: Continue with Synthroid. Dementia with anxiety.  Continue with Namenda Zoloft BuSpar Zyprexa.  Stage I Buttocks pressure injury, present on admission. Local care.    Pressure Injury 11/09/18 Stage I -  Intact skin with non-blanchable redness of a localized area usually over a bony prominence. Pink nonblanchable  (Active)  11/09/18 2210  Location: Buttocks  Location Orientation: Right;Left  Staging: Stage I -  Intact skin with non-blanchable redness of a localized area usually over a bony prominence.  Wound Description (Comments): Pink nonblanchable   Present on Admission: Yes      Estimated body mass index is 26.31 kg/m as calculated from the following:   Height as of this encounter: 5\' 6"  (1.676 m).   Weight as of this encounter: 73.9 kg.   DVT prophylaxis:  Code Status: full code.  Family Communication: Please update : POA Dianne Forest Becker [phone] Disposition Plan: remain in the hospital, develops confusion today, check MRI   Consultants:   none   Procedures:  ECHO; Normal LV size with mild LV hypertrophy. EF 55-60%. Mildly   dilated RV with normal systolic function. Moderate pulmonary   hypertension. Aortic valve poorly visualized.    Antimicrobials:  none  Subjective:  Patient continue to be weak. She is confuse today, having hallucination.   Objective: Vitals:   11/12/18 0900 11/12/18 1113 11/12/18 1327 11/12/18 1415  BP: (!) 163/102 (!) 162/100  137/90  Pulse: 66   60  Resp: 18 20  18   Temp: 97.6 F (36.4 C)   97.7 F (36.5 C)  TempSrc:  Oral   Oral  SpO2: 93% 92% 96% 100%  Weight:      Height:        Intake/Output Summary (Last 24 hours) at 11/12/2018 1624 Last data filed at 11/12/2018 0800 Gross per 24 hour  Intake 2792.26 ml  Output 325 ml  Net 2467.26 ml   Filed Weights   11/09/18 2029 11/10/18 0449 11/11/18 0430  Weight: 61.2 kg 68 kg 73.9 kg    Examination:  General exam: Alert, confuse.  Respiratory system:  Cardiovascular system: S1, S2 regular rhythm and rate Gastrointestinal system: Bowel sounds present soft nontender nondistended Central nervous system: alert, confuse, non focal. Generalized weakness.  Extremities: symmetric power.  Skin: no rashes.    Data Reviewed: I have personally reviewed following labs and imaging studies  CBC: Recent Labs  Lab 11/09/18 1817  WBC 10.5  NEUTROABS 8.7*  HGB 11.3*  HCT 36.0  MCV 97.3  PLT 836   Basic Metabolic Panel: Recent Labs  Lab 11/09/18 1817 11/10/18 0618  NA 140 140  K 3.4* 3.9  CL 101 104  CO2 29 26  GLUCOSE 123* 109*  BUN 32* 27*  CREATININE 1.04* 0.89  CALCIUM 9.2 8.8*   GFR: Estimated Creatinine Clearance: 45.8 mL/min (by C-G formula based on SCr of 0.89 mg/dL). Liver Function Tests: No results for input(s): AST, ALT, ALKPHOS, BILITOT, PROT, ALBUMIN in the last 168 hours. No results for input(s): LIPASE, AMYLASE in the last 168 hours. No results for input(s): AMMONIA in the last 168 hours. Coagulation Profile: No results for input(s): INR, PROTIME in the last 168 hours. Cardiac Enzymes: No results for input(s): CKTOTAL, CKMB, CKMBINDEX, TROPONINI in the last 168 hours. BNP (last 3 results) No results for input(s): PROBNP in the last 8760 hours. HbA1C: No results for input(s): HGBA1C in the last 72 hours. CBG: Recent Labs  Lab 11/10/18 0804 11/11/18 0432 11/12/18 0554 11/12/18 0726  GLUCAP 92 109* 103* 107*   Lipid Profile: No results for input(s): CHOL, HDL, LDLCALC, TRIG, CHOLHDL, LDLDIRECT in the last 72  hours. Thyroid Function Tests: Recent Labs    11/12/18 0541  TSH 3.388   Anemia Panel: No results for input(s): VITAMINB12, FOLATE, FERRITIN, TIBC, IRON, RETICCTPCT in the last 72 hours. Sepsis Labs: No results for input(s): PROCALCITON, LATICACIDVEN in the last 168 hours.  Recent Results (from the past 240 hour(s))  MRSA PCR Screening     Status: None   Collection Time: 11/09/18  9:53 PM  Result Value Ref Range Status   MRSA by PCR NEGATIVE NEGATIVE Final    Comment:        The GeneXpert MRSA Assay (FDA approved for NASAL specimens only), is one component of a comprehensive MRSA colonization surveillance program. It is not intended to diagnose MRSA infection nor to guide or monitor treatment for MRSA infections. Performed at [facility], [place] [address] [place], [place] [address]          Radiology Studies: Dg Chest Port 1 View  Result Date: 11/12/2018 CLINICAL DATA:  Shortness of breath. EXAM: PORTABLE CHEST 1 VIEW COMPARISON:  Chest x-ray 11/09/2017, 05/13/2018. FINDINGS: Mediastinum hilar  structures are stable. Heart size stable. Chronic bilateral interstitial prominence noted. No change from multiple prior exams. Stable elevation left hemidiaphragm. No acute bony abnormality. Stable thoracic spine scoliosis. IMPRESSION: Chronic bilateral interstitial prominence. No change from multiple prior exams. Stable elevation left hemidiaphragm. Electronically Signed   By: [place]   On: 11/12/2018 11:40   Dg Esophagus Inc Scout Chest & Delayed Img Single Cm (ba Or Sol)  Result Date: 11/11/2018 CLINICAL DATA:  Dysphasia during breakfast. Patient reports food gets stuck in throat. EXAM: ESOPHOGRAM/BARIUM SWALLOW TECHNIQUE: Single contrast examination was performed using  thin barium. FLUOROSCOPY TIME:  Fluoroscopy Time:  1 minutes 36 seconds Radiation Exposure Index (if provided by the fluoroscopic device): 7.5 mGy Number of Acquired Spot Images: 1  COMPARISON:  None. FINDINGS: A limited single contrast exam was performed with the patient in the semi upright LPO orientation. The patient ingested barium through a straw. The esophagus is patent. There is no high-grade stricture or mass identified. There is marked esophageal dysmotility with multiple tertiary waves identified throughout the swallowing mechanism. Within the distal esophagus there is a paraesophageal diverticulum measuring approximately 1.6 cm. IMPRESSION: 1. Marked esophageal dysmotility. 2. Distal esophageal diverticulum. Electronically Signed   By: Kerby Moors M.D.   On: 11/11/2018 15:23        Scheduled Meds: . busPIRone  5 mg Oral BID  . dorzolamide-timolol  1 drop Both Eyes BID  . enoxaparin (LOVENOX) injection  40 mg Subcutaneous QHS  . labetalol  100 mg Oral BID  . [START ON 11/13/2018] levothyroxine  50 mcg Oral QAC breakfast  . losartan  100 mg Oral Daily  . memantine  10 mg Oral BID  . Netarsudil Dimesylate  1 drop Both Eyes QHS  . OLANZapine  7.5 mg Oral QHS  . pantoprazole  40 mg Oral Daily  . sertraline  50 mg Oral Daily  . simvastatin  5 mg Oral q1800  . sodium chloride flush  3 mL Intravenous Q12H  . zolpidem  5 mg Oral Once   Continuous Infusions:    LOS: 1 day    Time spent: 35 minutes.     Elmarie Shiley, MD [facility] Pager [phone]  If 7PM-7AM, please contact night-coverage [email or web address] Password [facility] 11/12/2018, 4:24 PM

## 2018-11-12 NOTE — Progress Notes (Signed)
BP= 162/106. Patient has history of HTN. Hydralazine IV was given as MD ordered. Will recheck BP in 30 minutes.

## 2018-11-13 DIAGNOSIS — F0150 Vascular dementia without behavioral disturbance: Secondary | ICD-10-CM

## 2018-11-13 DIAGNOSIS — R1310 Dysphagia, unspecified: Secondary | ICD-10-CM

## 2018-11-13 LAB — GLUCOSE, CAPILLARY
Glucose-Capillary: 105 mg/dL — ABNORMAL HIGH (ref 70–99)
Glucose-Capillary: 99 mg/dL (ref 70–99)

## 2018-11-13 LAB — TROPONIN I: Troponin I: 0.21 ng/mL (ref <0.03)

## 2018-11-13 MED ORDER — SENNOSIDES-DOCUSATE SODIUM 8.6-50 MG PO TABS
1.0000 | ORAL_TABLET | Freq: Two times a day (BID) | ORAL | Status: DC
  Administered 2018-11-13 – 2018-11-15 (×5): 1 via ORAL
  Filled 2018-11-13 (×5): qty 1

## 2018-11-13 MED ORDER — POLYETHYLENE GLYCOL 3350 17 G PO PACK
17.0000 g | PACK | Freq: Every day | ORAL | Status: DC
  Administered 2018-11-13 – 2018-11-14 (×2): 17 g via ORAL
  Filled 2018-11-13 (×2): qty 1

## 2018-11-13 NOTE — Progress Notes (Signed)
BP= 171/106. Patient has history of HTN. Hydralazine IV was given as MD ordered. Will recheck BP in 30 minutes.

## 2018-11-13 NOTE — Progress Notes (Signed)
Physical Therapy Treatment Patient Details Name: [name] MRN: [id number] DOB: Dec 29, 1928 Today's Date: 11/13/2018    History of Present Illness 85 year old with past medical history significant for hypertension, CVA, hypothyroidism, depression with anxiety and AAA who came to ED after falling after syncopal episode at her facility while ambulating to dining room.    PT Comments    Per chart review pt was found on floor next to be an unwitness fall last night.  Assisted OOB to amb to bathroom.  General Gait Details: very unsteady gait with poor forward flex posture as well as flex hips and knees.  Attempted to correct however pt quick to state "I don't want to fall".  HIGH FALL RISK  Follow Up Recommendations  SNF     Equipment Recommendations  None recommended by PT    Recommendations for Other Services       Precautions / Restrictions Precautions Precautions: Fall Restrictions Weight Bearing Restrictions: No    Mobility  Bed Mobility Overal bed mobility: Needs Assistance Bed Mobility: Supine to Sit     Supine to sit: Supervision;Min guard     General bed mobility comments: required increased, increased time   Transfers Overall transfer level: Needs assistance Equipment used: Rolling walker (2 wheeled) Transfers: Sit to/from [facility] Sit to Stand: Min assist;Mod assist Stand pivot transfers: Min assist;Mod assist       General transfer comment: required assist to rise off elevated bed and increased assist off lower level toilet.   Ambulation/Gait   Gait Distance (Feet): 24 Feet(12 feet x 2 to and from bathroom) Assistive device: Rolling walker (2 wheeled) Gait Pattern/deviations: Step-to pattern;Step-through pattern;Shuffle;Trunk flexed Gait velocity: decreased    General Gait Details: very unsteady gait with poor forward flex posture as well as flex hips and knees.  Attempted to correct however pt quick to state "I don't want to fall".      Stairs             Wheelchair Mobility    Modified Rankin (Stroke Patients Only)       Balance                                            Cognition Arousal/Alertness: Awake/alert Behavior During Therapy: WFL for tasks assessed/performed Overall Cognitive Status: Within Functional Limits for tasks assessed                                        Exercises      General Comments        Pertinent Vitals/Pain Pain Assessment: Faces Faces Pain Scale: Hurts a little bit Pain Location: stomach Pain Descriptors / Indicators: Aching    Home Living                      Prior Function            PT Goals (current goals can now be found in the care plan section) Progress towards PT goals: Progressing toward goals    Frequency    Min 2X/week      PT Plan Current plan remains appropriate    Co-evaluation              AM-PAC PT "6 Clicks" Mobility   Outcome Measure  Help needed turning from your back to your side while in a flat bed without using bedrails?: A Little Help needed moving from lying on your back to sitting on the side of a flat bed without using bedrails?: A Little Help needed moving to and from a bed to a chair (including a wheelchair)?: A Little Help needed standing up from a chair using your arms (e.g., wheelchair or bedside chair)?: A Little Help needed to walk in hospital room?: A Little Help needed climbing 3-5 steps with a railing? : A Lot 6 Click Score: 17    End of Session Equipment Utilized During Treatment: Gait belt Activity Tolerance: Other (comment)(limited by self) Patient left: in chair;with chair alarm set;with call bell/phone within reach Nurse Communication: Mobility status PT Visit Diagnosis: Muscle weakness (generalized) (M62.81);Difficulty in walking, not elsewhere classified (R26.2)     Time: 1425-1440 PT Time Calculation (min) (ACUTE ONLY): 15 min  Charges:   $Gait Training: 8-22 mins                     Rica Koyanagi  PTA Acute  Rehabilitation Services Pager      [phone] Office      [phone]

## 2018-11-13 NOTE — Progress Notes (Signed)
PROGRESS NOTE  Margaret Li TKP:[id number] DOB: 03-16-1929 DOA: 11/09/2018 PCP: Darcus Austin, MD  HPI/Recap of past 24 hours:  Patient has an unwitnessed fall last night, she denies pain, denies dysphagia No fever  Assessment/Plan: Principal Problem:   Syncope Active Problems:   Hypertension   Hypothyroidism   Major depressive disorder with psychotic features ([place])   AAA (abdominal aortic aneurysm) ([place])   CKD (chronic kidney disease), stage II   History of CVA (cerebrovascular accident)   Orthostasis   Dementia ([place])   Pressure injury of skin  Syncope;  In the setting of orthostatic hypotension.  Poor oral intake. Treated  with IV fluids. Echo with normal ejection fraction.  No significant valvular abnormality. Continue to hold hydrochlorothiazide. Orthostatic vital normalized.  Stop fluids.   Mild renal insufficiency Continue with IV fluids. Now off ivf  Dysphagia; Patient was noted to have a choking episode.  He reported that she has not been able to eat because food gets stuck in her esophagu to my colleague Dr Tyrell Antonio Esophagogram; esophageal dysmotility, esophageal diverticulum. Started  Protonix. Needs GI follow up outpatient.  She denies dysphagia today, RN reports she has no issues taking meds today  Acute  metabolic encephalopathy;   MRI brain "No acute abnormality. 2. Advanced chronic ischemic microangiopathy. Generalized volume Loss.: She is aaox3 today, + poor memory, no agitation  Dyspnea;  She report SOB on 1/7.  No hypoxia  cxr on 1/7 : Chronic bilateral interstitial prominence. No change from multiple prior exams. Stable elevation left hemidiaphragm.  Mild troponin elevation, denies chest pain, echo no wall motion abnormality EKG with sinus brady, no acute st/tchanges Continue asa/statin/betablocker  HTN;  Blood pressure elevated to day, continue with cozaar and labetalol.  Continue with PRN hydralazine.    AAA Follow-up  recommended.  Hypothyroidism: Continue with Synthroid.  History of CVA Continue with a statin  on aspirin   Dementia with anxiety.  Continue with Namenda Zoloft BuSpar Zyprexa. Aaox3, with poor memory  FTT:  per PT patient is "very unsteady gait with poor forward flex posture as well as flex hips and knees" " high fall risk Will benefit snf Education officer, museum consulted  Code Status: full  Family Communication: patient   Disposition Plan: SNF   Consultants:  none  Procedures:  Dg esophagus   Antibiotics:  none   Objective: BP 125/83 (BP Location: Right Arm)   Pulse (!) 59   Temp 98.1 F (36.7 C)   Resp 18   Ht 5\' 6"  (1.676 m)   Wt 74.8 kg   SpO2 100%   BMI 26.63 kg/m   Intake/Output Summary (Last 24 hours) at 11/13/2018 1718 Last data filed at 11/13/2018 0930 Gross per 24 hour  Intake 3 ml  Output -  Net 3 ml   Filed Weights   11/10/18 0449 11/11/18 0430 11/13/18 0500  Weight: 68 kg 73.9 kg 74.8 kg    Exam: Patient is examined daily including today on 11/13/2018, exams remain the same as of yesterday except that has changed    General:  NAD, aaox3, but poor memory, likely baseline  Cardiovascular: RRR  Respiratory: CTABL  Abdomen: Soft/ND/NT, positive BS  Musculoskeletal: No Edema  Neuro: alert, oriented   Data Reviewed: Basic Metabolic Panel: Recent Labs  Lab 11/09/18 1817 11/10/18 0618  NA 140 140  K 3.4* 3.9  CL 101 104  CO2 29 26  GLUCOSE 123* 109*  BUN 32* 27*  CREATININE 1.04* 0.89  CALCIUM 9.2  8.8*   Liver Function Tests: No results for input(s): AST, ALT, ALKPHOS, BILITOT, PROT, ALBUMIN in the last 168 hours. No results for input(s): LIPASE, AMYLASE in the last 168 hours. No results for input(s): AMMONIA in the last 168 hours. CBC: Recent Labs  Lab 11/09/18 1817  WBC 10.5  NEUTROABS 8.7*  HGB 11.3*  HCT 36.0  MCV 97.3  PLT 186   Cardiac Enzymes:   Recent Labs  Lab 11/12/18 1656 11/12/18 2242 11/13/18 0552    TROPONINI 0.31* 0.26* 0.21*   BNP (last 3 results) Recent Labs    12/02/17 1943  BNP 65.2    ProBNP (last 3 results) No results for input(s): PROBNP in the last 8760 hours.  CBG: Recent Labs  Lab 11/11/18 0432 11/12/18 0554 11/12/18 0726 11/13/18 0502 11/13/18 0807  GLUCAP 109* 103* 107* 105* 99    Recent Results (from the past 240 hour(s))  MRSA PCR Screening     Status: None   Collection Time: 11/09/18  9:53 PM  Result Value Ref Range Status   MRSA by PCR NEGATIVE NEGATIVE Final    Comment:        The GeneXpert MRSA Assay (FDA approved for NASAL specimens only), is one component of a comprehensive MRSA colonization surveillance program. It is not intended to diagnose MRSA infection nor to guide or monitor treatment for MRSA infections. Performed at [facility], [place] [address], [place], [place] [address]      Studies: Mr Brain Wo Contrast  Result Date: 11/12/2018 CLINICAL DATA:  Encephalopathy.  Syncope.  Fall. EXAM: MRI HEAD WITHOUT CONTRAST TECHNIQUE: Multiplanar, multiecho pulse sequences of the brain and surrounding structures were obtained without intravenous contrast. COMPARISON:  CTA head neck 11/09/2018 FINDINGS: BRAIN: There is no acute infarct, acute hemorrhage, hydrocephalus or extra-axial collection. The midline structures are normal. No midline shift or other mass effect. Diffuse confluent hyperintense T2-weighted signal within the periventricular, deep and juxtacortical white matter, most commonly due to chronic ischemic microangiopathy. There is hyperintense T2-weighted signal within the brainstem. Generalized atrophy without lobar predilection. Susceptibility-sensitive sequences show no chronic microhemorrhage or superficial siderosis. VASCULAR: Major intracranial arterial and venous sinus flow voids are normal. SKULL AND UPPER CERVICAL SPINE: Calvarial bone marrow signal is normal. There is no skull base mass. Visualized upper  cervical spine and soft tissues are normal. SINUSES/ORBITS: Right mastoid effusion. Paranasal sinuses are clear. Incidentally noted Tornwaldt cyst. The orbits are normal. IMPRESSION: 1. No acute abnormality. 2. Advanced chronic ischemic microangiopathy. Generalized volume loss. Electronically Signed   By: Ulyses Jarred M.D.   On: 11/12/2018 20:01    Scheduled Meds: . aspirin EC  81 mg Oral Daily  . busPIRone  5 mg Oral BID  . dorzolamide-timolol  1 drop Both Eyes BID  . enoxaparin (LOVENOX) injection  40 mg Subcutaneous QHS  . labetalol  100 mg Oral BID  . levothyroxine  50 mcg Oral QAC breakfast  . losartan  100 mg Oral Daily  . memantine  10 mg Oral BID  . Netarsudil Dimesylate  1 drop Both Eyes QHS  . OLANZapine  7.5 mg Oral QHS  . pantoprazole  40 mg Oral Daily  . polyethylene glycol  17 g Oral Daily  . senna-docusate  1 tablet Oral BID  . sertraline  50 mg Oral Daily  . simvastatin  5 mg Oral q1800  . sodium chloride flush  3 mL Intravenous Q12H    Continuous Infusions:   Time spent: 82mins I have  personally reviewed and interpreted on  11/13/2018 daily labs, tele strips, imagings as discussed above under date review session and assessment and plans.  I reviewed all nursing notes, pharmacy notes,  vitals, pertinent old records  I have discussed plan of care as described above with RN , patient  on 11/13/2018   Florencia Reasons MD, PhD  [facility] Pager [phone]. If 7PM-7AM, please contact night-coverage at [email or web address], password [facility] 11/13/2018, 5:18 PM  LOS: 2 days

## 2018-11-13 NOTE — Progress Notes (Signed)
   11/12/18 2230  What Happened  Was fall witnessed? No  Was patient injured? No  Patient found on floor  Found by Staff-comment (NT on duty)  Stated prior activity to/from bed, chair, or stretcher  Follow Up  MD notified [place]  Time MD notified 2235  Family notified Yes-comment (grandson)  Time family notified 2234  Additional tests No  Progress note created (see row info) Yes

## 2018-11-13 NOTE — Clinical Social Work Note (Signed)
Clinical Social Work Assessment  Patient Details  Name: Margaret Li MRN: [id number] Date of Birth: [birth year]  Date of referral:  11/13/18               Reason for consult:  Discharge Planning                Permission sought to share information with:  Facility Sport and exercise psychologist, Tourist information centre manager, Family Supports Permission granted to share information::  Yes, Verbal Permission Granted  Name::     [employer]  Agency::  [employer]  Relationship::  POA  Contact Information:  Cousin/POA  Housing/Transportation Living arrangements for the past 2 months:  [place] of Information:  [place], Patient Patient Interpreter Needed:  None Criminal Activity/Legal Involvement Pertinent to Current Situation/Hospitalization:  No - Comment as needed Significant Relationships:  Adult Children, Warehouse manager, Other Family Members Lives with:  Facility Resident Do you feel safe going back to the place where you live?  Yes Need for family participation in patient care:  Yes (Comment)  Care giving concerns: No care giving concerns at the time of assessment.   Margaret Li is a 85 y.o. female with medical history significant for hypertension, history of CVA, hypothyroidism, depression with anxiety, and AAA, now presenting to the emergency department from her SNF for evaluation of a syncopal episode.  Patient reports that she was pushing her walker to the lunchroom today when she became acutely lightheaded and had a transient loss of consciousness, falling to the ground.  She is not sure if she hit her head but reports some mild ache in her left jaw.  Patient reports that she is had a poor appetite for approximately 2 days, but no nausea, vomiting, abdominal pain, chest pain, palpitations, cough, shortness of breath, fevers, or chills.  Denies dysuria or flank pain.  No change in vision or hearing or focal numbness or weakness.   Social Worker assessment / plan:  LCSW  consulted for dc planning.   Patient is a resident at [facility]. According to [facility], patients cousin and POA patient has been at the facility for about a year. Margaret Li reports that the facility has been a good fit for patient and that she really enjoys it.   At baseline patient ambulates independently, though she has a walker that she is encouraged to use by family and facility. According to [facility] patient is independent in ADLs and may require mod assistance occassionally.   PT recommends SNF for rehab. Patient and family preference is for patient to return to [facility] with PT. POA plans to contact facility. LCSW explained that when patient is ready to dc facility will review clinicals to determine if they can accept patient at current level of functioning. POA expressed understanding.   PLAN: Return to facility if facility agrees.   Employment status:  Retired Nurse, adult PT Recommendations:  [place] / Referral to community resources:     Patient/Family's Response to care:  Thankful for LCSW coordination with dc.   Patient/Family's Understanding of and Emotional Response to Diagnosis, Current Treatment, and Prognosis:  Family expressed understanding of patients current condition. Family wishes for patient to return to ALF at dc.    Emotional Assessment Appearance:  Appears stated age Attitude/Demeanor/Rapport:  Unable to Assess Affect (typically observed):  Calm Orientation:  Oriented to Self Alcohol / Substance use:  Not Applicable Psych involvement (Current and /or in the community):  No (Comment)  Discharge Needs  Concerns to be addressed:  No discharge needs identified Readmission within the last 30 days:  No Current discharge risk:  None Barriers to Discharge:  Continued Medical Work up   [employer], LCSW 11/13/2018, 10:34 AM

## 2018-11-14 DIAGNOSIS — E876 Hypokalemia: Secondary | ICD-10-CM

## 2018-11-14 LAB — BASIC METABOLIC PANEL
Anion gap: 10 (ref 5–15)
BUN: 31 mg/dL — ABNORMAL HIGH (ref 8–23)
CHLORIDE: 107 mmol/L (ref 98–111)
CO2: 22 mmol/L (ref 22–32)
Calcium: 8.8 mg/dL — ABNORMAL LOW (ref 8.9–10.3)
Creatinine, Ser: 0.84 mg/dL (ref 0.44–1.00)
GFR calc Af Amer: >60 mL/min (ref >60)
GFR calc non Af Amer: >60 mL/min (ref >60)
Glucose, Bld: 94 mg/dL (ref 70–99)
Potassium: 3.5 mmol/L (ref 3.5–5.1)
Sodium: 139 mmol/L (ref 135–145)

## 2018-11-14 LAB — CBC WITH DIFFERENTIAL/PLATELET
Abs Immature Granulocytes: 0.01 10*3/uL (ref 0.00–0.07)
Basophils Absolute: 0 10*3/uL (ref 0.0–0.1)
Basophils Relative: 0 %
EOS PCT: 2 %
Eosinophils Absolute: 0.1 10*3/uL (ref 0.0–0.5)
HCT: 37 % (ref 36.0–46.0)
Hemoglobin: 11.9 g/dL — ABNORMAL LOW (ref 12.0–15.0)
Immature Granulocytes: 0 %
Lymphocytes Relative: 9 %
Lymphs Abs: 0.6 10*3/uL — ABNORMAL LOW (ref 0.7–4.0)
MCH: 30.1 pg (ref 26.0–34.0)
MCHC: 32.2 g/dL (ref 30.0–36.0)
MCV: 93.7 fL (ref 80.0–100.0)
Monocytes Absolute: 1 10*3/uL (ref 0.1–1.0)
Monocytes Relative: 14 %
Neutro Abs: 5.2 10*3/uL (ref 1.7–7.7)
Neutrophils Relative %: 75 %
Platelets: 193 10*3/uL (ref 150–400)
RBC: 3.95 MIL/uL (ref 3.87–5.11)
RDW: 13.4 % (ref 11.5–15.5)
WBC: 6.9 10*3/uL (ref 4.0–10.5)
nRBC: 0 % (ref 0.0–0.2)

## 2018-11-14 LAB — GLUCOSE, CAPILLARY: Glucose-Capillary: 86 mg/dL (ref 70–99)

## 2018-11-14 MED ORDER — MAGNESIUM OXIDE 400 (241.3 MG) MG PO TABS
400.0000 mg | ORAL_TABLET | Freq: Every day | ORAL | Status: AC
  Administered 2018-11-14 – 2018-11-15 (×2): 400 mg via ORAL
  Filled 2018-11-14 (×2): qty 1

## 2018-11-14 MED ORDER — POLYETHYLENE GLYCOL 3350 17 G PO PACK
17.0000 g | PACK | Freq: Two times a day (BID) | ORAL | Status: DC
  Administered 2018-11-14 – 2018-11-15 (×2): 17 g via ORAL
  Filled 2018-11-14 (×2): qty 1

## 2018-11-14 MED ORDER — POTASSIUM CHLORIDE CRYS ER 20 MEQ PO TBCR
40.0000 meq | EXTENDED_RELEASE_TABLET | Freq: Once | ORAL | Status: AC
  Administered 2018-11-14: 40 meq via ORAL
  Filled 2018-11-14: qty 2

## 2018-11-14 NOTE — Progress Notes (Signed)
Physical Therapy Treatment Patient Details Name: Margaret Li MRN: [id number] DOB: 1929/02/22 Today's Date: 11/14/2018    History of Present Illness 85 year old with past medical history significant for hypertension, CVA, hypothyroidism, depression with anxiety and AAA who came to ED after falling after syncopal episode at her facility while ambulating to dining room.    PT Comments    Assisted OOB to amb an increased distance however pt required + 2 assist for unsteady gait.  Pt also present with c/o feeling "dopey" no "dizzy".  Pt will need ST Rehab at SNF.   Follow Up Recommendations  SNF     Equipment Recommendations  None recommended by PT    Recommendations for Other Services       Precautions / Restrictions Precautions Precautions: Fall    Mobility  Bed Mobility Overal bed mobility: Needs Assistance Bed Mobility: Supine to Sit     Supine to sit: Supervision;Min guard     General bed mobility comments: required increased, increased time   Transfers Overall transfer level: Needs assistance Equipment used: Rolling walker (2 wheeled) Transfers: Sit to/from [facility] Sit to Stand: Min assist;Mod assist Stand pivot transfers: Min assist;Mod assist       General transfer comment: required assist to rise off elevated bed and increased assist off lower level toilet.   Ambulation/Gait Ambulation/Gait assistance: Min assist;+2 safety/equipment;+2 physical assistance Gait Distance (Feet): 32 Feet Assistive device: Rolling walker (2 wheeled) Gait Pattern/deviations: Step-to pattern;Step-through pattern;Shuffle;Trunk flexed Gait velocity: decreased    General Gait Details: very unsteady gait with poor forward flex posture as well as flex hips and knees.     Stairs             Wheelchair Mobility    Modified Rankin (Stroke Patients Only)       Balance                                            Cognition  Arousal/Alertness: Awake/alert Behavior During Therapy: WFL for tasks assessed/performed Overall Cognitive Status: Within Functional Limits for tasks assessed                                        Exercises      General Comments        Pertinent Vitals/Pain Pain Assessment: No/denies pain    Home Living                      Prior Function            PT Goals (current goals can now be found in the care plan section) Progress towards PT goals: Progressing toward goals    Frequency    Min 2X/week      PT Plan Current plan remains appropriate    Co-evaluation              AM-PAC PT "6 Clicks" Mobility   Outcome Measure  Help needed turning from your back to your side while in a flat bed without using bedrails?: A Little Help needed moving from lying on your back to sitting on the side of a flat bed without using bedrails?: A Little Help needed moving to and from a bed to a chair (including a wheelchair)?: A Little Help needed  standing up from a chair using your arms (e.g., wheelchair or bedside chair)?: A Little Help needed to walk in hospital room?: A Little Help needed climbing 3-5 steps with a railing? : A Lot 6 Click Score: 17    End of Session Equipment Utilized During Treatment: Gait belt Activity Tolerance: Patient tolerated treatment well Patient left: in chair;with call bell/phone within reach;with chair alarm set Nurse Communication: Mobility status PT Visit Diagnosis: Muscle weakness (generalized) (M62.81);Difficulty in walking, not elsewhere classified (R26.2)     Time: 5992-3414 PT Time Calculation (min) (ACUTE ONLY): 25 min  Charges:  $Gait Training: 8-22 mins $Therapeutic Activity: 8-22 mins                     Rica Koyanagi  PTA Acute  Rehabilitation Services Pager      [phone] Office      [phone]

## 2018-11-14 NOTE — Progress Notes (Signed)
Patients POA spoke with facility who believe they can manage patient at ALF.  POA, Ms. Owens Shark gave facility contact info to LCSW. LCSW will follow up with facility.   LCSW will confirm plan in the morning.   Carolin Coy [place] [facility] [place]

## 2018-11-14 NOTE — Care Management Important Message (Signed)
Important Message  Patient Details  Name: DELORAS REICHARD MRN: [id number] Date of Birth: 13-Feb-1929   Medicare Important Message Given:  Yes    Kerin Salen 11/14/2018, 10:51 [place] Message  Patient Details  Name: SAHIAN KERNEY MRN: [id number] Date of Birth: 1929-07-08   Medicare Important Message Given:  Yes    Kerin Salen 11/14/2018, 10:51 AM

## 2018-11-14 NOTE — Progress Notes (Signed)
Per PT and Attending patient would benefit from SNF prior to returning to ALF.  LCSW updated POA and answered questions regarding SNF vs. ALF.   LCSW faxed patient out to local facilities.   Carolin Coy [place] [facility] [place]

## 2018-11-14 NOTE — Progress Notes (Signed)
PROGRESS NOTE  Margaret Li FVC:[id number] DOB: 01/15/1929 DOA: 11/09/2018 PCP: Darcus Austin, MD (Inactive)  HPI/Recap of past 24 hours:  Remain very weak,  she denies pain, denies dysphagia, oral intake remain poor No fever Reports no bm since in the hospital  Assessment/Plan: Principal Problem:   Syncope Active Problems:   Hypertension   Hypothyroidism   Major depressive disorder with psychotic features ([place])   AAA (abdominal aortic aneurysm) ([place])   CKD (chronic kidney disease), stage II   History of CVA (cerebrovascular accident)   Orthostasis   Dementia ([place])   Pressure injury of skin   Dysphagia  Syncope;  In the setting of orthostatic hypotension.  Poor oral intake. Treated  with IV fluids. Echo with normal ejection fraction.  No significant valvular abnormality. Continue to hold hydrochlorothiazide. Orthostatic vital normalized.  Stop fluids.   Mild renal insufficiency Continue with IV fluids. Now off ivf  Dysphagia; Patient was noted to have a choking episode.  He reported that she has not been able to eat because food gets stuck in her esophagu to my colleague Dr Tyrell Antonio Esophagogram; esophageal dysmotility, esophageal diverticulum. Started  Protonix. Needs GI follow up outpatient.  She denies dysphagia today, RN reports she has no issues taking meds today  Acute  metabolic encephalopathy;   MRI brain "No acute abnormality. 2. Advanced chronic ischemic microangiopathy. Generalized volume Loss.: She is aaox3 today, + poor memory, no agitation  Dyspnea;  She report SOB on 1/7.  No hypoxia  cxr on 1/7 : Chronic bilateral interstitial prominence. No change from multiple prior exams. Stable elevation left hemidiaphragm.  Mild troponin elevation, denies chest pain, echo no wall motion abnormality EKG with sinus brady, no acute st/tchanges Continue asa/statin/betablocker  HTN;  Blood pressure elevated to day, continue with cozaar and labetalol.    Continue with PRN hydralazine.    AAA Follow-up recommended.  Hypothyroidism: Continue with Synthroid.  History of CVA Continue with a statin  on aspirin   Dementia with anxiety.  Continue with Namenda Zoloft BuSpar Zyprexa. Aaox3, with poor memory  FTT:  per PT patient is "very unsteady gait with poor forward flex posture as well as flex hips and knees" " high fall risk Will benefit snf Social worker consulted  Hypokalemia/hypomagnesemia: Replace k/mag. Keep K.4, mag>2  Code Status: full  Family Communication: patient   Disposition Plan: SNF, awaiting for bed   Consultants:  none  Procedures:  Dg esophagus   Antibiotics:  none   Objective: BP 126/80 (BP Location: Right Arm)   Pulse 60   Temp 97.9 F (36.6 C)   Resp 18   Ht 5\' 6"  (1.676 m)   Wt 75.4 kg   SpO2 100%   BMI 26.83 kg/m   Intake/Output Summary (Last 24 hours) at 11/14/2018 1851 Last data filed at 11/14/2018 1305 Gross per 24 hour  Intake 60 ml  Output -  Net 60 ml   Filed Weights   11/11/18 0430 11/13/18 0500 11/14/18 0429  Weight: 73.9 kg 74.8 kg 75.4 kg    Exam: Patient is examined daily including today on 11/14/2018, exams remain the same as of yesterday except that has changed    General:  NAD, aaox3, but poor memory, likely baseline  Cardiovascular: RRR  Respiratory: CTABL  Abdomen: Soft/ND/NT, positive BS  Musculoskeletal: No Edema  Neuro: alert, oriented   Data Reviewed: Basic Metabolic Panel: Recent Labs  Lab 11/09/18 1817 11/10/18 0618 11/14/18 0628  NA 140 140 139  K  3.4* 3.9 3.5  CL 101 104 107  CO2 29 26 22   GLUCOSE 123* 109* 94  BUN 32* 27* 31*  CREATININE 1.04* 0.89 0.84  CALCIUM 9.2 8.8* 8.8*   Liver Function Tests: No results for input(s): AST, ALT, ALKPHOS, BILITOT, PROT, ALBUMIN in the last 168 hours. No results for input(s): LIPASE, AMYLASE in the last 168 hours. No results for input(s): AMMONIA in the last 168 hours. CBC: Recent  Labs  Lab 11/09/18 1817 11/14/18 0628  WBC 10.5 6.9  NEUTROABS 8.7* 5.2  HGB 11.3* 11.9*  HCT 36.0 37.0  MCV 97.3 93.7  PLT 186 193   Cardiac Enzymes:   Recent Labs  Lab 11/12/18 1656 11/12/18 2242 11/13/18 0552  TROPONINI 0.31* 0.26* 0.21*   BNP (last 3 results) Recent Labs    12/02/17 1943  BNP 65.2    ProBNP (last 3 results) No results for input(s): PROBNP in the last 8760 hours.  CBG: Recent Labs  Lab 11/12/18 0554 11/12/18 0726 11/13/18 0502 11/13/18 0807 11/14/18 0543  GLUCAP 103* 107* 105* 99 86    Recent Results (from the past 240 hour(s))  MRSA PCR Screening     Status: None   Collection Time: 11/09/18  9:53 PM  Result Value Ref Range Status   MRSA by PCR NEGATIVE NEGATIVE Final    Comment:        The GeneXpert MRSA Assay (FDA approved for NASAL specimens only), is one component of a comprehensive MRSA colonization surveillance program. It is not intended to diagnose MRSA infection nor to guide or monitor treatment for MRSA infections. Performed at [facility], [place] [address] [place], [place] [address]      Studies: No results found.  Scheduled Meds: . aspirin EC  81 mg Oral Daily  . busPIRone  5 mg Oral BID  . dorzolamide-timolol  1 drop Both Eyes BID  . enoxaparin (LOVENOX) injection  40 mg Subcutaneous QHS  . labetalol  100 mg Oral BID  . levothyroxine  50 mcg Oral QAC breakfast  . losartan  100 mg Oral Daily  . magnesium oxide  400 mg Oral Daily  . memantine  10 mg Oral BID  . Netarsudil Dimesylate  1 drop Both Eyes QHS  . OLANZapine  7.5 mg Oral QHS  . pantoprazole  40 mg Oral Daily  . polyethylene glycol  17 g Oral BID  . senna-docusate  1 tablet Oral BID  . sertraline  50 mg Oral Daily  . simvastatin  5 mg Oral q1800  . sodium chloride flush  3 mL Intravenous Q12H    Continuous Infusions:   Time spent: 48mins I have personally reviewed and interpreted on  11/14/2018 daily labs,  imagings as  discussed above under date review session and assessment and plans.  I reviewed all nursing notes, pharmacy notes,  vitals, pertinent old records  I have discussed plan of care as described above with RN , patient  on 11/14/2018   Florencia Reasons MD, PhD  [facility] Pager [phone]. If 7PM-7AM, please contact night-coverage at [email or web address], password [facility] 11/14/2018, 6:51 PM  LOS: 3 days

## 2018-11-14 NOTE — NC FL2 (Addendum)
[place] LEVEL OF CARE SCREENING TOOL     IDENTIFICATION  Patient Name: Margaret Li Birthdate: 1928-12-12 Sex: female Admission Date (Current Location): 11/09/2018  [facility] and [place] Number:  Herbalist and Address:  [facility],  [place] [address] [place]      Provider Number: [id number]  Attending Physician Name and Address:  Florencia Reasons, MD  Relative Name and Phone Number:       Current Level of Care: Hospital Recommended Level of Care: [place] Prior Approval Number:    Date Approved/Denied:   PASRR Number:   Discharge Plan: ALF    Current Diagnoses: Patient Active Problem List   Diagnosis Date Noted  . Dysphagia   . Pressure injury of skin 11/10/2018  . Syncope 11/09/2018  . AAA (abdominal aortic aneurysm) ([place]) 11/09/2018  . CKD (chronic kidney disease), stage II 11/09/2018  . History of CVA (cerebrovascular accident) 11/09/2018  . Orthostasis 11/09/2018  . Dementia ([place]) 11/09/2018  . Major depressive disorder with psychotic features ([place]) 05/11/2018  . Vision loss, left eye 02/17/2017  . Visual hallucinations 02/17/2017  . Neuropathy 02/17/2017  . Hypertension 06/14/2015  . Hypothyroidism 06/14/2015  . Glaucoma 06/14/2015  . Rectal bleeding 06/13/2015    Orientation RESPIRATION BLADDER Height & Weight     Self  Normal Incontinent Weight: 166 lb 3.6 oz (75.4 kg) Height:  5\' 6"  (167.6 cm)  BEHAVIORAL SYMPTOMS/MOOD NEUROLOGICAL BOWEL NUTRITION STATUS      Incontinent Diet(See dc summary)  AMBULATORY STATUS COMMUNICATION OF NEEDS Skin   Extensive Assist Verbally PU Stage and Appropriate Care(Buttocks) PU Stage 1 Dressing: (Silicone dressing, PRN)                     Personal Care Assistance Level of Assistance  Bathing, Feeding, Dressing Bathing Assistance: Limited assistance Feeding assistance: Independent Dressing Assistance: Limited assistance     Functional Limitations Info   Sight, Hearing, Speech Sight Info: Impaired Hearing Info: Impaired Speech Info: Adequate    SPECIAL CARE FACTORS FREQUENCY  PT (By licensed PT), OT (By licensed OT)     PT Frequency: 5x/week OT Frequency: 5x/week            Contractures Contractures Info: Not present    Additional Factors Info  Code Status, Allergies Code Status Info: Full Allergies Info:  Trazodone And Nefazodone, Clarithromycin, Codeine, Duloxetine Hcl, Escitalopram Oxalate, Fluoxetine, Ibandronic Acid, Sulfa Antibiotics, Hydrochlorothiazide, Norvasc Amlodipine Besylate, Prozac Fluoxetine Hcl, Trazodone Hcl           Current Medications (11/14/2018):  This is the current hospital active medication list Current Facility-Administered Medications  Medication Dose Route Frequency Provider Last Rate Last Dose  . acetaminophen (TYLENOL) tablet 650 mg  650 mg Oral Q6H PRN Opyd, Ilene Qua, MD       Or  . acetaminophen (TYLENOL) suppository 650 mg  650 mg Rectal Q6H PRN Opyd, Ilene Qua, MD      . aspirin EC tablet 81 mg  81 mg Oral Daily Regalado, Belkys A, MD   81 mg at 11/14/18 1020  . busPIRone (BUSPAR) tablet 5 mg  5 mg Oral BID Opyd, Ilene Qua, MD   5 mg at 11/14/18 1021  . dorzolamide-timolol (COSOPT) 22.3-6.8 MG/ML ophthalmic solution 1 drop  1 drop Both Eyes BID Opyd, Ilene Qua, MD   1 drop at 11/14/18 1020  . enoxaparin (LOVENOX) injection 40 mg  40 mg Subcutaneous QHS Opyd, Ilene Qua, MD  40 mg at 11/13/18 2259  . hydrALAZINE (APRESOLINE) injection 10 mg  10 mg Intravenous Q6H PRN Regalado, Belkys A, MD   10 mg at 11/14/18 0435  . labetalol (NORMODYNE) tablet 100 mg  100 mg Oral BID Opyd, Ilene Qua, MD   100 mg at 11/14/18 1018  . levothyroxine (SYNTHROID, LEVOTHROID) tablet 50 mcg  50 mcg Oral QAC breakfast Regalado, Belkys A, MD   50 mcg at 11/14/18 0520  . losartan (COZAAR) tablet 100 mg  100 mg Oral Daily Opyd, Ilene Qua, MD   100 mg at 11/14/18 1018  . magnesium oxide (MAG-OX) tablet 400 mg  400 mg  Oral Daily Florencia Reasons, MD   400 mg at 11/14/18 1019  . memantine (NAMENDA) tablet 10 mg  10 mg Oral BID Opyd, Ilene Qua, MD   10 mg at 11/14/18 1020  . Netarsudil Dimesylate 0.02 % SOLN 1 drop  1 drop Both Eyes QHS Opyd, Ilene Qua, MD   1 drop at 11/11/18 2210  . nitroGLYCERIN (NITROSTAT) SL tablet 0.4 mg  0.4 mg Sublingual Q5 min PRN Regalado, Belkys A, MD      . OLANZapine (ZYPREXA) tablet 7.5 mg  7.5 mg Oral QHS Opyd, Ilene Qua, MD   7.5 mg at 11/13/18 2257  . ondansetron (ZOFRAN) tablet 4 mg  4 mg Oral Q6H PRN Opyd, Ilene Qua, MD       Or  . ondansetron (ZOFRAN) injection 4 mg  4 mg Intravenous Q6H PRN Opyd, Ilene Qua, MD      . pantoprazole (PROTONIX) EC tablet 40 mg  40 mg Oral Daily Regalado, Belkys A, MD   40 mg at 11/14/18 1019  . polyethylene glycol (MIRALAX / GLYCOLAX) packet 17 g  17 g Oral Daily Florencia Reasons, MD   17 g at 11/14/18 1021  . senna-docusate (Senokot-S) tablet 1 tablet  1 tablet Oral BID Florencia Reasons, MD   1 tablet at 11/14/18 1020  . sertraline (ZOLOFT) tablet 50 mg  50 mg Oral Daily Opyd, Ilene Qua, MD   50 mg at 11/14/18 1019  . simvastatin (ZOCOR) tablet 5 mg  5 mg Oral q1800 Opyd, Ilene Qua, MD   5 mg at 11/13/18 1752  . sodium chloride flush (NS) 0.9 % injection 3 mL  3 mL Intravenous Q12H Opyd, Ilene Qua, MD   3 mL at 11/14/18 1021     Discharge Medications: STOP taking these medications   hydrochlorothiazide 25 MG tablet Commonly known as:  HYDRODIURIL   loperamide 2 MG tablet Commonly known as:  IMODIUM A-D     TAKE these medications   acetaminophen 500 MG tablet Commonly known as:  TYLENOL Take 500 mg by mouth every 6 (six) hours as needed for mild pain or headache.   ANTACID MAXIMUM 400 MG chewable tablet Generic drug:  calcium elemental as carbonate Chew 1,000 mg by mouth at bedtime.   aspirin 81 MG EC tablet Take 1 tablet (81 mg total) by mouth daily. Start taking on:  November 16, 2018   bisacodyl 10 MG suppository Commonly known as:   DULCOLAX Place 1 suppository (10 mg total) rectally every Sunday. Start taking on:  November 17, 2018   busPIRone 5 MG tablet Commonly known as:  BUSPAR Take 5 mg by mouth 2 (two) times daily.   dorzolamide-timolol 22.3-6.8 MG/ML ophthalmic solution Commonly known as:  COSOPT Place 1 drop into both eyes 2 (two) times daily.   ENSURE Take 237 mLs by mouth 2 (two)  times daily. Chocolate   fluticasone 50 MCG/ACT nasal spray Commonly known as:  FLONASE Place 1 spray into both nostrils daily.   hydrALAZINE 10 MG tablet Commonly known as:  APRESOLINE Take 1 tablet (10 mg total) by mouth 3 (three) times daily for 30 days.   labetalol 100 MG tablet Commonly known as:  NORMODYNE Take 100 mg by mouth 2 (two) times daily. Hold for SBP <100 or pulse <55   levothyroxine 50 MCG tablet Commonly known as:  SYNTHROID, LEVOTHROID Take 50 mcg by mouth daily before breakfast.   losartan 50 MG tablet Commonly known as:  COZAAR Take 100 mg by mouth daily.   magnesium oxide 400 MG tablet Commonly known as:  MAG-OX Take 1 tablet (400 mg total) by mouth daily.   memantine 10 MG tablet Commonly known as:  NAMENDA Take 10 mg by mouth 2 (two) times daily.   MULTIVITAMIN WOMEN 50+ Tabs Take 1 tablet by mouth daily.   Netarsudil Dimesylate 0.02 % Soln Place 1 drop into both eyes at bedtime. "Rhopressa"   nitroGLYCERIN 0.4 MG SL tablet Commonly known as:  NITROSTAT Place 1 tablet (0.4 mg total) under the tongue every 5 (five) minutes as needed for chest pain.   OLANZapine 7.5 MG tablet Commonly known as:  ZYPREXA Take 7.5 mg by mouth at bedtime.   pantoprazole 40 MG tablet Commonly known as:  PROTONIX Take 1 tablet (40 mg total) by mouth daily. Start taking on:  November 16, 2018   polyethylene glycol packet Commonly known as:  MIRALAX / GLYCOLAX Take 17 g by mouth 2 (two) times daily.   sennosides-docusate sodium 8.6-50 MG tablet Commonly known as:  SENOKOT-S Take  1 tablet by mouth daily.   sertraline 50 MG tablet Commonly known as:  ZOLOFT Take 50 mg by mouth daily.   simvastatin 10 MG tablet Commonly known as:  ZOCOR Take 0.5 tablets (5 mg total) by mouth daily at 6 PM. What changed:  when to take this   vitamin B-12 1000 MCG tablet Commonly known as:  CYANOCOBALAMIN Take 1,000 mcg by mouth daily.     Relevant Imaging Results:  Relevant Lab Results:   Additional Information ssn: [id number] return to ALF with Home Health PT, OT, Speech Therapy, Nurse's Aide, Social Work  Servando Snare, LCSW

## 2018-11-15 LAB — MAGNESIUM: MAGNESIUM: 2.2 mg/dL (ref 1.7–2.4)

## 2018-11-15 LAB — BASIC METABOLIC PANEL
Anion gap: 9 (ref 5–15)
BUN: 28 mg/dL — ABNORMAL HIGH (ref 8–23)
CALCIUM: 8.9 mg/dL (ref 8.9–10.3)
CO2: 23 mmol/L (ref 22–32)
Chloride: 107 mmol/L (ref 98–111)
Creatinine, Ser: 0.78 mg/dL (ref 0.44–1.00)
GFR calc Af Amer: >60 mL/min (ref >60)
GFR calc non Af Amer: >60 mL/min (ref >60)
Glucose, Bld: 107 mg/dL — ABNORMAL HIGH (ref 70–99)
Potassium: 4.1 mmol/L (ref 3.5–5.1)
Sodium: 139 mmol/L (ref 135–145)

## 2018-11-15 LAB — GLUCOSE, CAPILLARY
Glucose-Capillary: 106 mg/dL — ABNORMAL HIGH (ref 70–99)
Glucose-Capillary: 105 mg/dL — ABNORMAL HIGH (ref 70–99)

## 2018-11-15 MED ORDER — NITROGLYCERIN 0.4 MG SL SUBL: 0.4000 mg | SUBLINGUAL_TABLET | SUBLINGUAL | 0 refills | Status: AC | PRN

## 2018-11-15 MED ORDER — SIMVASTATIN 10 MG PO TABS: 5.0000 mg | ORAL_TABLET | Freq: Every day | ORAL | 0 refills | Status: DC

## 2018-11-15 MED ORDER — NITROGLYCERIN 0.4 MG SL SUBL: 0.4000 mg | SUBLINGUAL_TABLET | SUBLINGUAL | 0 refills | Status: DC | PRN

## 2018-11-15 MED ORDER — BISACODYL 10 MG RE SUPP
10.0000 mg | Freq: Every day | RECTAL | Status: DC
  Administered 2018-11-15: 10 mg via RECTAL
  Filled 2018-11-15: qty 1

## 2018-11-15 MED ORDER — HYDRALAZINE HCL 10 MG PO TABS: 10.0000 mg | ORAL_TABLET | Freq: Three times a day (TID) | ORAL | 0 refills | Status: DC

## 2018-11-15 MED ORDER — BISACODYL 10 MG RE SUPP: 10.0000 mg | RECTAL | 0 refills | Status: AC

## 2018-11-15 MED ORDER — MAGNESIUM OXIDE 400 MG PO TABS: 400.0000 mg | ORAL_TABLET | Freq: Every day | ORAL | 0 refills | Status: AC

## 2018-11-15 MED ORDER — POLYETHYLENE GLYCOL 3350 17 G PO PACK: 17.0000 g | PACK | Freq: Two times a day (BID) | ORAL | 0 refills | Status: AC

## 2018-11-15 MED ORDER — ASPIRIN 81 MG PO TBEC: 81.0000 mg | DELAYED_RELEASE_TABLET | Freq: Every day | ORAL | 0 refills | Status: AC

## 2018-11-15 MED ORDER — PANTOPRAZOLE SODIUM 40 MG PO TBEC: 40.0000 mg | DELAYED_RELEASE_TABLET | Freq: Every day | ORAL | 0 refills | Status: AC

## 2018-11-15 NOTE — Clinical Social Work Placement (Addendum)
   Patient returning to [place] at [facility].  LCSW confirmed return with [facility].   LCSW faxed dc docs to facility.   Patient will transport by [facility].  Please call [facility] when facility is ready. [phone] if patient transports before 8pm. If after 8pm call [phone], hit 1, hit 3,  State no emergancy  RN report number: [phone]  [facility]  CLINICAL SOCIAL WORK PLACEMENT  NOTE  Date:  11/15/2018  Patient Details  Name: Margaret Li MRN: [id number] Date of Birth: 05/02/1929  Clinical Social Work is seeking post-discharge placement for this patient at the [place] level of care (*CSW will initial, date and re-position this form in  chart as items are completed):  Yes   Patient/family provided with [place] Work Department's list of facilities offering this level of care within the geographic area requested by the patient (or if unable, by the patient's family).  Yes   Patient/family informed of their freedom to choose among providers that offer the needed level of care, that participate in Medicare, Medicaid or managed care program needed by the patient, have an available bed and are willing to accept the patient.  Yes   Patient/family informed of [place]'s ownership interest in [facility] and [facility], as well as of the fact that they are under no obligation to receive care at these facilities.  PASRR submitted to EDS on       PASRR number received on 11/14/18     Existing PASRR number confirmed on       FL2 transmitted to all facilities in geographic area requested by pt/family on 11/14/18     FL2 transmitted to all facilities within larger geographic area on       Patient informed that his/her managed care company has contracts with or will negotiate with certain facilities, including the following:        No(Returning to ALF)   Patient/family informed of bed offers received.  Patient chooses bed at  [facility]/ [facility]es)     Physician recommends and patient chooses bed at      Patient to be transferred to [facility]) on 11/15/18.  Patient to be transferred to facility by EMS     Patient family notified on 11/15/18 of transfer.  Name of family member notified:  Left Message For POA Ms. Owens Shark     PHYSICIAN       Additional Comment:    _______________________________________________ Servando Snare, LCSW 11/15/2018, 11:51 AM

## 2018-11-15 NOTE — Progress Notes (Signed)
Escorted off unit at 2200 by [facility]  No complaints noted

## 2018-11-15 NOTE — Progress Notes (Signed)
Patient and family would like to return to facility.   LCSW confirmed  Return with [facility] at [facility].   LCSW paged attending to notify of patient and family wishes and facility ability to accept patient.   Carolin Coy [facility] [place]

## 2018-11-15 NOTE — Progress Notes (Signed)
Margaret Li called from facility stating that information had been received and that transportation could now be set up.

## 2018-11-15 NOTE — Progress Notes (Signed)
[facility] on four occasions, and could only get to a voicemail which was full.  Tried number for the unit that patient is on twice, and called the main number twice to speak to someone regarding paperwork faxed.

## 2018-11-15 NOTE — Care Management Note (Signed)
Case Management Note  Patient Details  Name: Margaret Li MRN: [id number] Date of Birth: [birth year]  Subjective/Objective:                  Discharge planning  Action/Plan: [facility] for pt ot aide slp and sw   Expected Discharge Date:  11/15/18               Expected Discharge Plan:  [place]  In-House Referral:     Discharge planning Services  CM Consult  Post Acute Care Choice:    Choice offered to:  Patient  DME Arranged:    DME Agency:     HH Arranged:  PT, OT, Speech Therapy, Nurse's Aide, Social Work [employer] Agency:  [place]  Status of Service:  Completed, signed off  If discussed at [name], dates discussed:    Additional Comments:  Leeroy Cha, RN 11/15/2018, 12:20 PM

## 2018-11-15 NOTE — Discharge Summary (Addendum)
Discharge Summary  Margaret Li CWC:[id number] DOB: May 31, 1929  PCP: Darcus Austin, MD (Inactive)  Admit date: 11/09/2018 Discharge date: 11/15/2018  Time spent: 26mins, more than 50% time spent on coordination of care.  Recommendations for Outpatient Follow-up:  1. F/u with PCP within a week  for hospital discharge follow up, repeat cbc/bmp at follow up. pcp to have goals of care discussion with patient and family, consider refer to palliative care medicine. 2. Family declined snf placement, wants patient go back to ALF, will maximize home health   Discharge Diagnoses:  Active Hospital Problems   Diagnosis Date Noted  . Syncope 11/09/2018  . Hypokalemia   . Hypomagnesemia   . Dysphagia   . Pressure injury of skin 11/10/2018  . AAA (abdominal aortic aneurysm) ([place]) 11/09/2018  . CKD (chronic kidney disease), stage II 11/09/2018  . History of CVA (cerebrovascular accident) 11/09/2018  . Orthostasis 11/09/2018  . Dementia ([place]) 11/09/2018  . Major depressive disorder with psychotic features ([place]) 05/11/2018  . Hypertension 06/14/2015  . Hypothyroidism 06/14/2015    Resolved Hospital Problems  No resolved problems to display.    Discharge Condition: stable  Diet recommendation: heart healthy Must sit up at [place] for meals  Filed Weights   11/11/18 0430 11/13/18 0500 11/14/18 0429  Weight: 73.9 kg 74.8 kg 75.4 kg    History of present illness: (per admitting MD Dr Myna Hidalgo)    Chief Complaint: Syncope   HPI: Margaret Li is a 85 y.o. female with medical history significant for hypertension, history of CVA, hypothyroidism, depression with anxiety, and AAA, now presenting to the emergency department from her SNF for evaluation of a syncopal episode.  Patient reports that she was pushing her walker to the lunchroom today when she became acutely lightheaded and had a transient loss of consciousness, falling to the ground.  She is not sure if she hit her head but reports some  mild ache in her left jaw.  Patient reports that she is had a poor appetite for approximately 2 days, but no nausea, vomiting, abdominal pain, chest pain, palpitations, cough, shortness of breath, fevers, or chills.  Denies dysuria or flank pain.  No change in vision or hearing or focal numbness or weakness.  ED Course: Upon arrival to the ED, patient is found to be saturating low 90s on room air, and with vitals otherwise normal.  EKG features a sinus rhythm with RBBB, LAFB, LVH, similar to prior.  Chest x-ray features minor bibasilar atelectasis.  Chemistry panel with slight hypokalemia and creatinine 1.04, slightly up from priors.  CBC unremarkable and troponin normal.  There was a 24 mmHg drop in systolic blood pressure upon standing in the ED.  Patient remains hemodynamically stable and will be observed for further evaluation and management of syncope, likely due to orthostasis.   Hospital Course:  Principal Problem:   Syncope Active Problems:   Hypertension   Hypothyroidism   Major depressive disorder with psychotic features ([place])   AAA (abdominal aortic aneurysm) (H[facility])   CKD (chronic kidney disease), stage II   History of CVA (cerebrovascular accident)   Orthostasis   Dementia (H[facility])   Pressure injury of skin   Dysphagia   Hypokalemia   Hypomagnesemia   Syncope; In the setting of orthostatic hypotension.  Poor oral intake. CT head/CT cervical spine no acute findings Echo with normal ejection fraction. No significant valvular abnormality. Treatedwith IV fluids. Discontinue hydrochlorothiazide. Orthostatic vital normalized.    Mild renal insufficiency Continue with IV  fluids. Now off ivf  Dysphagia; She reports food gets stuck in her esophagus Esophagogram; esophageal dysmotility, esophageal diverticulum. StartedProtonix. Needs GI follow up outpatient.  Needs to be up at [place] for meal  Chronic calcific pancreatitis. Incidental finding on CT ab/pel She denies  ab pain pcp to consider start pancreatic enzyme supplement   Cholelithiasis.Incidental finding on CT ab/pel No ab pain, no n/v pcp to follow up.  Extensive distal colonic diverticulosis without evidence of diverticulitis No ab pain, prevent constipation.  Hypokalemia/hypomagnesemia: Likely from poor oral intake Replaced and normalized.  Keep K.4, mag>2   Acute metabolic encephalopathy with baseline dementia;   MRI brain "No acute abnormality. 2. Advanced chronic ischemic microangiopathy. Generalized volume Loss.: Improved, back to baseline,  She is aaox3 today, + poor memory, no agitation  Dyspnea;  She report SOB on 1/7.  No hypoxia cxr on 1/7 : Chronic bilateral interstitial prominence. No change from multiple prior exams. Stable elevation left hemidiaphragm.  Mild troponin elevation, denies chest pain, echo no wall motion abnormality EKG with sinus brady, no acute st/tchanges Continue asa/statin/betablocker  HTN;  continue with cozaar and labetalol.  Discontinue HCTz Start hydralazine, pcp to continue titrate bp meds at hospital discharge follow up.   AAA, Incidental findings on Ct ab "Abdominal aortic aneurysm 3.5 x 3.4 cm increased since 2018 when this measured a maximum of 3.0 x 3.0 cm." Follow-up recommended.  Hypothyroidism: Continue with Synthroid.  History of CVA Continue with a statin  on aspirin   Dementia with anxiety. Continue with Namenda Zoloft BuSpar Zyprexa. Aaox3, with poor memory  FTT: per PT patient is "very unsteady gait with poor forward flex posture as well as flex hips and knees" " high fall risk Will benefit snf, family declined SNF, will maximize home health. Social worker input appreciated.   Code Status: full  Family Communication: patient   Disposition Plan: family declined snf, return to ALF , home health ordered   Consultants:  none  Procedures:  Dg esophagus    Antibiotics:  none   Discharge Exam: BP (!) (P) 188/95 (BP Location: Left Arm) Comment: RN notified  Pulse 64   Temp 98 F (36.7 C) (Oral)   Resp 20   Ht 5\' 6"  (1.676 m)   Wt 75.4 kg   SpO2 99%   BMI 26.83 kg/m    General:  NAD, aaox3, but poor memory, likely baseline  Cardiovascular: RRR  Respiratory: CTABL  Abdomen: Soft/ND/NT, positive BS  Musculoskeletal: No Edema  Neuro: alert, oriented    Discharge Instructions You were cared for by a hospitalist during your hospital stay. If you have any questions about your discharge medications or the care you received while you were in the hospital after you are discharged, you can call the unit and asked to speak with the hospitalist on call if the hospitalist that took care of you is not available. Once you are discharged, your primary care physician will handle any further medical issues. Please note that NO REFILLS for any discharge medications will be authorized once you are discharged, as it is imperative that you return to your primary care physician (or establish a relationship with a primary care physician if you do not have one) for your aftercare needs so that they can reassess your need for medications and monitor your lab values.  Discharge Instructions    Diet - low sodium heart healthy   Complete by:  As directed    Must be up at 90 degree  for meal,   Increase activity slowly   Complete by:  As directed      Allergies as of 11/15/2018      Reactions   Trazodone And Nefazodone Anaphylaxis   Clarithromycin    GI side effects Other reaction(s): Other GI side effects   Codeine Other (See Comments)   GI side effects Other reaction(s): Other GI side effects   Duloxetine Hcl    GI side effects Other reaction(s): Other GI side effects   Escitalopram Oxalate Nausea Only, Other (See Comments)   dizziness Other reaction(s): Dizziness   Fluoxetine    Other reaction(s): Other Abnormal weight loss; GI side  effects   Ibandronic Acid    Groin pain Other reaction(s): Other Groin pain   Sulfa Antibiotics Other (See Comments)   Unknown reaction  Other reaction(s): Unknown   Hydrochlorothiazide Other (See Comments)   Increased urination   Norvasc [amlodipine Besylate] Other (See Comments)   Abdominal pain, bad dreams   Prozac [fluoxetine Hcl] Other (See Comments)   Abnormal weight loss, GI upset    Trazodone Hcl Swelling, Rash   Swollen tongue      Medication List    STOP taking these medications   hydrochlorothiazide 25 MG tablet Commonly known as:  HYDRODIURIL   loperamide 2 MG tablet Commonly known as:  IMODIUM A-D     TAKE these medications   acetaminophen 500 MG tablet Commonly known as:  TYLENOL Take 500 mg by mouth every 6 (six) hours as needed for mild pain or headache.   ANTACID MAXIMUM 400 MG chewable tablet Generic drug:  calcium elemental as carbonate Chew 1,000 mg by mouth at bedtime.   aspirin 81 MG EC tablet Take 1 tablet (81 mg total) by mouth daily. Start taking on:  November 16, 2018   bisacodyl 10 MG suppository Commonly known as:  DULCOLAX Place 1 suppository (10 mg total) rectally every Sunday. Start taking on:  November 17, 2018   busPIRone 5 MG tablet Commonly known as:  BUSPAR Take 5 mg by mouth 2 (two) times daily.   dorzolamide-timolol 22.3-6.8 MG/ML ophthalmic solution Commonly known as:  COSOPT Place 1 drop into both eyes 2 (two) times daily.   ENSURE Take 237 mLs by mouth 2 (two) times daily. Chocolate   fluticasone 50 MCG/ACT nasal spray Commonly known as:  FLONASE Place 1 spray into both nostrils daily.   hydrALAZINE 10 MG tablet Commonly known as:  APRESOLINE Take 1 tablet (10 mg total) by mouth 3 (three) times daily for 30 days.   labetalol 100 MG tablet Commonly known as:  NORMODYNE Take 100 mg by mouth 2 (two) times daily. Hold for SBP <100 or pulse <55   levothyroxine 50 MCG tablet Commonly known as:  SYNTHROID,  LEVOTHROID Take 50 mcg by mouth daily before breakfast.   losartan 50 MG tablet Commonly known as:  COZAAR Take 100 mg by mouth daily.   magnesium oxide 400 MG tablet Commonly known as:  MAG-OX Take 1 tablet (400 mg total) by mouth daily.   memantine 10 MG tablet Commonly known as:  NAMENDA Take 10 mg by mouth 2 (two) times daily.   MULTIVITAMIN WOMEN 50+ Tabs Take 1 tablet by mouth daily.   Netarsudil Dimesylate 0.02 % Soln Place 1 drop into both eyes at bedtime. "Rhopressa"   nitroGLYCERIN 0.4 MG SL tablet Commonly known as:  NITROSTAT Place 1 tablet (0.4 mg total) under the tongue every 5 (five) minutes as needed for chest pain (  q23mins up to three times).   OLANZapine 7.5 MG tablet Commonly known as:  ZYPREXA Take 7.5 mg by mouth at bedtime.   pantoprazole 40 MG tablet Commonly known as:  PROTONIX Take 1 tablet (40 mg total) by mouth daily. Start taking on:  November 16, 2018   polyethylene glycol packet Commonly known as:  MIRALAX / GLYCOLAX Take 17 g by mouth 2 (two) times daily.   sennosides-docusate sodium 8.6-50 MG tablet Commonly known as:  SENOKOT-S Take 1 tablet by mouth daily.   sertraline 50 MG tablet Commonly known as:  ZOLOFT Take 50 mg by mouth daily.   simvastatin 10 MG tablet Commonly known as:  ZOCOR Take 0.5 tablets (5 mg total) by mouth daily at 6 PM. What changed:  when to take this   vitamin B-12 1000 MCG tablet Commonly known as:  CYANOCOBALAMIN Take 1,000 mcg by mouth daily.      Allergies  Allergen Reactions  . Trazodone And Nefazodone Anaphylaxis  . Clarithromycin     GI side effects Other reaction(s): Other GI side effects  . Codeine Other (See Comments)    GI side effects Other reaction(s): Other GI side effects  . Duloxetine Hcl     GI side effects Other reaction(s): Other GI side effects  . Escitalopram Oxalate Nausea Only and Other (See Comments)    dizziness Other reaction(s): Dizziness  . Fluoxetine     Other  reaction(s): Other Abnormal weight loss; GI side effects  . Ibandronic Acid     Groin pain Other reaction(s): Other Groin pain  . Sulfa Antibiotics Other (See Comments)    Unknown reaction  Other reaction(s): Unknown  . Hydrochlorothiazide Other (See Comments)    Increased urination   . Norvasc [Amlodipine Besylate] Other (See Comments)    Abdominal pain, bad dreams  . Prozac [Fluoxetine Hcl] Other (See Comments)    Abnormal weight loss, GI upset   . Trazodone Hcl Swelling and Rash    Swollen tongue    Follow-up Information    Darcus Austin, MD Follow up in 1 week(s).   Specialty:  Family Medicine Why:  hospital discharge follow up, repeat cbc/bmp at follow up.  Contact information: [address] [place] [address] [phone]            The results of significant diagnostics from this hospitalization (including imaging, microbiology, ancillary and laboratory) are listed below for reference.    Significant Diagnostic Studies: Dg Chest 2 View  Result Date: 11/09/2018 CLINICAL DATA:  Syncope. EXAM: CHEST - 2 VIEW COMPARISON:  Radiograph 05/13/2018. FINDINGS: Heart is normal in size. Atherosclerotic tortuous thoracic aorta. Minor bibasilar atelectasis. No confluent airspace disease. No pleural effusion or pneumothorax. No pulmonary edema. No acute osseous abnormalities are seen. IMPRESSION: 1. Minor bibasilar atelectasis. 2.  Aortic Atherosclerosis (ICD10-I70.0). Electronically Signed   By: Keith Rake M.D.   On: 11/09/2018 19:50   Ct Head Wo Contrast  Result Date: 11/09/2018 CLINICAL DATA:  Fall.  Complaining of headache and neck pain. EXAM: CT HEAD WITHOUT CONTRAST CT CERVICAL SPINE WITHOUT CONTRAST TECHNIQUE: Multidetector CT imaging of the head and cervical spine was performed following the standard protocol without intravenous contrast. Multiplanar CT image reconstructions of the cervical spine were also generated. COMPARISON:  03/04/2018  FINDINGS: CT HEAD FINDINGS Brain: No evidence of acute infarction, hemorrhage, hydrocephalus, extra-axial collection or mass lesion/mass effect. There is age appropriate volume loss. Bilateral white matter hypoattenuation is noted consistent with moderate chronic microvascular ischemic  change. Vascular: No hyperdense vessel or unexpected calcification. Skull: Normal. Negative for fracture or focal lesion. Sinuses/Orbits: Globes and orbits are unremarkable. Visualized sinuses and mastoid air cells are clear. Other: None. CT CERVICAL SPINE FINDINGS Alignment: Normal. Skull base and vertebrae: No acute fracture. No primary bone lesion or focal pathologic process. Soft tissues and spinal canal: No prevertebral fluid or swelling. No visible canal hematoma. Disc levels: Mild loss of disc height at C2-C3 and C3-C4. Moderate loss of disc height at C4-C5 with marked loss of disc height at C5-C6 and C6-C7 spondylotic disc bulging with endplate spurring is noted at these levels. There are facet degenerative changes bilaterally. No convincing disc herniation. Upper chest: No mass or adenopathy. No acute findings. Lung apices are clear. Other: None. IMPRESSION: HEAD CT 1. No acute intracranial abnormalities. 2. Age-related volume loss. Moderate chronic microvascular ischemic change. CERVICAL CT 1. No fracture or acute finding Electronically Signed   By: Lajean Manes M.D.   On: 11/09/2018 19:38   Ct Cervical Spine Wo Contrast  Result Date: 11/09/2018 CLINICAL DATA:  Fall.  Complaining of headache and neck pain. EXAM: CT HEAD WITHOUT CONTRAST CT CERVICAL SPINE WITHOUT CONTRAST TECHNIQUE: Multidetector CT imaging of the head and cervical spine was performed following the standard protocol without intravenous contrast. Multiplanar CT image reconstructions of the cervical spine were also generated. COMPARISON:  03/04/2018 FINDINGS: CT HEAD FINDINGS Brain: No evidence of acute infarction, hemorrhage, hydrocephalus, extra-axial  collection or mass lesion/mass effect. There is age appropriate volume loss. Bilateral white matter hypoattenuation is noted consistent with moderate chronic microvascular ischemic change. Vascular: No hyperdense vessel or unexpected calcification. Skull: Normal. Negative for fracture or focal lesion. Sinuses/Orbits: Globes and orbits are unremarkable. Visualized sinuses and mastoid air cells are clear. Other: None. CT CERVICAL SPINE FINDINGS Alignment: Normal. Skull base and vertebrae: No acute fracture. No primary bone lesion or focal pathologic process. Soft tissues and spinal canal: No prevertebral fluid or swelling. No visible canal hematoma. Disc levels: Mild loss of disc height at C2-C3 and C3-C4. Moderate loss of disc height at C4-C5 with marked loss of disc height at C5-C6 and C6-C7 spondylotic disc bulging with endplate spurring is noted at these levels. There are facet degenerative changes bilaterally. No convincing disc herniation. Upper chest: No mass or adenopathy. No acute findings. Lung apices are clear. Other: None. IMPRESSION: HEAD CT 1. No acute intracranial abnormalities. 2. Age-related volume loss. Moderate chronic microvascular ischemic change. CERVICAL CT 1. No fracture or acute finding Electronically Signed   By: Lajean Manes M.D.   On: 11/09/2018 19:38   Mr Brain Wo Contrast  Result Date: 11/12/2018 CLINICAL DATA:  Encephalopathy.  Syncope.  Fall. EXAM: MRI HEAD WITHOUT CONTRAST TECHNIQUE: Multiplanar, multiecho pulse sequences of the brain and surrounding structures were obtained without intravenous contrast. COMPARISON:  CTA head neck 11/09/2018 FINDINGS: BRAIN: There is no acute infarct, acute hemorrhage, hydrocephalus or extra-axial collection. The midline structures are normal. No midline shift or other mass effect. Diffuse confluent hyperintense T2-weighted signal within the periventricular, deep and juxtacortical white matter, most commonly due to chronic ischemic microangiopathy.  There is hyperintense T2-weighted signal within the brainstem. Generalized atrophy without lobar predilection. Susceptibility-sensitive sequences show no chronic microhemorrhage or superficial siderosis. VASCULAR: Major intracranial arterial and venous sinus flow voids are normal. SKULL AND UPPER CERVICAL SPINE: Calvarial bone marrow signal is normal. There is no skull base mass. Visualized upper cervical spine and soft tissues are normal. SINUSES/ORBITS: Right mastoid effusion. Paranasal sinuses are clear.  Incidentally noted Tornwaldt cyst. The orbits are normal. IMPRESSION: 1. No acute abnormality. 2. Advanced chronic ischemic microangiopathy. Generalized volume loss. Electronically Signed   By: Ulyses Jarred M.D.   On: 11/12/2018 20:01   Ct Abdomen Pelvis W Contrast  Result Date: 11/09/2018 CLINICAL DATA:  Abdominal distension, history abdominal aortic aneurysm, fall, cholelithiasis, hypertension, GERD EXAM: CT ABDOMEN AND PELVIS WITH CONTRAST TECHNIQUE: Multidetector CT imaging of the abdomen and pelvis was performed using the standard protocol following bolus administration of intravenous contrast. Sagittal and coronal MPR images reconstructed from axial data set. CONTRAST:  132mL ISOVUE-300 IOPAMIDOL (ISOVUE-300) INJECTION 61% IV. No oral contrast. COMPARISON:  04/26/2017 FINDINGS: Lower chest: Bibasilar atelectasis Hepatobiliary: Dependent calculi in gallbladder. No definite gallbladder wall thickening or biliary dilatation. Liver unremarkable. Pancreas: Atrophic pancreas with multiple tiny parenchymal calcifications consistent with chronic calcific pancreatitis. No definite pancreatic mass or ductal dilatation. Spleen: Normal appearance Adrenals/Urinary Tract: Thickening of adrenal glands without discrete mass. BILATERAL renal cysts, LEFT 3.0 x 2.6 cm image 24 and RIGHT 6.6 x 6.0 cm image 33. No additional renal mass or hydronephrosis. No urinary tract calcification. Bladder decompressed. Stomach/Bowel:  Extensive diverticulosis of distal colon. Large duodenal diverticulum. Stomach decompressed. Appendix not localized. Remaining bowel loops grossly normal appearance without dilatation, wall thickening or obstruction. Swirled appearance of the mesenteric vessels in the small bowel mesentery but no evidence of bowel obstruction or wall thickening to suggest a small bowel volvulus. Increased stool in rectum. Vascular/Lymphatic: Extensive atherosclerotic calcifications of aorta, iliac arteries, visceral arteries, coronary arteries. Tortuous and aneurysmal abdominal aorta measuring up to 3.5 x 3.4 cm distally, with aneurysmal dilatation extending into the common iliac arteries, proximal RIGHT common iliac artery 2.1 cm and proximal LEFT common iliac artery 1.6 cm. Narrowing of the celiac artery and SMA origins by calcified plaque. No adenopathy. Reproductive: Uterus surgically absent. Other: No free air or free fluid. BILATERAL breast prostheses noted. No definite inflammatory process. Musculoskeletal: Beam hardening artifacts in pelvis from RIGHT hip prosthesis. Diffuse osseous demineralization. Degenerative disc disease changes of the thoracolumbar spine. Chronic inferior endplate compression fracture of L1 unchanged. IMPRESSION: Extensive distal colonic diverticulosis without evidence of diverticulitis. Cholelithiasis. Abdominal aortic aneurysm 3.5 x 3.4 cm increased since 2018 when this measured a maximum of 3.0 x 3.0 cm. BILATERAL renal cysts. Chronic calcific pancreatitis. Electronically Signed   By: Lavonia Dana M.D.   On: 11/09/2018 19:49   Dg Chest Port 1 View  Result Date: 11/12/2018 CLINICAL DATA:  Shortness of breath. EXAM: PORTABLE CHEST 1 VIEW COMPARISON:  Chest x-ray 11/09/2017, 05/13/2018. FINDINGS: Mediastinum hilar structures are stable. Heart size stable. Chronic bilateral interstitial prominence noted. No change from multiple prior exams. Stable elevation left hemidiaphragm. No acute bony  abnormality. Stable thoracic spine scoliosis. IMPRESSION: Chronic bilateral interstitial prominence. No change from multiple prior exams. Stable elevation left hemidiaphragm. Electronically Signed   By: Freeman   On: 11/12/2018 11:40   Dg Esophagus Inc Scout Chest & Delayed Img Single Cm (ba Or Sol)  Result Date: 11/11/2018 CLINICAL DATA:  Dysphasia during breakfast. Patient reports food gets stuck in throat. EXAM: ESOPHOGRAM/BARIUM SWALLOW TECHNIQUE: Single contrast examination was performed using  thin barium. FLUOROSCOPY TIME:  Fluoroscopy Time:  1 minutes 36 seconds Radiation Exposure Index (if provided by the fluoroscopic device): 7.5 mGy Number of Acquired Spot Images: 1 COMPARISON:  None. FINDINGS: A limited single contrast exam was performed with the patient in the semi upright LPO orientation. The patient ingested barium through a straw. The  esophagus is patent. There is no high-grade stricture or mass identified. There is marked esophageal dysmotility with multiple tertiary waves identified throughout the swallowing mechanism. Within the distal esophagus there is a paraesophageal diverticulum measuring approximately 1.6 cm. IMPRESSION: 1. Marked esophageal dysmotility. 2. Distal esophageal diverticulum. Electronically Signed   By: Kerby Moors M.D.   On: 11/11/2018 15:23    Microbiology: Recent Results (from the past 240 hour(s))  MRSA PCR Screening     Status: None   Collection Time: 11/09/18  9:53 PM  Result Value Ref Range Status   MRSA by PCR NEGATIVE NEGATIVE Final    Comment:        The GeneXpert MRSA Assay (FDA approved for NASAL specimens only), is one component of a comprehensive MRSA colonization surveillance program. It is not intended to diagnose MRSA infection nor to guide or monitor treatment for MRSA infections. Performed at [facility], [place] [address] [place], [place] [address]      Labs: Basic Metabolic Panel: Recent Labs  Lab  11/09/18 1817 11/10/18 0618 11/14/18 0628 11/15/18 0723  NA 140 140 139 139  K 3.4* 3.9 3.5 4.1  CL 101 104 107 107  CO2 29 26 22 23   GLUCOSE 123* 109* 94 107*  BUN 32* 27* 31* 28*  CREATININE 1.04* 0.89 0.84 0.78  CALCIUM 9.2 8.8* 8.8* 8.9  MG  --   --   --  2.2   Liver Function Tests: No results for input(s): AST, ALT, ALKPHOS, BILITOT, PROT, ALBUMIN in the last 168 hours. No results for input(s): LIPASE, AMYLASE in the last 168 hours. No results for input(s): AMMONIA in the last 168 hours. CBC: Recent Labs  Lab 11/09/18 1817 11/14/18 0628  WBC 10.5 6.9  NEUTROABS 8.7* 5.2  HGB 11.3* 11.9*  HCT 36.0 37.0  MCV 97.3 93.7  PLT 186 193   Cardiac Enzymes: Recent Labs  Lab 11/12/18 1656 11/12/18 2242 11/13/18 0552  TROPONINI 0.31* 0.26* 0.21*   BNP: BNP (last 3 results) Recent Labs    12/02/17 1943  BNP 65.2    ProBNP (last 3 results) No results for input(s): PROBNP in the last 8760 hours.  CBG: Recent Labs  Lab 11/13/18 0502 11/13/18 0807 11/14/18 0543 11/15/18 0700 11/15/18 0752  GLUCAP 105* 99 86 106* 105*       Signed:  Florencia Reasons MD, PhD  [facility] Hospitalists 11/15/2018, 2:39 PM

## 2018-11-15 NOTE — Progress Notes (Addendum)
LCSW faxed updated info as requested by facility.  LCSW called facility to confirm receipt of dc summary and FL2. LCSW left message for Iman.   Patient can transport once facility reviews documents and confirm return.   LCSW updated [employer].   LCSW spoke with Med tech Lelan Pons who stated she was not aware that patient cannot return. Lelan Pons states that is changes have been made patient should be able to return. She is going to follow up and call the floor directly when patient can transport.   Floor RN will call [facility].  Carolin Coy [facility] [place]

## 2018-11-15 NOTE — Progress Notes (Signed)
Report called to [place] at [facility].  Iman requesting that FL2 be updated with Cosigned FL2, and Home Health orders as well and resent prior to patient transport.  Iman also requesting that nitroglycerine order be updated with maximum doses.

## 2018-11-17 DIAGNOSIS — R627 Adult failure to thrive: Secondary | ICD-10-CM | POA: Diagnosis not present

## 2018-11-17 DIAGNOSIS — R1310 Dysphagia, unspecified: Secondary | ICD-10-CM | POA: Diagnosis not present

## 2018-11-17 DIAGNOSIS — I1310 Hypertensive heart and chronic kidney disease without heart failure, with stage 1 through stage 4 chronic kidney disease, or unspecified chronic kidney disease: Secondary | ICD-10-CM | POA: Diagnosis not present

## 2018-11-17 DIAGNOSIS — R69 Illness, unspecified: Secondary | ICD-10-CM | POA: Diagnosis not present

## 2018-11-17 DIAGNOSIS — E039 Hypothyroidism, unspecified: Secondary | ICD-10-CM | POA: Diagnosis not present

## 2018-11-17 DIAGNOSIS — I951 Orthostatic hypotension: Secondary | ICD-10-CM | POA: Diagnosis not present

## 2018-11-17 DIAGNOSIS — I714 Abdominal aortic aneurysm, without rupture: Secondary | ICD-10-CM | POA: Diagnosis not present

## 2018-11-17 DIAGNOSIS — N182 Chronic kidney disease, stage 2 (mild): Secondary | ICD-10-CM | POA: Diagnosis not present

## 2018-11-17 DIAGNOSIS — E538 Deficiency of other specified B group vitamins: Secondary | ICD-10-CM | POA: Diagnosis not present

## 2018-11-17 DIAGNOSIS — K224 Dyskinesia of esophagus: Secondary | ICD-10-CM | POA: Diagnosis not present

## 2018-11-21 DIAGNOSIS — R2681 Unsteadiness on feet: Secondary | ICD-10-CM | POA: Diagnosis not present

## 2018-11-21 DIAGNOSIS — Z9181 History of falling: Secondary | ICD-10-CM | POA: Diagnosis not present

## 2018-11-22 DIAGNOSIS — R2681 Unsteadiness on feet: Secondary | ICD-10-CM | POA: Diagnosis not present

## 2018-11-22 DIAGNOSIS — Z9181 History of falling: Secondary | ICD-10-CM | POA: Diagnosis not present

## 2018-11-28 ENCOUNTER — Emergency Department (HOSPITAL_COMMUNITY): Payer: Medicare HMO

## 2018-11-28 ENCOUNTER — Encounter (HOSPITAL_COMMUNITY): Payer: Self-pay

## 2018-11-28 ENCOUNTER — Emergency Department (HOSPITAL_COMMUNITY)
Admission: EM | Admit: 2018-11-28 | Discharge: 2018-11-29 | Disposition: A | Discharge status: Skilled Nursing Facility | Payer: Medicare HMO | Attending: Emergency Medicine | Admitting: Emergency Medicine

## 2018-11-28 ENCOUNTER — Other Ambulatory Visit: Payer: Self-pay

## 2018-11-28 DIAGNOSIS — L98491 Non-pressure chronic ulcer of skin of other sites limited to breakdown of skin: Secondary | ICD-10-CM | POA: Diagnosis not present

## 2018-11-28 DIAGNOSIS — Z7982 Long term (current) use of aspirin: Secondary | ICD-10-CM | POA: Diagnosis not present

## 2018-11-28 DIAGNOSIS — L97111 Non-pressure chronic ulcer of right thigh limited to breakdown of skin: Secondary | ICD-10-CM | POA: Diagnosis not present

## 2018-11-28 DIAGNOSIS — F0390 Unspecified dementia without behavioral disturbance: Secondary | ICD-10-CM | POA: Insufficient documentation

## 2018-11-28 DIAGNOSIS — N182 Chronic kidney disease, stage 2 (mild): Secondary | ICD-10-CM | POA: Diagnosis not present

## 2018-11-28 DIAGNOSIS — I129 Hypertensive chronic kidney disease with stage 1 through stage 4 chronic kidney disease, or unspecified chronic kidney disease: Secondary | ICD-10-CM | POA: Insufficient documentation

## 2018-11-28 DIAGNOSIS — Z79899 Other long term (current) drug therapy: Secondary | ICD-10-CM | POA: Insufficient documentation

## 2018-11-28 DIAGNOSIS — R69 Illness, unspecified: Secondary | ICD-10-CM | POA: Diagnosis not present

## 2018-11-28 DIAGNOSIS — E860 Dehydration: Secondary | ICD-10-CM | POA: Diagnosis not present

## 2018-11-28 DIAGNOSIS — I959 Hypotension, unspecified: Secondary | ICD-10-CM | POA: Diagnosis not present

## 2018-11-28 DIAGNOSIS — R42 Dizziness and giddiness: Secondary | ICD-10-CM | POA: Diagnosis not present

## 2018-11-28 DIAGNOSIS — L8995 Pressure ulcer of unspecified site, unstageable: Secondary | ICD-10-CM | POA: Diagnosis not present

## 2018-11-28 DIAGNOSIS — R531 Weakness: Secondary | ICD-10-CM | POA: Diagnosis not present

## 2018-11-28 DIAGNOSIS — E039 Hypothyroidism, unspecified: Secondary | ICD-10-CM | POA: Diagnosis not present

## 2018-11-28 DIAGNOSIS — Z87891 Personal history of nicotine dependence: Secondary | ICD-10-CM | POA: Insufficient documentation

## 2018-11-28 LAB — URINALYSIS, ROUTINE W REFLEX MICROSCOPIC
Glucose, UA: NEGATIVE mg/dL
Hgb urine dipstick: NEGATIVE
Ketones, ur: 5 mg/dL — AB
Leukocytes, UA: NEGATIVE
Nitrite: NEGATIVE
Protein, ur: NEGATIVE mg/dL
SPECIFIC GRAVITY, URINE: 1.029 (ref 1.005–1.030)
pH: 5 (ref 5.0–8.0)

## 2018-11-28 LAB — CBC WITH DIFFERENTIAL/PLATELET
Abs Immature Granulocytes: 0.03 10*3/uL (ref 0.00–0.07)
Basophils Absolute: 0 10*3/uL (ref 0.0–0.1)
Basophils Relative: 0 %
Eosinophils Absolute: 0.1 10*3/uL (ref 0.0–0.5)
Eosinophils Relative: 1 %
HCT: 35.1 % — ABNORMAL LOW (ref 36.0–46.0)
HEMOGLOBIN: 10.7 g/dL — AB (ref 12.0–15.0)
Immature Granulocytes: 0 %
LYMPHS PCT: 10 %
Lymphs Abs: 0.8 10*3/uL (ref 0.7–4.0)
MCH: 30.3 pg (ref 26.0–34.0)
MCHC: 30.5 g/dL (ref 30.0–36.0)
MCV: 99.4 fL (ref 80.0–100.0)
Monocytes Absolute: 1 10*3/uL (ref 0.1–1.0)
Monocytes Relative: 13 %
NRBC: 0 % (ref 0.0–0.2)
Neutro Abs: 5.8 10*3/uL (ref 1.7–7.7)
Neutrophils Relative %: 76 %
Platelets: 202 10*3/uL (ref 150–400)
RBC: 3.53 MIL/uL — ABNORMAL LOW (ref 3.87–5.11)
RDW: 14.2 % (ref 11.5–15.5)
WBC: 7.8 10*3/uL (ref 4.0–10.5)

## 2018-11-28 LAB — I-STAT TROPONIN, ED: Troponin i, poc: 0.07 ng/mL (ref 0.00–0.08)

## 2018-11-28 LAB — BASIC METABOLIC PANEL
Anion gap: 7 (ref 5–15)
BUN: 36 mg/dL — ABNORMAL HIGH (ref 8–23)
CHLORIDE: 108 mmol/L (ref 98–111)
CO2: 28 mmol/L (ref 22–32)
CREATININE: 0.84 mg/dL (ref 0.44–1.00)
Calcium: 8.8 mg/dL — ABNORMAL LOW (ref 8.9–10.3)
GFR calc Af Amer: >60 mL/min (ref >60)
GFR calc non Af Amer: >60 mL/min (ref >60)
Glucose, Bld: 97 mg/dL (ref 70–99)
Potassium: 3.7 mmol/L (ref 3.5–5.1)
Sodium: 143 mmol/L (ref 135–145)

## 2018-11-28 NOTE — ED Provider Notes (Addendum)
[facility] DEPT Provider Note   CSN: [id number] Arrival date & time: 11/28/18  1305     History   Chief Complaint Chief Complaint  Patient presents with  . Weakness    HPI Margaret Li is a 85 y.o. female presenting today from skilled nursing facility per staff there for weakness, anorexia and hypotension.  Upon EMS arrival patient was alert and oriented and can walk to the stretcher and apparently had just finished eating some of her breakfast.  Patient does have history of dementia however is alert and oriented here.  Patient states that Margaret Li is feeling well today and is without pain.  Margaret Li denies history of fever or recent illness.  Margaret Li denies cough, chest pain, abdominal pain, nausea/vomiting or diarrhea.  Patient was noted to have superficial skin breakdown of the right hip. HPI  Past Medical History:  Diagnosis Date  . AAA (abdominal aortic aneurysm) ([place])    infrarenal, 3.2 cm. seen on CTA Dec 2018  . Anxiety   . Aortic calcification (HCC)   . Black tarry stools    "per patient"  . Cholelithiasis   . Chronic low back pain   . Depression   . Diverticular disease 1999   h/o diverticular block   . GERD (gastroesophageal reflux disease)   . Glaucoma   . H/O hyperthyroidism   . Hypercholesterolemia   . Hypertension   . Hypothyroidism   . Migraine   . Osteoarthritis   . Osteoporosis   . Tubular adenoma    h/o  . Vitamin B 12 deficiency     Patient Active Problem List   Diagnosis Date Noted  . Hypokalemia   . Hypomagnesemia   . Dysphagia   . Pressure injury of skin 11/10/2018  . Syncope 11/09/2018  . AAA (abdominal aortic aneurysm) ([place]) 11/09/2018  . CKD (chronic kidney disease), stage II 11/09/2018  . History of CVA (cerebrovascular accident) 11/09/2018  . Orthostasis 11/09/2018  . Dementia ([place]) 11/09/2018  . Major depressive disorder with psychotic features ([place]) 05/11/2018  . Vision loss, left eye 02/17/2017  .  Visual hallucinations 02/17/2017  . Neuropathy 02/17/2017  . Hypertension 06/14/2015  . Hypothyroidism 06/14/2015  . Glaucoma 06/14/2015  . Rectal bleeding 06/13/2015    Past Surgical History:  Procedure Laterality Date  . ABDOMINAL HYSTERECTOMY     80's  . BREAST ENHANCEMENT SURGERY    . CATARACT EXTRACTION, BILATERAL    . ESOPHAGOGASTRODUODENOSCOPY (EGD) WITH PROPOFOL N/A 08/04/2016   Procedure: ESOPHAGOGASTRODUODENOSCOPY (EGD) WITH PROPOFOL;  Surgeon: Carol Ada, MD;  Location: [facility] ENDOSCOPY;  Service: Endoscopy;  Laterality: N/A;  . HERNIA REPAIR Right    Inguinal hernia repair '12 -Dr. Rise Patience  . JOINT REPLACEMENT Right    -RTHA -4 '11 - Dr. [name]- [facility]     OB History   No obstetric history on file.      Home Medications    Prior to Admission medications   Medication Sig Start Date End Date Taking? Authorizing Provider  acetaminophen (TYLENOL) 500 MG tablet Take 500 mg by mouth every 6 (six) hours as needed for mild pain or headache.   Yes [provider]  aspirin EC 81 MG EC tablet Take 1 tablet (81 mg total) by mouth daily. 11/16/18  Yes Florencia Reasons, MD  bisacodyl (DULCOLAX) 10 MG suppository Place 1 suppository (10 mg total) rectally every Sunday. 11/17/18  Yes Florencia Reasons, MD  busPIRone (BUSPAR) 5 MG tablet Take 5 mg by mouth 2 (two)  times daily.   Yes [provider]  calcium elemental as carbonate (ANTACID MAXIMUM) 400 MG chewable tablet Chew 1,000 mg by mouth at bedtime.   Yes [provider]  dorzolamide-timolol (COSOPT) 22.3-6.8 MG/ML ophthalmic solution Place 1 drop into both eyes 2 (two) times daily.   Yes [provider]  ENSURE (ENSURE) Take 237 mLs by mouth 2 (two) times daily. Chocolate   Yes [provider]  fluticasone (FLONASE) 50 MCG/ACT nasal spray Place 1 spray into both nostrils daily.   Yes [provider]  hydrALAZINE (APRESOLINE) 10 MG tablet Take 1 tablet (10 mg total) by mouth 3 (three) times  daily for 30 days. 11/15/18 12/15/18 Yes Florencia Reasons, MD  labetalol (NORMODYNE) 100 MG tablet Take 100 mg by mouth 2 (two) times daily. Hold for SBP <100 or pulse <55   Yes [provider]  levothyroxine (SYNTHROID, LEVOTHROID) 50 MCG tablet Take 50 mcg by mouth daily before breakfast.    Yes [provider]  losartan (COZAAR) 100 MG tablet Take 100 mg by mouth at bedtime.  06/01/15  Yes [provider]  magnesium oxide (MAG-OX) 400 MG tablet Take 1 tablet (400 mg total) by mouth daily. 11/15/18  Yes Florencia Reasons, MD  memantine (NAMENDA) 10 MG tablet Take 10 mg by mouth 2 (two) times daily.   Yes [provider]  Multiple Vitamins-Minerals (MULTIVITAMIN WOMEN 50+) TABS Take 1 tablet by mouth daily.   Yes [provider]  Netarsudil Dimesylate 0.02 % SOLN Place 1 drop into both eyes at bedtime. "Rhopressa" 09/07/17  Yes [provider]  nitroGLYCERIN (NITROSTAT) 0.4 MG SL tablet Place 1 tablet (0.4 mg total) under the tongue every 5 (five) minutes as needed for chest pain (q67mins up to three times). 11/15/18  Yes Florencia Reasons, MD  OLANZapine (ZYPREXA) 7.5 MG tablet Take 7.5 mg by mouth at bedtime.   Yes [provider]  pantoprazole (PROTONIX) 40 MG tablet Take 1 tablet (40 mg total) by mouth daily. 11/16/18  Yes Florencia Reasons, MD  polyethylene glycol [facility] Floria Raveling) packet Take 17 g by mouth 2 (two) times daily. 11/15/18  Yes Florencia Reasons, MD  sennosides-docusate sodium (SENOKOT-S) 8.6-50 MG tablet Take 1 tablet by mouth daily.   Yes [provider]  sertraline (ZOLOFT) 50 MG tablet Take 50 mg by mouth daily.   Yes [provider]  simvastatin (ZOCOR) 5 MG tablet Take 5 mg by mouth daily at 6 PM.   Yes [provider]  vitamin B-12 (CYANOCOBALAMIN) 1000 MCG tablet Take 1,000 mcg by mouth daily.   Yes [provider]  simvastatin (ZOCOR) 10 MG tablet Take 0.5 tablets (5 mg total) by mouth daily at 6 PM. Patient not taking:  Reported on 11/28/2018 11/15/18   Florencia Reasons, MD    Family History Family History  Problem Relation Age of Onset  . Breast cancer Sister        half sister  . Heart disease Brother        half brother    Social History Social History   Tobacco Use  . Smoking status: Former Smoker    Types: Cigarettes    Last attempt to quit: 08/01/2011    Years since quitting: 7.3  . Smokeless tobacco: Never Used  Substance Use Topics  . Alcohol use: No  . Drug use: No     Allergies   Trazodone and nefazodone; Clarithromycin; Codeine; Duloxetine hcl; Escitalopram oxalate; Fluoxetine; Ibandronic acid; Sulfa antibiotics;  Norvasc [amlodipine besylate]; Prozac [fluoxetine hcl]; and Trazodone hcl   Review of Systems Review of Systems  Constitutional: Negative.  Negative for chills and fever.  Respiratory: Negative.  Negative for cough and shortness of breath.   Cardiovascular: Negative.  Negative for chest pain.  Gastrointestinal: Negative.  Negative for abdominal pain, blood in stool, diarrhea, nausea and vomiting.  Genitourinary: Negative.  Negative for dysuria and hematuria.  Musculoskeletal: Negative.  Negative for arthralgias and myalgias.  Skin: Positive for wound (Right hip).  Neurological: Negative.  Negative for dizziness, syncope, weakness and headaches.  All other systems reviewed and are negative.  Physical Exam Updated Vital Signs BP 122/82   Pulse 64   Temp 97.8 F (36.6 C)   Resp 16   Ht 5\' 6"  (1.676 m)   Wt 75 kg   SpO2 99%   BMI 26.69 kg/m   Physical Exam Constitutional:      General: Margaret Li is not in acute distress.    Appearance: Normal appearance. Margaret Li is well-developed. Margaret Li is not ill-appearing or diaphoretic.  HENT:     Head: Normocephalic and atraumatic.     Right Ear: Tympanic membrane, ear canal and external ear normal.     Left Ear: Tympanic membrane, ear canal and external ear normal.     Nose: Nose normal.     Mouth/Throat:     Mouth: Mucous membranes  are moist.     Pharynx: Oropharynx is clear.      Comments: The patient has normal phonation and is in control of secretions. No stridor.  Midline uvula without edema. Soft palate rises symmetrically. Tongue protrusion is normal, floor of mouth is soft. No trismus. No creptius on neck palpation. Patient missing most all of right lower teeth, states that this makes it harder for her to eat. No gingival erythema or fluctuance noted. Mucus membranes moist.  Eyes:     Extraocular Movements: Extraocular movements intact.     Conjunctiva/sclera: Conjunctivae normal.     Pupils: Pupils are equal, round, and reactive to light.  Neck:     Musculoskeletal: Normal range of motion and neck supple.     Trachea: Trachea normal. No tracheal deviation.  Cardiovascular:     Rate and Rhythm: Normal rate and regular rhythm.     Pulses: Normal pulses.          Dorsalis pedis pulses are 2+ on the right side and 2+ on the left side.       Posterior tibial pulses are 2+ on the right side and 2+ on the left side.     Heart sounds: Normal heart sounds.  Pulmonary:     Effort: Pulmonary effort is normal. No respiratory distress.     Breath sounds: Normal breath sounds. No rhonchi.  Abdominal:     Palpations: Abdomen is soft.     Tenderness: There is no abdominal tenderness. There is no guarding or rebound.  Musculoskeletal: Normal range of motion.     Right lower leg: Normal.     Left lower leg: Normal.     Comments: Patient moving all extremity spontaneously.  Hips stable to compression bilaterally, and Margaret Li is able to bring knees towards chest bilaterally without pain.  No midline C/T/L spinal tenderness to palpation, no paraspinal muscle tenderness, no deformity, crepitus, or step-off noted. No sign of injury to the neck or back.  Feet:     Right foot:     Protective Sensation: 3 sites tested. 3 sites sensed.  Left foot:     Protective Sensation: 3 sites tested. 3 sites sensed.  Skin:    General:  Skin is warm and dry.     Capillary Refill: Capillary refill takes less than 2 seconds.          Comments: 3 cm diameter area of superficial skin breakdown of the right hip.  Picture below  Neurological:     General: No focal deficit present.     Mental Status: Margaret Li is alert and oriented to person, place, and time.     GCS: GCS eye subscore is 4. GCS verbal subscore is 5. GCS motor subscore is 6.     Comments: Mental Status: Alert, oriented, thought content appropriate, able to give a coherent history. Speech fluent without evidence of aphasia. Able to follow 2 step commands without difficulty. Cranial Nerves: II: Peripheral visual fields grossly normal, pupils equal, round, reactive to light III,IV, VI: ptosis not present, extra-ocular motions intact bilaterally V,VII: smile symmetric, eyebrows raise symmetric, facial light touch sensation equal VIII: hearing grossly normal to voice X: uvula elevates symmetrically XI: bilateral shoulder shrug symmetric and strong XII: midline tongue extension without fassiculations Motor: Normal tone. 5/5 strength in upper and lower extremities bilaterally including strong and equal grip strength and dorsiflexion/plantar flexion Sensory: Sensation intact to light touch in all extremities. Cerebellar: normal finger-to-nose with bilateral upper extremities. Normal heel-to -shin balance bilaterally of the lower extremity. No pronator drift.  CV: distal pulses palpable throughout  Psychiatric:        Mood and Affect: Mood normal.        Behavior: Behavior normal. Behavior is cooperative.      ED Treatments / Results  Labs (all labs ordered are listed, but only abnormal results are displayed) Labs Reviewed  CBC WITH DIFFERENTIAL/PLATELET - Abnormal; Notable for the following components:      Result Value   RBC 3.53 (*)    Hemoglobin 10.7 (*)    HCT 35.1 (*)    All other components within normal limits  BASIC METABOLIC PANEL - Abnormal;  Notable for the following components:   BUN 36 (*)    Calcium 8.8 (*)    All other components within normal limits  URINALYSIS, ROUTINE W REFLEX MICROSCOPIC - Abnormal; Notable for the following components:   Color, Urine AMBER (*)    APPearance HAZY (*)    Bilirubin Urine SMALL (*)    Ketones, ur 5 (*)    All other components within normal limits  I-STAT TROPONIN, ED    EKG EKG Interpretation  Date/Time:  Thursday November 28 2018 13:22:01 EST Ventricular Rate:  60 PR Interval:    QRS Duration: 136 QT Interval:  548 QTC Calculation: 548 R Axis:   -83 Text Interpretation:  Sinus or ectopic atrial rhythm RBBB and LAFB Probable left ventricular hypertrophy Non-specific ST-t changes Otherwise no significant change Confirmed by Duffy Bruce [phone]) on 11/28/2018 3:31:01 PM   Radiology Ct Head Wo Contrast  Result Date: 11/28/2018 CLINICAL DATA:  Dementia with weakness and decreased p.o. intake. EXAM: CT HEAD WITHOUT CONTRAST TECHNIQUE: Contiguous axial images were obtained from the base of the skull through the vertex without intravenous contrast. COMPARISON:  11/09/2018 and MRI brain 11/12/2018 FINDINGS: Brain: Ventricles, cisterns and other CSF spaces are within normal. There is moderate chronic ischemic microvascular disease and mild age related atrophic change. There is no mass, mass effect, shift of midline structures or acute hemorrhage. No evidence of acute infarction. Calcifications over the left  basal ganglia. Vascular: No hyperdense vessel or unexpected calcification. Skull: Normal. Negative for fracture or focal lesion. Sinuses/Orbits: Hypoplastic frontal sinuses. Remaining paranasal sinuses and mastoid air cells are clear. Orbits are normal. Other: None. IMPRESSION: No acute findings. Moderate chronic ischemic microvascular disease and age related atrophic change. Electronically Signed   By: Marin Olp M.D.   On: 11/28/2018 17:32   Dg Chest Portable 1 View  Result Date:  11/28/2018 CLINICAL DATA:  Hypotension EXAM: PORTABLE CHEST 1 VIEW COMPARISON:  11/12/2018 FINDINGS: Cardiac shadow is stable. Tortuosity of the thoracic aorta is noted. Patient is somewhat rotated accentuating the mediastinal markings. No focal infiltrate or effusion is seen. No acute bony abnormality is noted. IMPRESSION: No acute abnormality seen. Electronically Signed   By: Inez Catalina M.D.   On: 11/28/2018 15:42    Procedures Procedures (including critical care time)  Medications Ordered in ED Medications - No data to display   Initial Impression / Assessment and Plan / ED Course  I have reviewed the triage vital signs and the nursing notes.  Pertinent labs & imaging results that were available during my care of the patient were reviewed by me and considered in my medical decision making (see chart for details).  Clinical Course as of Nov 28 2344  Thu Nov 28, 2018  1657 CT head, boost shakes at facility and dentist   [BM]    Clinical Course User Index [BM] Deliah Boston, PA-C   CBC with hemoglobin of 10.7, nonacute BMP nonacute Chest x-ray negative EKG reviewed by Dr. Ellender Hose Troponin negative -------------- Patient seen and evaluated by Dr. Ellender Hose, advises CT head, pending normal CT head and noninfectious urinalysis discharge back to facility with nutrition shakes and dentist follow-up. -------------- CT head without acute findings Urinalysis nonacute/noninfectious ------------ Patient has been monitored in emergency department for multiple hours, resting comfortably and in no acute distress.  Margaret Li was alert and oriented throughout visit.  Margaret Li has been eating and drinking without assistance, nausea or vomiting.  Patient ambulated by nursing staff without problem.  Vital signs of remained stable. ------------ Case rediscussed with Dr. Thurnell Garbe who has taken over at shift change, imaging reviewed; agrees with previous plan, nursing facility to follow-up on skin breakdown of  right hip.  No antibiotics indicated at this time. ------ Reassess multiple times, resting comfortably and without distress. Patient reports feeling well and without complaint. Patient asking to return back to facility. --------- At this time there does not appear to be any evidence of an acute emergency medical condition and the patient appears stable for discharge with appropriate outpatient follow up.  Diagnosis was discussed with the patient, return precautions also discussed.  Due to patient's history of dementia I have also provided diagnosis, care plan including skin care and dentist, and return precautions on patient's AVS for her to take back to facility.  All questions answered.  Patient's case rediscussed with Dr. Thurnell Garbe who agrees with plan to discharge with follow-up.   Note: Portions of this report may have been transcribed using voice recognition software. Every effort was made to ensure accuracy; however, inadvertent computerized transcription errors may still be present. Final Clinical Impressions(s) / ED Diagnoses   Final diagnoses:  Generalized weakness  Skin ulcer of hip, right, limited to breakdown of skin [facility])    ED Discharge Orders    None       Gari Crown 11/28/18 2305    Deliah Boston, PA-C 11/28/18 2310  Gari Crown 11/28/18 Arnoldo Lenis    Duffy Bruce, MD 11/30/18 2003

## 2018-11-28 NOTE — ED Notes (Signed)
Pt given sandwich, graham crackers, and sprite

## 2018-11-28 NOTE — ED Notes (Signed)
[facility] called for transport.  

## 2018-11-28 NOTE — ED Notes (Signed)
Pt ambulated to hall and back with assistance. Pt was a little unsteady but able to make it back the bed.

## 2018-11-28 NOTE — ED Triage Notes (Addendum)
Pt BIB EMS from [facility]. Facility called out saying she was weak, not eating and drinking, and low BP. Pt was ambulatory with EMS. Pt ate half of breakfast this morning from what EMS could tell. Pt has small pressure ulcer to left hip. Pt has hx of dementia but A&O x4 with EMS.  110/70 HR 60 98% RA CBG 151

## 2018-11-28 NOTE — Discharge Instructions (Addendum)
You have been diagnosed today with generalized weakness and skin breakdown of the right hip.  At this time there does not appear to be the presence of an emergent medical condition, however there is always the potential for conditions to change. Please read and follow the below instructions.  Please return to the Emergency Department immediately for any new or worsening symptoms. Please be sure to follow up with your Primary Care Provider this week regarding your visit today; please call their office to schedule an appointment even if you are feeling better for a follow-up visit. Please assist the patient with her skin breakdown of her right hip to ensure that this does not worsen.  Turn the patient often and dressed appropriately. Please have the patient seen by your facilities medical provider for reevaluation tomorrow. Please see that the patient is evaluated by a dentist due to her complaints of "teeth not feeling right" additionally she may benefit from nutrition shakes.  Seek immediate medical care if: You have a lot of bleeding. You see a sudden change in the color or texture of the drainage. The wound breaks open. You have severe pain. You have signs of infection, such as: More redness, swelling, or pain. More fluid or blood. Warmth. Pus or a bad smell. Red streaks leading from wound. A fever. Get help right away if: You feel confused. Your vision is blurry. You feel faint or you pass out. You have a severe headache. You have severe pain in your abdomen, your back, or the area between your waist and hips (pelvis). You have chest pain, shortness of breath, or an irregular or fast heartbeat. You are unable to urinate, or you urinate less than normal. You have abnormal bleeding, such as bleeding from the rectum, vagina, nose, lungs, or nipples. You vomit blood. You have thoughts about hurting yourself or others.  Please read the additional information packets attached to your  discharge summary.  Do not take your medicine if  develop an itchy rash, swelling in your mouth or lips, or difficulty breathing.

## 2018-11-28 NOTE — ED Notes (Signed)
Pt given applesauce and is able to keep it down without difficulty.

## 2018-11-28 NOTE — ED Notes (Signed)
Bed: ZB01 Expected date:  Expected time:  Means of arrival:  Comments: EMS from facillity weakness low BP 110/70?

## 2018-11-29 DIAGNOSIS — R531 Weakness: Secondary | ICD-10-CM | POA: Diagnosis not present

## 2018-11-29 DIAGNOSIS — M2550 Pain in unspecified joint: Secondary | ICD-10-CM | POA: Diagnosis not present

## 2018-11-29 DIAGNOSIS — Z7401 Bed confinement status: Secondary | ICD-10-CM | POA: Diagnosis not present

## 2018-11-29 DIAGNOSIS — R5381 Other malaise: Secondary | ICD-10-CM | POA: Diagnosis not present

## 2018-11-30 ENCOUNTER — Encounter (HOSPITAL_COMMUNITY): Payer: Self-pay

## 2018-11-30 ENCOUNTER — Emergency Department (HOSPITAL_COMMUNITY)
Admission: EM | Admit: 2018-11-30 | Discharge: 2018-12-01 | Disposition: A | Discharge status: Home / Self Care | Payer: Medicare HMO | Attending: Emergency Medicine | Admitting: Emergency Medicine

## 2018-11-30 ENCOUNTER — Other Ambulatory Visit: Payer: Self-pay

## 2018-11-30 ENCOUNTER — Emergency Department (HOSPITAL_COMMUNITY): Payer: Medicare HMO

## 2018-11-30 DIAGNOSIS — I129 Hypertensive chronic kidney disease with stage 1 through stage 4 chronic kidney disease, or unspecified chronic kidney disease: Secondary | ICD-10-CM | POA: Diagnosis not present

## 2018-11-30 DIAGNOSIS — J90 Pleural effusion, not elsewhere classified: Secondary | ICD-10-CM | POA: Diagnosis not present

## 2018-11-30 DIAGNOSIS — Z87891 Personal history of nicotine dependence: Secondary | ICD-10-CM | POA: Diagnosis not present

## 2018-11-30 DIAGNOSIS — R531 Weakness: Secondary | ICD-10-CM | POA: Diagnosis not present

## 2018-11-30 DIAGNOSIS — R69 Illness, unspecified: Secondary | ICD-10-CM | POA: Diagnosis not present

## 2018-11-30 DIAGNOSIS — R4182 Altered mental status, unspecified: Secondary | ICD-10-CM | POA: Diagnosis present

## 2018-11-30 DIAGNOSIS — N182 Chronic kidney disease, stage 2 (mild): Secondary | ICD-10-CM | POA: Diagnosis not present

## 2018-11-30 DIAGNOSIS — F0390 Unspecified dementia without behavioral disturbance: Secondary | ICD-10-CM | POA: Insufficient documentation

## 2018-11-30 DIAGNOSIS — E039 Hypothyroidism, unspecified: Secondary | ICD-10-CM | POA: Insufficient documentation

## 2018-11-30 DIAGNOSIS — R509 Fever, unspecified: Secondary | ICD-10-CM | POA: Diagnosis not present

## 2018-11-30 DIAGNOSIS — Z7982 Long term (current) use of aspirin: Secondary | ICD-10-CM | POA: Insufficient documentation

## 2018-11-30 DIAGNOSIS — Z79899 Other long term (current) drug therapy: Secondary | ICD-10-CM | POA: Insufficient documentation

## 2018-11-30 DIAGNOSIS — Z96641 Presence of right artificial hip joint: Secondary | ICD-10-CM | POA: Insufficient documentation

## 2018-11-30 DIAGNOSIS — R5381 Other malaise: Secondary | ICD-10-CM | POA: Diagnosis not present

## 2018-11-30 LAB — CBC WITH DIFFERENTIAL/PLATELET
Abs Immature Granulocytes: 0.03 10*3/uL (ref 0.00–0.07)
Basophils Absolute: 0 10*3/uL (ref 0.0–0.1)
Basophils Relative: 0 %
EOS PCT: 1 %
Eosinophils Absolute: 0.1 10*3/uL (ref 0.0–0.5)
HCT: 33.1 % — ABNORMAL LOW (ref 36.0–46.0)
Hemoglobin: 10.2 g/dL — ABNORMAL LOW (ref 12.0–15.0)
Immature Granulocytes: 0 %
Lymphocytes Relative: 11 %
Lymphs Abs: 0.8 10*3/uL (ref 0.7–4.0)
MCH: 30.6 pg (ref 26.0–34.0)
MCHC: 30.8 g/dL (ref 30.0–36.0)
MCV: 99.4 fL (ref 80.0–100.0)
Monocytes Absolute: 0.9 10*3/uL (ref 0.1–1.0)
Monocytes Relative: 12 %
Neutro Abs: 5.5 10*3/uL (ref 1.7–7.7)
Neutrophils Relative %: 76 %
Platelets: 197 10*3/uL (ref 150–400)
RBC: 3.33 MIL/uL — ABNORMAL LOW (ref 3.87–5.11)
RDW: 14.2 % (ref 11.5–15.5)
WBC: 7.3 10*3/uL (ref 4.0–10.5)
nRBC: 0 % (ref 0.0–0.2)

## 2018-11-30 LAB — COMPREHENSIVE METABOLIC PANEL
ALT: 11 U/L (ref 0–44)
AST: 17 U/L (ref 15–41)
Albumin: 3.6 g/dL (ref 3.5–5.0)
Alkaline Phosphatase: 75 U/L (ref 38–126)
Anion gap: 6 (ref 5–15)
BUN: 36 mg/dL — ABNORMAL HIGH (ref 8–23)
CO2: 27 mmol/L (ref 22–32)
Calcium: 8.7 mg/dL — ABNORMAL LOW (ref 8.9–10.3)
Chloride: 111 mmol/L (ref 98–111)
Creatinine, Ser: 0.8 mg/dL (ref 0.44–1.00)
GFR calc Af Amer: >60 mL/min (ref >60)
GFR calc non Af Amer: >60 mL/min (ref >60)
Glucose, Bld: 104 mg/dL — ABNORMAL HIGH (ref 70–99)
Potassium: 3.6 mmol/L (ref 3.5–5.1)
Sodium: 144 mmol/L (ref 135–145)
TOTAL PROTEIN: 6.3 g/dL — AB (ref 6.5–8.1)
Total Bilirubin: 1.2 mg/dL (ref 0.3–1.2)

## 2018-11-30 LAB — URINALYSIS, ROUTINE W REFLEX MICROSCOPIC
Glucose, UA: NEGATIVE mg/dL
HGB URINE DIPSTICK: NEGATIVE
Ketones, ur: 5 mg/dL — AB
Leukocytes, UA: NEGATIVE
Nitrite: NEGATIVE
Protein, ur: NEGATIVE mg/dL
Specific Gravity, Urine: 1.031 — ABNORMAL HIGH (ref 1.005–1.030)
pH: 5 (ref 5.0–8.0)

## 2018-11-30 LAB — LACTIC ACID, PLASMA: Lactic Acid, Venous: 0.7 mmol/L (ref 0.5–1.9)

## 2018-11-30 LAB — CBG MONITORING, ED: Glucose-Capillary: 92 mg/dL (ref 70–99)

## 2018-11-30 MED ORDER — SODIUM CHLORIDE 0.9% FLUSH
3.0000 mL | Freq: Once | INTRAVENOUS | Status: AC
  Administered 2018-11-30: 3 mL via INTRAVENOUS

## 2018-11-30 MED ORDER — LACTATED RINGERS IV BOLUS
1000.0000 mL | Freq: Once | INTRAVENOUS | Status: AC
  Administered 2018-11-30: 1000 mL via INTRAVENOUS

## 2018-11-30 NOTE — ED Notes (Signed)
Bed: WA08 Expected date:  Expected time:  Means of arrival:  Comments: 85 yo weakness, fever

## 2018-11-30 NOTE — ED Provider Notes (Signed)
[facility] DEPT Provider Note   CSN: [id number] Arrival date & time: 11/30/18  1658     History   Chief Complaint Chief Complaint  Patient presents with  . Altered Mental Status    HPI Margaret Li is a 85 y.o. female.  85yo F w/ PMH including dementia, AAA, GERD, HTN, HLD, anxiety/depression who p/w weakness. PT sent from nursing facility where they reported she has been weak and less responsive today, sleeping more than usual. She reportedly had fever by EMS. For me, she denies any pain, breathing problems, or nausea. Of note, she was sent to ED 2 days ago for complaints of weakness, anorexia. Her work up at that time was reassuring and she was discharged.  Per facility caregiver: She went to check on her this evening and noted that she was talking but didn't want to get out of bed. She reports pt hasn't been the same since hospitalization a few weeks ago. She states she used to walk around a lot but has been less active since return from hospitalization.  LEVEL 5 CAVEAT DUE TO DEMENTIA  The history is provided by the nursing home.    Past Medical History:  Diagnosis Date  . AAA (abdominal aortic aneurysm) ([place])    infrarenal, 3.2 cm. seen on CTA Dec 2018  . Anxiety   . Aortic calcification (HCC)   . Black tarry stools    "per patient"  . Cholelithiasis   . Chronic low back pain   . Depression   . Diverticular disease 1999   h/o diverticular block   . GERD (gastroesophageal reflux disease)   . Glaucoma   . H/O hyperthyroidism   . Hypercholesterolemia   . Hypertension   . Hypothyroidism   . Migraine   . Osteoarthritis   . Osteoporosis   . Tubular adenoma    h/o  . Vitamin B 12 deficiency     Patient Active Problem List   Diagnosis Date Noted  . Hypokalemia   . Hypomagnesemia   . Dysphagia   . Pressure injury of skin 11/10/2018  . Syncope 11/09/2018  . AAA (abdominal aortic aneurysm) ([place]) 11/09/2018  . CKD (chronic kidney  disease), stage II 11/09/2018  . History of CVA (cerebrovascular accident) 11/09/2018  . Orthostasis 11/09/2018  . Dementia ([place]) 11/09/2018  . Major depressive disorder with psychotic features ([place]) 05/11/2018  . Vision loss, left eye 02/17/2017  . Visual hallucinations 02/17/2017  . Neuropathy 02/17/2017  . Hypertension 06/14/2015  . Hypothyroidism 06/14/2015  . Glaucoma 06/14/2015  . Rectal bleeding 06/13/2015    Past Surgical History:  Procedure Laterality Date  . ABDOMINAL HYSTERECTOMY     80's  . BREAST ENHANCEMENT SURGERY    . CATARACT EXTRACTION, BILATERAL    . ESOPHAGOGASTRODUODENOSCOPY (EGD) WITH PROPOFOL N/A 08/04/2016   Procedure: ESOPHAGOGASTRODUODENOSCOPY (EGD) WITH PROPOFOL;  Surgeon: Carol Ada, MD;  Location: [facility];  Service: Endoscopy;  Laterality: N/A;  . HERNIA REPAIR Right    Inguinal hernia repair '12 -Dr. Rise Patience  . JOINT REPLACEMENT Right    -RTHA -4 '11 - Dr. [name]- [facility]     OB History   No obstetric history on file.      Home Medications    Prior to Admission medications   Medication Sig Start Date End Date Taking? Authorizing Provider  acetaminophen (TYLENOL) 500 MG tablet Take 500 mg by mouth every 6 (six) hours as needed for mild pain or headache.   Yes [provider]  aspirin EC 81 MG EC tablet Take 1 tablet (81 mg total) by mouth daily. 11/16/18  Yes Florencia Reasons, MD  bisacodyl (DULCOLAX) 10 MG suppository Place 1 suppository (10 mg total) rectally every Sunday. 11/17/18  Yes Florencia Reasons, MD  busPIRone (BUSPAR) 5 MG tablet Take 5 mg by mouth 2 (two) times daily.   Yes [provider]  calcium elemental as carbonate (ANTACID MAXIMUM) 400 MG chewable tablet Chew 1,000 mg by mouth at bedtime.   Yes [provider]  dorzolamide-timolol (COSOPT) 22.3-6.8 MG/ML ophthalmic solution Place 1 drop into both eyes 2 (two) times daily.   Yes [provider]  ENSURE (ENSURE) Take 237 mLs by mouth 2 (two) times daily.  Chocolate   Yes [provider]  fluticasone (FLONASE) 50 MCG/ACT nasal spray Place 1 spray into both nostrils daily.   Yes [provider]  hydrALAZINE (APRESOLINE) 10 MG tablet Take 1 tablet (10 mg total) by mouth 3 (three) times daily for 30 days. 11/15/18 12/15/18 Yes Florencia Reasons, MD  labetalol (NORMODYNE) 100 MG tablet Take 100 mg by mouth 2 (two) times daily. Hold for SBP <100 or pulse <55   Yes [provider]  levothyroxine (SYNTHROID, LEVOTHROID) 50 MCG tablet Take 50 mcg by mouth daily before breakfast.    Yes [provider]  losartan (COZAAR) 100 MG tablet Take 100 mg by mouth at bedtime.  06/01/15  Yes [provider]  magnesium oxide (MAG-OX) 400 MG tablet Take 1 tablet (400 mg total) by mouth daily. 11/15/18  Yes Florencia Reasons, MD  memantine (NAMENDA) 10 MG tablet Take 10 mg by mouth 2 (two) times daily.   Yes [provider]  Multiple Vitamins-Minerals (MULTIVITAMIN WOMEN 50+) TABS Take 1 tablet by mouth daily.   Yes [provider]  Netarsudil Dimesylate 0.02 % SOLN Place 1 drop into both eyes at bedtime. "Rhopressa" 09/07/17  Yes [provider]  nitroGLYCERIN (NITROSTAT) 0.4 MG SL tablet Place 1 tablet (0.4 mg total) under the tongue every 5 (five) minutes as needed for chest pain (q41mins up to three times). 11/15/18  Yes Florencia Reasons, MD  OLANZapine (ZYPREXA) 7.5 MG tablet Take 7.5 mg by mouth at bedtime.   Yes [provider]  pantoprazole (PROTONIX) 40 MG tablet Take 1 tablet (40 mg total) by mouth daily. 11/16/18  Yes Florencia Reasons, MD  polyethylene glycol [facility] / Floria Raveling) packet Take 17 g by mouth 2 (two) times daily. 11/15/18  Yes Florencia Reasons, MD  sennosides-docusate sodium (SENOKOT-S) 8.6-50 MG tablet Take 1 tablet by mouth daily.   Yes [provider]  sertraline (ZOLOFT) 50 MG tablet Take 50 mg by mouth daily.   Yes [provider]  simvastatin (ZOCOR) 5 MG tablet Take 5 mg by mouth daily at 6 PM.    Yes [provider]  vitamin B-12 (CYANOCOBALAMIN) 1000 MCG tablet Take 1,000 mcg by mouth daily.   Yes [provider]  simvastatin (ZOCOR) 10 MG tablet Take 0.5 tablets (5 mg total) by mouth daily at 6 PM. Patient not taking: Reported on 11/28/2018 11/15/18   Florencia Reasons, MD    Family History Family History  Problem Relation Age of Onset  . Breast cancer Sister        half sister  . Heart disease Brother        half brother    Social History Social History   Tobacco Use  . Smoking status: Former Smoker    Types: Cigarettes  Last attempt to quit: 08/01/2011    Years since quitting: 7.3  . Smokeless tobacco: Never Used  Substance Use Topics  . Alcohol use: No  . Drug use: No     Allergies   Trazodone and nefazodone; Clarithromycin; Codeine; Duloxetine hcl; Escitalopram oxalate; Fluoxetine; Ibandronic acid; Sulfa antibiotics; Norvasc [amlodipine besylate]; Prozac [fluoxetine hcl]; and Trazodone hcl   Review of Systems Review of Systems  Unable to perform ROS: Dementia     Physical Exam Updated Vital Signs BP (!) 155/84 (BP Location: Right Arm)   Pulse (!) 55   Temp (!) 97.4 F (36.3 C) (Rectal)   Resp 17   SpO2 96%   Physical Exam Vitals signs and nursing note reviewed.  Constitutional:      General: She is not in acute distress.    Appearance: She is well-developed.  HENT:     Head: Normocephalic and atraumatic.     Mouth/Throat:     Mouth: Mucous membranes are dry.     Comments: Cracked lips, dry mouth Eyes:     Conjunctiva/sclera: Conjunctivae normal.     Pupils: Pupils are equal, round, and reactive to light.  Neck:     Musculoskeletal: Neck supple.  Cardiovascular:     Rate and Rhythm: Normal rate and regular rhythm.     Heart sounds: Murmur present.  Pulmonary:     Effort: Pulmonary effort is normal.     Breath sounds: Normal breath sounds.  Abdominal:     General: Bowel sounds are normal. There is no distension.      Palpations: Abdomen is soft.     Tenderness: There is no abdominal tenderness.  Musculoskeletal:     Right lower leg: Edema present.     Left lower leg: Edema present.     Comments: 1+ pitting edema BLE  Skin:    General: Skin is warm and dry.  Neurological:     Mental Status: She is alert.     Comments: Oriented to person, following basic commands      ED Treatments / Results  Labs (all labs ordered are listed, but only abnormal results are displayed) Labs Reviewed  COMPREHENSIVE METABOLIC PANEL - Abnormal; Notable for the following components:      Result Value   Glucose, Bld 104 (*)    BUN 36 (*)    Calcium 8.7 (*)    Total Protein 6.3 (*)    All other components within normal limits  CBC WITH DIFFERENTIAL/PLATELET - Abnormal; Notable for the following components:   RBC 3.33 (*)    Hemoglobin 10.2 (*)    HCT 33.1 (*)    All other components within normal limits  URINALYSIS, ROUTINE W REFLEX MICROSCOPIC - Abnormal; Notable for the following components:   Color, Urine AMBER (*)    APPearance HAZY (*)    Specific Gravity, Urine 1.031 (*)    Bilirubin Urine SMALL (*)    Ketones, ur 5 (*)    All other components within normal limits  CULTURE, BLOOD (ROUTINE X 2)  CULTURE, BLOOD (ROUTINE X 2)  URINE CULTURE  LACTIC ACID, PLASMA  CBG MONITORING, ED    EKG None  Radiology Dg Chest 2 View  Result Date: 11/30/2018 CLINICAL DATA:  Weakness and fever. Ex-smoker. EXAM: CHEST - 2 VIEW COMPARISON:  11/28/2018, 05/25/2018, 10/28/2017 and 02/16/2010. FINDINGS: A poor inspiration is demonstrated with no gross enlargement of the cardiac silhouette. The aorta remains tortuous. The patient remains rotated to the right. A slightly nodular  appearance of the right hilum and right superior mediastinum is again demonstrated. A similar appearance is demonstrated on 12/02/2017 when the patient is also rotated to the right its appearance is not seen when the patient is not rotated. Clear  lungs. Stable mild elevation of the left hemidiaphragm. Small posterior pleural effusion on the right. Diffuse osteopenia and mild scoliosis. IMPRESSION: 1. Small right pleural effusion. 2. Stable slightly nodular appearance of the right hilum and right superior mediastinum. This is most likely vascular and projectional due to the patient rotation. Electronically Signed   By: Claudie Revering M.D.   On: 11/30/2018 18:23    Procedures Procedures (including critical care time)  Medications Ordered in ED Medications  sodium chloride flush (NS) 0.9 % injection 3 mL (3 mLs Intravenous Given 11/30/18 1743)  lactated ringers bolus 1,000 mL (0 mLs Intravenous Stopped 11/30/18 2142)     Initial Impression / Assessment and Plan / ED Course  I have reviewed the triage vital signs and the nursing notes.  Pertinent labs & imaging results that were available during my care of the patient were reviewed by me and considered in my medical decision making (see chart for details).    Non-toxic on exam, reassuring vital signs.  Afebrile.  Lab work was obtained including cultures because of initial report of possible fever but patient has been afebrile for multiple hours here.  Her lab work has showed no evidence of infection, UA normal, normal creatinine, no evidence of dehydration, stable CBC.  Chest x-ray is negative acute.  I discussed her case with caregiver who was at the facility this evening and it sounds like she has had ongoing generalized failure to thrive since discharge from most recent hospitalization.  I have recommended that she follow closely with her PCP for further evaluation. PT discharged back to nursing facility.  Final Clinical Impressions(s) / ED Diagnoses   Final diagnoses:  None    ED Discharge Orders    None       Little, Wenda Overland, MD 11/30/18 2303

## 2018-11-30 NOTE — ED Notes (Signed)
Pt [facility] called to find out pt status. And asked for a call back regarding status once more information is obtained.

## 2018-11-30 NOTE — ED Notes (Signed)
[facility] called for transport, patient forms printed.

## 2018-12-01 DIAGNOSIS — Z7401 Bed confinement status: Secondary | ICD-10-CM | POA: Diagnosis not present

## 2018-12-01 DIAGNOSIS — M2550 Pain in unspecified joint: Secondary | ICD-10-CM | POA: Diagnosis not present

## 2018-12-01 NOTE — ED Notes (Signed)
[facility] called and notified of patient's return.

## 2018-12-02 LAB — URINE CULTURE
Culture: NO GROWTH
Special Requests: NORMAL

## 2018-12-06 LAB — CULTURE, BLOOD (ROUTINE X 2)
Culture: NO GROWTH
Special Requests: ADEQUATE

## 2018-12-09 DIAGNOSIS — L89212 Pressure ulcer of right hip, stage 2: Secondary | ICD-10-CM | POA: Diagnosis not present

## 2018-12-09 DIAGNOSIS — R55 Syncope and collapse: Secondary | ICD-10-CM | POA: Diagnosis not present

## 2018-12-09 DIAGNOSIS — I129 Hypertensive chronic kidney disease with stage 1 through stage 4 chronic kidney disease, or unspecified chronic kidney disease: Secondary | ICD-10-CM | POA: Diagnosis not present

## 2018-12-09 DIAGNOSIS — Z79899 Other long term (current) drug therapy: Secondary | ICD-10-CM | POA: Diagnosis not present

## 2018-12-09 DIAGNOSIS — N183 Chronic kidney disease, stage 3 (moderate): Secondary | ICD-10-CM | POA: Diagnosis not present

## 2018-12-18 DIAGNOSIS — H6123 Impacted cerumen, bilateral: Secondary | ICD-10-CM | POA: Diagnosis not present

## 2018-12-19 DIAGNOSIS — Z79899 Other long term (current) drug therapy: Secondary | ICD-10-CM | POA: Diagnosis not present

## 2018-12-20 DIAGNOSIS — L89152 Pressure ulcer of sacral region, stage 2: Secondary | ICD-10-CM | POA: Diagnosis not present

## 2018-12-20 DIAGNOSIS — N182 Chronic kidney disease, stage 2 (mild): Secondary | ICD-10-CM | POA: Diagnosis not present

## 2018-12-20 DIAGNOSIS — E538 Deficiency of other specified B group vitamins: Secondary | ICD-10-CM | POA: Diagnosis not present

## 2018-12-20 DIAGNOSIS — L905 Scar conditions and fibrosis of skin: Secondary | ICD-10-CM | POA: Diagnosis not present

## 2018-12-20 DIAGNOSIS — R69 Illness, unspecified: Secondary | ICD-10-CM | POA: Diagnosis not present

## 2018-12-20 DIAGNOSIS — E039 Hypothyroidism, unspecified: Secondary | ICD-10-CM | POA: Diagnosis not present

## 2018-12-20 DIAGNOSIS — E785 Hyperlipidemia, unspecified: Secondary | ICD-10-CM | POA: Diagnosis not present

## 2018-12-20 DIAGNOSIS — I129 Hypertensive chronic kidney disease with stage 1 through stage 4 chronic kidney disease, or unspecified chronic kidney disease: Secondary | ICD-10-CM | POA: Diagnosis not present

## 2018-12-20 DIAGNOSIS — G629 Polyneuropathy, unspecified: Secondary | ICD-10-CM | POA: Diagnosis not present

## 2018-12-20 DIAGNOSIS — I714 Abdominal aortic aneurysm, without rupture: Secondary | ICD-10-CM | POA: Diagnosis not present

## 2018-12-20 DIAGNOSIS — K922 Gastrointestinal hemorrhage, unspecified: Secondary | ICD-10-CM | POA: Diagnosis not present

## 2018-12-20 DIAGNOSIS — K219 Gastro-esophageal reflux disease without esophagitis: Secondary | ICD-10-CM | POA: Diagnosis not present

## 2018-12-20 DIAGNOSIS — L89312 Pressure ulcer of right buttock, stage 2: Secondary | ICD-10-CM | POA: Diagnosis not present

## 2019-01-02 DIAGNOSIS — Z79899 Other long term (current) drug therapy: Secondary | ICD-10-CM | POA: Diagnosis not present

## 2019-01-06 DIAGNOSIS — Z79899 Other long term (current) drug therapy: Secondary | ICD-10-CM | POA: Diagnosis not present

## 2019-01-06 DIAGNOSIS — R55 Syncope and collapse: Secondary | ICD-10-CM | POA: Diagnosis not present

## 2019-01-06 DIAGNOSIS — R5381 Other malaise: Secondary | ICD-10-CM | POA: Diagnosis not present

## 2019-01-06 DIAGNOSIS — G8929 Other chronic pain: Secondary | ICD-10-CM | POA: Diagnosis not present

## 2019-01-06 DIAGNOSIS — K5901 Slow transit constipation: Secondary | ICD-10-CM | POA: Diagnosis not present

## 2019-01-07 DIAGNOSIS — L89212 Pressure ulcer of right hip, stage 2: Secondary | ICD-10-CM | POA: Diagnosis not present

## 2019-01-08 DIAGNOSIS — N182 Chronic kidney disease, stage 2 (mild): Secondary | ICD-10-CM | POA: Diagnosis not present

## 2019-01-08 DIAGNOSIS — Z79899 Other long term (current) drug therapy: Secondary | ICD-10-CM | POA: Diagnosis not present

## 2019-01-09 DIAGNOSIS — Z79899 Other long term (current) drug therapy: Secondary | ICD-10-CM | POA: Diagnosis not present

## 2019-01-14 DIAGNOSIS — I739 Peripheral vascular disease, unspecified: Secondary | ICD-10-CM | POA: Diagnosis not present

## 2019-01-14 DIAGNOSIS — B351 Tinea unguium: Secondary | ICD-10-CM | POA: Diagnosis not present

## 2019-01-14 DIAGNOSIS — R600 Localized edema: Secondary | ICD-10-CM | POA: Diagnosis not present

## 2019-01-19 ENCOUNTER — Observation Stay (HOSPITAL_COMMUNITY)
Admission: EM | Admit: 2019-01-19 | Discharge: 2019-01-22 | Disposition: A | Discharge status: Nursing Home (Includes ICF) | Payer: Medicare HMO | Attending: Internal Medicine | Admitting: Internal Medicine

## 2019-01-19 ENCOUNTER — Other Ambulatory Visit: Payer: Self-pay

## 2019-01-19 DIAGNOSIS — F419 Anxiety disorder, unspecified: Secondary | ICD-10-CM | POA: Diagnosis not present

## 2019-01-19 DIAGNOSIS — Z8249 Family history of ischemic heart disease and other diseases of the circulatory system: Secondary | ICD-10-CM | POA: Insufficient documentation

## 2019-01-19 DIAGNOSIS — I714 Abdominal aortic aneurysm, without rupture: Secondary | ICD-10-CM | POA: Diagnosis not present

## 2019-01-19 DIAGNOSIS — G8929 Other chronic pain: Secondary | ICD-10-CM | POA: Insufficient documentation

## 2019-01-19 DIAGNOSIS — M545 Low back pain: Secondary | ICD-10-CM | POA: Insufficient documentation

## 2019-01-19 DIAGNOSIS — K625 Hemorrhage of anus and rectum: Secondary | ICD-10-CM | POA: Diagnosis present

## 2019-01-19 DIAGNOSIS — D649 Anemia, unspecified: Secondary | ICD-10-CM | POA: Insufficient documentation

## 2019-01-19 DIAGNOSIS — F329 Major depressive disorder, single episode, unspecified: Secondary | ICD-10-CM | POA: Diagnosis not present

## 2019-01-19 DIAGNOSIS — E7800 Pure hypercholesterolemia, unspecified: Secondary | ICD-10-CM | POA: Diagnosis not present

## 2019-01-19 DIAGNOSIS — K219 Gastro-esophageal reflux disease without esophagitis: Secondary | ICD-10-CM | POA: Insufficient documentation

## 2019-01-19 DIAGNOSIS — E039 Hypothyroidism, unspecified: Secondary | ICD-10-CM | POA: Insufficient documentation

## 2019-01-19 DIAGNOSIS — F0390 Unspecified dementia without behavioral disturbance: Secondary | ICD-10-CM | POA: Diagnosis not present

## 2019-01-19 DIAGNOSIS — I10 Essential (primary) hypertension: Secondary | ICD-10-CM | POA: Diagnosis not present

## 2019-01-19 DIAGNOSIS — Z79899 Other long term (current) drug therapy: Secondary | ICD-10-CM | POA: Diagnosis not present

## 2019-01-19 DIAGNOSIS — R52 Pain, unspecified: Secondary | ICD-10-CM | POA: Diagnosis not present

## 2019-01-19 DIAGNOSIS — R69 Illness, unspecified: Secondary | ICD-10-CM | POA: Diagnosis not present

## 2019-01-19 DIAGNOSIS — Z87891 Personal history of nicotine dependence: Secondary | ICD-10-CM | POA: Diagnosis not present

## 2019-01-19 DIAGNOSIS — M1990 Unspecified osteoarthritis, unspecified site: Secondary | ICD-10-CM | POA: Insufficient documentation

## 2019-01-19 DIAGNOSIS — K5900 Constipation, unspecified: Secondary | ICD-10-CM | POA: Insufficient documentation

## 2019-01-19 DIAGNOSIS — Z66 Do not resuscitate: Secondary | ICD-10-CM | POA: Diagnosis not present

## 2019-01-19 DIAGNOSIS — E538 Deficiency of other specified B group vitamins: Secondary | ICD-10-CM | POA: Insufficient documentation

## 2019-01-19 DIAGNOSIS — K922 Gastrointestinal hemorrhage, unspecified: Secondary | ICD-10-CM | POA: Diagnosis not present

## 2019-01-19 DIAGNOSIS — R58 Hemorrhage, not elsewhere classified: Secondary | ICD-10-CM | POA: Diagnosis not present

## 2019-01-19 LAB — CBC WITH DIFFERENTIAL/PLATELET
Abs Immature Granulocytes: 0.02 10*3/uL (ref 0.00–0.07)
Basophils Absolute: 0 10*3/uL (ref 0.0–0.1)
Basophils Relative: 1 %
EOS PCT: 3 %
Eosinophils Absolute: 0.1 10*3/uL (ref 0.0–0.5)
HCT: 31.4 % — ABNORMAL LOW (ref 36.0–46.0)
Hemoglobin: 9.2 g/dL — ABNORMAL LOW (ref 12.0–15.0)
Immature Granulocytes: 0 %
Lymphocytes Relative: 15 %
Lymphs Abs: 0.8 10*3/uL (ref 0.7–4.0)
MCH: 30.4 pg (ref 26.0–34.0)
MCHC: 29.3 g/dL — AB (ref 30.0–36.0)
MCV: 103.6 fL — AB (ref 80.0–100.0)
Monocytes Absolute: 0.7 10*3/uL (ref 0.1–1.0)
Monocytes Relative: 14 %
Neutro Abs: 3.5 10*3/uL (ref 1.7–7.7)
Neutrophils Relative %: 67 %
Platelets: 209 10*3/uL (ref 150–400)
RBC: 3.03 MIL/uL — ABNORMAL LOW (ref 3.87–5.11)
RDW: 16.5 % — ABNORMAL HIGH (ref 11.5–15.5)
WBC: 5.1 10*3/uL (ref 4.0–10.5)
nRBC: 0 % (ref 0.0–0.2)

## 2019-01-19 LAB — COMPREHENSIVE METABOLIC PANEL
ALT: 10 U/L (ref 0–44)
AST: 15 U/L (ref 15–41)
Albumin: 3 g/dL — ABNORMAL LOW (ref 3.5–5.0)
Alkaline Phosphatase: 57 U/L (ref 38–126)
Anion gap: 4 — ABNORMAL LOW (ref 5–15)
BUN: 31 mg/dL — ABNORMAL HIGH (ref 8–23)
CALCIUM: 8.1 mg/dL — AB (ref 8.9–10.3)
CO2: 27 mmol/L (ref 22–32)
CREATININE: 0.81 mg/dL (ref 0.44–1.00)
Chloride: 113 mmol/L — ABNORMAL HIGH (ref 98–111)
GFR calc Af Amer: >60 mL/min (ref >60)
GFR calc non Af Amer: >60 mL/min (ref >60)
Glucose, Bld: 102 mg/dL — ABNORMAL HIGH (ref 70–99)
Potassium: 3.8 mmol/L (ref 3.5–5.1)
SODIUM: 144 mmol/L (ref 135–145)
Total Bilirubin: 0.7 mg/dL (ref 0.3–1.2)
Total Protein: 5.7 g/dL — ABNORMAL LOW (ref 6.5–8.1)

## 2019-01-19 LAB — TYPE AND SCREEN
ABO/RH(D): O POS
Antibody Screen: NEGATIVE

## 2019-01-19 LAB — LIPASE, BLOOD: Lipase: 24 U/L (ref 11–51)

## 2019-01-19 MED ORDER — BIOTENE DRY MOUTH MT LIQD
30.0000 mL | Freq: Two times a day (BID) | OROMUCOSAL | Status: DC
  Administered 2019-01-20 – 2019-01-21 (×4): 30 mL via OROMUCOSAL

## 2019-01-19 MED ORDER — LOSARTAN POTASSIUM 50 MG PO TABS
100.0000 mg | ORAL_TABLET | Freq: Every day | ORAL | Status: DC
  Administered 2019-01-20 – 2019-01-21 (×3): 100 mg via ORAL
  Filled 2019-01-19 (×3): qty 2

## 2019-01-19 MED ORDER — OLANZAPINE 5 MG PO TABS
7.5000 mg | ORAL_TABLET | Freq: Every day | ORAL | Status: DC
  Administered 2019-01-20 – 2019-01-21 (×3): 7.5 mg via ORAL
  Filled 2019-01-19 (×3): qty 1

## 2019-01-19 MED ORDER — NETARSUDIL DIMESYLATE 0.02 % OP SOLN: 1.0000 [drp] | Freq: Every day | OPHTHALMIC | Status: DC

## 2019-01-19 MED ORDER — ACETAMINOPHEN 500 MG PO TABS: 500.0000 mg | ORAL_TABLET | Freq: Four times a day (QID) | ORAL | Status: DC | PRN

## 2019-01-19 MED ORDER — MIRTAZAPINE 15 MG PO TABS
7.5000 mg | ORAL_TABLET | Freq: Every day | ORAL | Status: DC
  Administered 2019-01-20 – 2019-01-21 (×3): 7.5 mg via ORAL
  Filled 2019-01-19 (×3): qty 1

## 2019-01-19 MED ORDER — PANTOPRAZOLE SODIUM 40 MG IV SOLR
40.0000 mg | Freq: Two times a day (BID) | INTRAVENOUS | Status: DC
  Administered 2019-01-20 – 2019-01-21 (×4): 40 mg via INTRAVENOUS
  Filled 2019-01-19 (×4): qty 40

## 2019-01-19 MED ORDER — SERTRALINE HCL 50 MG PO TABS
50.0000 mg | ORAL_TABLET | Freq: Every day | ORAL | Status: DC
  Administered 2019-01-20 – 2019-01-22 (×3): 50 mg via ORAL
  Filled 2019-01-19 (×3): qty 1

## 2019-01-19 MED ORDER — LACTATED RINGERS IV SOLN
INTRAVENOUS | Status: DC
  Administered 2019-01-20: via INTRAVENOUS

## 2019-01-19 MED ORDER — LEVOTHYROXINE SODIUM 50 MCG PO TABS
50.0000 ug | ORAL_TABLET | Freq: Every day | ORAL | Status: DC
  Administered 2019-01-20 – 2019-01-22 (×2): 50 ug via ORAL
  Filled 2019-01-19 (×2): qty 1

## 2019-01-19 MED ORDER — NITROGLYCERIN 0.4 MG SL SUBL: 0.4000 mg | SUBLINGUAL_TABLET | SUBLINGUAL | Status: DC | PRN

## 2019-01-19 MED ORDER — VITAMIN B-12 1000 MCG PO TABS
1000.0000 ug | ORAL_TABLET | Freq: Every day | ORAL | Status: DC
  Administered 2019-01-20 – 2019-01-22 (×3): 1000 ug via ORAL
  Filled 2019-01-19 (×3): qty 1

## 2019-01-19 MED ORDER — SODIUM CHLORIDE 0.9% FLUSH
3.0000 mL | Freq: Two times a day (BID) | INTRAVENOUS | Status: DC
  Administered 2019-01-20 – 2019-01-22 (×5): 3 mL via INTRAVENOUS

## 2019-01-19 MED ORDER — DORZOLAMIDE HCL-TIMOLOL MAL 2-0.5 % OP SOLN
1.0000 [drp] | Freq: Two times a day (BID) | OPHTHALMIC | Status: DC
  Administered 2019-01-20 – 2019-01-22 (×6): 1 [drp] via OPHTHALMIC
  Filled 2019-01-19: qty 10

## 2019-01-19 NOTE — ED Notes (Signed)
ED TO INPATIENT HANDOFF REPORT  ED Nurse Name and Phone #: Joseph Art [phone]  S Name/Age/Gender Margaret Li 85 y.o. female Room/Bed: WA20/WA20  Code Status   Code Status: Prior  Home/SNF/Other Nursing Home Patient oriented to: self, place, time and situation Is this baseline? Yes   Triage Complete: Triage complete  Chief Complaint Gi Bleed  Triage Note Pt came to the ED from Assisted Living due to rectal bleeding.  Reports of bright red blood with clots and some coffee ground.  Hx of diverticulitis. No N/V/D   Allergies Allergies  Allergen Reactions  . Trazodone And Nefazodone Anaphylaxis  . Clarithromycin     GI side effects Other reaction(s): Other GI side effects  . Codeine Other (See Comments)    GI side effects Other reaction(s): Other GI side effects  . Duloxetine Hcl     GI side effects Other reaction(s): Other GI side effects  . Escitalopram Oxalate Nausea Only and Other (See Comments)    dizziness Other reaction(s): Dizziness  . Fluoxetine     Other reaction(s): Other Abnormal weight loss; GI side effects  . Ibandronic Acid     Groin pain Other reaction(s): Other Groin pain  . Sulfa Antibiotics Other (See Comments)    Unknown reaction  Other reaction(s): Unknown  . Norvasc [Amlodipine Besylate] Other (See Comments)    Abdominal pain, bad dreams  . Prozac [Fluoxetine Hcl] Other (See Comments)    Abnormal weight loss, GI upset   . Trazodone Hcl Swelling and Rash    Swollen tongue     Level of Care/Admitting Diagnosis ED Disposition    ED Disposition Condition Comment   Admit  Hospital Area: [facility] [[id number]]  Level of Care: Telemetry [5]  Admit to tele based on following criteria: Other see comments  Comments: GI B  Diagnosis: GIB (gastrointestinal bleeding) [782423]  Admitting Physician: Vilma Prader [[id number]]  Attending Physician: Vilma Prader [[id number]]  PT Class (Do Not Modify): Observation [104]  PT Acc  Code (Do Not Modify): Observation [10022]       B Medical/Surgery History Past Medical History:  Diagnosis Date  . AAA (abdominal aortic aneurysm) ([place])    infrarenal, 3.2 cm. seen on CTA Dec 2018  . Anxiety   . Aortic calcification (HCC)   . Black tarry stools    "per patient"  . Cholelithiasis   . Chronic low back pain   . Depression   . Diverticular disease 1999   h/o diverticular block   . GERD (gastroesophageal reflux disease)   . Glaucoma   . H/O hyperthyroidism   . Hypercholesterolemia   . Hypertension   . Hypothyroidism   . Migraine   . Osteoarthritis   . Osteoporosis   . Tubular adenoma    h/o  . Vitamin B 12 deficiency    Past Surgical History:  Procedure Laterality Date  . ABDOMINAL HYSTERECTOMY     80's  . BREAST ENHANCEMENT SURGERY    . CATARACT EXTRACTION, BILATERAL    . ESOPHAGOGASTRODUODENOSCOPY (EGD) WITH PROPOFOL N/A 08/04/2016   Procedure: ESOPHAGOGASTRODUODENOSCOPY (EGD) WITH PROPOFOL;  Surgeon: Carol Ada, MD;  Location: [facility];  Service: Endoscopy;  Laterality: N/A;  . HERNIA REPAIR Right    Inguinal hernia repair '12 -Dr. Rise Patience  . JOINT REPLACEMENT Right    -RTHA -4 '11 - Dr. [name]- [facility]     A IV Location/Drains/Wounds Patient Lines/Drains/Airways Status   Active Line/Drains/Airways    Name:   Placement date:  Placement time:   Site:   Days:   Peripheral IV 01/19/19 Left Forearm   01/19/19    1955    Forearm   less than 1   Pressure Injury 11/09/18 Stage I -  Intact skin with non-blanchable redness of a localized area usually over a bony prominence. Pink nonblanchable    11/09/18    2210     71          Intake/Output Last 24 hours No intake or output data in the 24 hours ending 01/19/19 2215  Labs/Imaging Results for orders placed or performed during the hospital encounter of 01/19/19 (from the past 48 hour(s))  Comprehensive metabolic panel     Status: Abnormal   Collection Time: 01/19/19  7:33 PM  Result Value  Ref Range   Sodium 144 135 - 145 mmol/L   Potassium 3.8 3.5 - 5.1 mmol/L   Chloride 113 (H) 98 - 111 mmol/L   CO2 27 22 - 32 mmol/L   Glucose, Bld 102 (H) 70 - 99 mg/dL   BUN 31 (H) 8 - 23 mg/dL   Creatinine, Ser 0.81 0.44 - 1.00 mg/dL   Calcium 8.1 (L) 8.9 - 10.3 mg/dL   Total Protein 5.7 (L) 6.5 - 8.1 g/dL   Albumin 3.0 (L) 3.5 - 5.0 g/dL   AST 15 15 - 41 U/L   ALT 10 0 - 44 U/L   Alkaline Phosphatase 57 38 - 126 U/L   Total Bilirubin 0.7 0.3 - 1.2 mg/dL   GFR calc non Af Amer >60 >60 mL/min   GFR calc Af Amer >60 >60 mL/min   Anion gap 4 (L) 5 - 15    Comment: Performed at [facility], [place] [address], [place], [place] [address]  CBC with Differential     Status: Abnormal   Collection Time: 01/19/19  7:33 PM  Result Value Ref Range   WBC 5.1 4.0 - 10.5 K/uL   RBC 3.03 (L) 3.87 - 5.11 MIL/uL   Hemoglobin 9.2 (L) 12.0 - 15.0 g/dL   HCT 31.4 (L) 36.0 - 46.0 %   MCV 103.6 (H) 80.0 - 100.0 fL   MCH 30.4 26.0 - 34.0 pg   MCHC 29.3 (L) 30.0 - 36.0 g/dL   RDW 16.5 (H) 11.5 - 15.5 %   Platelets 209 150 - 400 K/uL   nRBC 0.0 0.0 - 0.2 %   Neutrophils Relative % 67 %   Neutro Abs 3.5 1.7 - 7.7 K/uL   Lymphocytes Relative 15 %   Lymphs Abs 0.8 0.7 - 4.0 K/uL   Monocytes Relative 14 %   Monocytes Absolute 0.7 0.1 - 1.0 K/uL   Eosinophils Relative 3 %   Eosinophils Absolute 0.1 0.0 - 0.5 K/uL   Basophils Relative 1 %   Basophils Absolute 0.0 0.0 - 0.1 K/uL   Immature Granulocytes 0 %   Abs Immature Granulocytes 0.02 0.00 - 0.07 K/uL    Comment: Performed at [facility], [place] [address], [place], [place] [address]  Lipase, blood     Status: None   Collection Time: 01/19/19  7:33 PM  Result Value Ref Range   Lipase 24 11 - 51 U/L    Comment: Performed at [facility], [place] [address], [place], [place] [address]  Type and screen [facility]     Status: None   Collection Time: 01/19/19  7:33 PM  Result  Value Ref Range  ABO/RH(D) O POS    Antibody Screen NEG    Sample Expiration      01/22/2019 Performed at [facility], [place] [address], [place], [place] [address]    No results found.  Pending Labs [employer] (From admission, onward)    Start     Ordered   Signed and Held  CBC  Tomorrow morning,   R     Signed and Held   Signed and Occupational hygienist morning,   R     Signed and Held   Signed and Held  APTT  Once,   R     Signed and Held   Signed and Held  Protime-INR  Once,   R     Signed and Held   Signed and Held  Iron and TIBC  Once,   R     Signed and Held   Visual merchandiser and Held  Ferritin  Tomorrow morning,   R     Signed and Held   Visual merchandiser and Held  Type and screen [place] - STAT,   R    Comments:  [place]    Signed and Held          Vitals/Pain Today's Vitals   01/19/19 2100 01/19/19 2120 01/19/19 2140 01/19/19 2200  BP: 118/80 119/86 125/84 123/77  Pulse: 73 74 78 76  Resp: 15 16 15 16   Temp:      TempSrc:      SpO2: 99% 99% 100% 99%  Weight:      Height:      PainSc:  Asleep      Isolation Precautions No active isolations  Medications Medications - No data to display  Mobility walks with device Low fall risk   Focused Assessments Neuro Assessment Handoff:  Swallow screen pass? Yes  Cardiac Rhythm: Normal sinus rhythm       Neuro Assessment:   Neuro Checks:      Last Documented NIHSS Modified Score:   Has TPA been given? No If patient is a Neuro Trauma and patient is going to OR before floor call report to [place] nurse: [phone] or [phone]     R Recommendations: See Admitting Provider Note  Report given to:   Additional Notes: GI bleed

## 2019-01-19 NOTE — ED Provider Notes (Signed)
[facility] DEPT Provider Note   CSN: [id number] Arrival date & time: 01/19/19  1906    History   Chief Complaint Chief Complaint  Patient presents with  . Rectal Bleeding    HPI LYNETTA TOMCZAK is a 85 y.o. female.     Patient is a 85 year old female who presents with rectal bleeding.  She lives in assisted living facility.  She states that she had one episode today of bright red blood in her depends.  She has had a history of bleeding one time in the past a couple years ago which she says is related to diverticulosis.  She denies any associate abdominal pain.  No nausea or vomiting.  No dizziness.  No chest pain or shortness of breath.  She is not on anticoagulants.     Past Medical History:  Diagnosis Date  . AAA (abdominal aortic aneurysm) ([place])    infrarenal, 3.2 cm. seen on CTA Dec 2018  . Anxiety   . Aortic calcification (HCC)   . Black tarry stools    "per patient"  . Cholelithiasis   . Chronic low back pain   . Depression   . Diverticular disease 1999   h/o diverticular block   . GERD (gastroesophageal reflux disease)   . Glaucoma   . H/O hyperthyroidism   . Hypercholesterolemia   . Hypertension   . Hypothyroidism   . Migraine   . Osteoarthritis   . Osteoporosis   . Tubular adenoma    h/o  . Vitamin B 12 deficiency     Patient Active Problem List   Diagnosis Date Noted  . Hypokalemia   . Hypomagnesemia   . Dysphagia   . Pressure injury of skin 11/10/2018  . Syncope 11/09/2018  . AAA (abdominal aortic aneurysm) ([place]) 11/09/2018  . CKD (chronic kidney disease), stage II 11/09/2018  . History of CVA (cerebrovascular accident) 11/09/2018  . Orthostasis 11/09/2018  . Dementia ([place]) 11/09/2018  . Major depressive disorder with psychotic features ([place]) 05/11/2018  . Vision loss, left eye 02/17/2017  . Visual hallucinations 02/17/2017  . Neuropathy 02/17/2017  . Hypertension 06/14/2015  . Hypothyroidism 06/14/2015  .  Glaucoma 06/14/2015  . Rectal bleeding 06/13/2015    Past Surgical History:  Procedure Laterality Date  . ABDOMINAL HYSTERECTOMY     80's  . BREAST ENHANCEMENT SURGERY    . CATARACT EXTRACTION, BILATERAL    . ESOPHAGOGASTRODUODENOSCOPY (EGD) WITH PROPOFOL N/A 08/04/2016   Procedure: ESOPHAGOGASTRODUODENOSCOPY (EGD) WITH PROPOFOL;  Surgeon: Carol Ada, MD;  Location: [facility];  Service: Endoscopy;  Laterality: N/A;  . HERNIA REPAIR Right    Inguinal hernia repair '12 -Dr. Rise Patience  . JOINT REPLACEMENT Right    -RTHA -4 '11 - Dr. [name]- [facility]     OB History   No obstetric history on file.      Home Medications    Prior to Admission medications   Medication Sig Start Date End Date Taking? Authorizing Provider  acetaminophen (TYLENOL) 500 MG tablet Take 500 mg by mouth every 6 (six) hours as needed for mild pain or headache.    [provider]  aspirin EC 81 MG EC tablet Take 1 tablet (81 mg total) by mouth daily. 11/16/18   Florencia Reasons, MD  bisacodyl (DULCOLAX) 10 MG suppository Place 1 suppository (10 mg total) rectally every Sunday. 11/17/18   Florencia Reasons, MD  busPIRone (BUSPAR) 5 MG tablet Take 5 mg by mouth 2 (two) times daily.    [provider]  calcium elemental as carbonate (ANTACID MAXIMUM) 400 MG chewable tablet Chew 1,000 mg by mouth at bedtime.    [provider]  dorzolamide-timolol (COSOPT) 22.3-6.8 MG/ML ophthalmic solution Place 1 drop into both eyes 2 (two) times daily.    [provider]  ENSURE (ENSURE) Take 237 mLs by mouth 2 (two) times daily. Chocolate    [provider]  fluticasone (FLONASE) 50 MCG/ACT nasal spray Place 1 spray into both nostrils daily.    [provider]  hydrALAZINE (APRESOLINE) 10 MG tablet Take 1 tablet (10 mg total) by mouth 3 (three) times daily for 30 days. 11/15/18 12/15/18  Florencia Reasons, MD  labetalol (NORMODYNE) 100 MG tablet Take 100 mg by mouth 2 (two) times daily. Hold for SBP <100 or  pulse <55    [provider]  levothyroxine (SYNTHROID, LEVOTHROID) 50 MCG tablet Take 50 mcg by mouth daily before breakfast.     [provider]  losartan (COZAAR) 100 MG tablet Take 100 mg by mouth at bedtime.  06/01/15   [provider]  magnesium oxide (MAG-OX) 400 MG tablet Take 1 tablet (400 mg total) by mouth daily. 11/15/18   Florencia Reasons, MD  memantine (NAMENDA) 10 MG tablet Take 10 mg by mouth 2 (two) times daily.    [provider]  Multiple Vitamins-Minerals (MULTIVITAMIN WOMEN 50+) TABS Take 1 tablet by mouth daily.    [provider]  Netarsudil Dimesylate 0.02 % SOLN Place 1 drop into both eyes at bedtime. "Rhopressa" 09/07/17   [provider]  nitroGLYCERIN (NITROSTAT) 0.4 MG SL tablet Place 1 tablet (0.4 mg total) under the tongue every 5 (five) minutes as needed for chest pain (q39mins up to three times). 11/15/18   Florencia Reasons, MD  OLANZapine (ZYPREXA) 7.5 MG tablet Take 7.5 mg by mouth at bedtime.    [provider]  pantoprazole (PROTONIX) 40 MG tablet Take 1 tablet (40 mg total) by mouth daily. 11/16/18   Florencia Reasons, MD  polyethylene glycol [facility] / Floria Raveling) packet Take 17 g by mouth 2 (two) times daily. 11/15/18   Florencia Reasons, MD  sennosides-docusate sodium (SENOKOT-S) 8.6-50 MG tablet Take 1 tablet by mouth daily.    [provider]  sertraline (ZOLOFT) 50 MG tablet Take 50 mg by mouth daily.    [provider]  simvastatin (ZOCOR) 10 MG tablet Take 0.5 tablets (5 mg total) by mouth daily at 6 PM. Patient not taking: Reported on 11/28/2018 11/15/18   Florencia Reasons, MD  simvastatin (ZOCOR) 5 MG tablet Take 5 mg by mouth daily at 6 PM.    [provider]  vitamin B-12 (CYANOCOBALAMIN) 1000 MCG tablet Take 1,000 mcg by mouth daily.    [provider]    Family History Family History  Problem Relation Age of Onset  . Breast cancer Sister        half sister  . Heart disease Brother        half  brother    Social History Social History   Tobacco Use  . Smoking status: Former Smoker    Types: Cigarettes    Last attempt to quit: 08/01/2011    Years since quitting: 7.4  . Smokeless tobacco: Never Used  Substance Use Topics  . Alcohol use: No  . Drug use: No     Allergies   Trazodone and nefazodone; Clarithromycin; Codeine; Duloxetine hcl; Escitalopram oxalate; Fluoxetine; Ibandronic acid; Sulfa antibiotics; Norvasc [amlodipine besylate]; Prozac [fluoxetine hcl];  and Trazodone hcl   Review of Systems Review of Systems  Constitutional: Negative for chills, diaphoresis, fatigue and fever.  HENT: Negative for congestion, rhinorrhea and sneezing.   Eyes: Negative.   Respiratory: Negative for cough, chest tightness and shortness of breath.   Cardiovascular: Negative for chest pain and leg swelling.  Gastrointestinal: Positive for blood in stool. Negative for abdominal pain, diarrhea, nausea and vomiting.  Genitourinary: Negative for difficulty urinating, flank pain, frequency and hematuria.  Musculoskeletal: Negative for arthralgias and back pain.  Skin: Negative for rash.  Neurological: Negative for dizziness, speech difficulty, weakness, numbness and headaches.     Physical Exam Updated Vital Signs BP 118/80   Pulse 73   Temp 98.5 F (36.9 C) (Oral)   Resp 15   Ht 5' 7.5" (1.715 m)   Wt 59 kg   SpO2 99%   BMI 20.06 kg/m   Physical Exam Constitutional:      Appearance: She is well-developed.  HENT:     Head: Normocephalic and atraumatic.  Eyes:     Pupils: Pupils are equal, round, and reactive to light.  Neck:     Musculoskeletal: Normal range of motion and neck supple.  Cardiovascular:     Rate and Rhythm: Normal rate and regular rhythm.     Heart sounds: Normal heart sounds.  Pulmonary:     Effort: Pulmonary effort is normal. No respiratory distress.     Breath sounds: Normal breath sounds. No wheezing or rales.  Chest:     Chest wall: No  tenderness.  Abdominal:     General: Bowel sounds are normal.     Palpations: Abdomen is soft.     Tenderness: There is no abdominal tenderness. There is no guarding or rebound.  Genitourinary:    Comments: Positive bright red blood on rectal exam Musculoskeletal: Normal range of motion.  Lymphadenopathy:     Cervical: No cervical adenopathy.  Skin:    General: Skin is warm and dry.     Findings: No rash.  Neurological:     Mental Status: She is alert and oriented to person, place, and time.      ED Treatments / Results  Labs (all labs ordered are listed, but only abnormal results are displayed) Labs Reviewed  COMPREHENSIVE METABOLIC PANEL - Abnormal; Notable for the following components:      Result Value   Chloride 113 (*)    Glucose, Bld 102 (*)    BUN 31 (*)    Calcium 8.1 (*)    Total Protein 5.7 (*)    Albumin 3.0 (*)    Anion gap 4 (*)    All other components within normal limits  CBC WITH DIFFERENTIAL/PLATELET - Abnormal; Notable for the following components:   RBC 3.03 (*)    Hemoglobin 9.2 (*)    HCT 31.4 (*)    MCV 103.6 (*)    MCHC 29.3 (*)    RDW 16.5 (*)    All other components within normal limits  LIPASE, BLOOD  TYPE AND SCREEN    EKG None  Radiology No results found.  Procedures Procedures (including critical care time)  Medications Ordered in ED Medications - No data to display   Initial Impression / Assessment and Plan / ED Course  I have reviewed the triage vital signs and the nursing notes.  Pertinent labs & imaging results that were available during my care of the patient were reviewed by me and considered in my medical decision making (see  chart for details).        Patient is 85 year old female who presents with bright red blood per rectum.  She has no associate abdominal pain.  Her vital signs are stable.  Her hemoglobin has dropped a little bit from her last values.  It is 9.2 today.  She did have a type and screen  performed.  I spoke with Dr. Stana Bunting with the hospitalist service who will admit the patient for further treatment.  Final Clinical Impressions(s) / ED Diagnoses   Final diagnoses:  Rectal bleeding    ED Discharge Orders    None       Malvin Johns, MD 01/19/19 2114

## 2019-01-19 NOTE — ED Triage Notes (Signed)
Pt came to the ED from Assisted Living due to rectal bleeding.  Reports of bright red blood with clots and some coffee ground.  Hx of diverticulitis. No N/V/D

## 2019-01-19 NOTE — ED Notes (Signed)
POA Diane Earnie Larsson called at this time and updated on plan of car.e

## 2019-01-19 NOTE — H&P (Signed)
History and Physical   JASHAY RODDY EXB:[id number] DOB: [birth year] DOA: 01/19/2019  PCP: Darcus Austin, MD (Inactive)  History is obtained via chart review, discussion with the emergency medicine team, and patient provided history.  Chief Complaint: Bleeding per rectum  HPI: This is an 85 year old woman with mild dementia living in an assisted living facility requiring help with some of her ADLs, anxiety/depression, hypertension, hypothyroidism, who presents with bleeding per rectum.  The patient underwent upper endoscopy in September 2017 which revealed duodenal lipoma, erythematous mucosa in the gastric antrum, nonbleeding diverticula in the upper GI tract.  In 2016 she was admitted with rectal bleeding hemoglobin nadir between 7 and 8, was given packed red blood cell transfusion at that time.  The patient reports at this time she had an episode of bright red blood per rectum, warranting medical evaluation.  She reports having some right sided hemi-abdomen pain, transient in nature, mild in intensity, lasted for minutes.  She does report she struggles with constipation on a fairly regular basis.  Denies nausea, vomiting, hemoptysis, hematemesis, alcohol use.  Currently, she spends most of her time watching television, has a granddaughter who lives in [place].  She walks with a 4 wheeled walker.  ED Course: In the emergency department, initial heart rate of 72, peaked at 102, systolic blood pressure ranging from the 120s to 170, hemoglobin of 9.2, lipase within normal limits, BUN of 31, creatinine of 0.84.  The emergency medicine team perform rectal exam which revealed bright red blood on the rectal exam.  Hospital medicine was consulted for further management.  Review of Systems: A complete ROS was obtained; pertinent positives negatives are denoted in the HPI. Otherwise, all systems are negative.   Past Medical History:  Diagnosis Date  . AAA (abdominal aortic aneurysm) ([place])    infrarenal, 3.2 cm. seen on CTA Dec 2018  . Anxiety   . Aortic calcification (HCC)   . Black tarry stools    "per patient"  . Cholelithiasis   . Chronic low back pain   . Depression   . Diverticular disease 1999   h/o diverticular block   . GERD (gastroesophageal reflux disease)   . Glaucoma   . H/O hyperthyroidism   . Hypercholesterolemia   . Hypertension   . Hypothyroidism   . Migraine   . Osteoarthritis   . Osteoporosis   . Tubular adenoma    h/o  . Vitamin B 12 deficiency    Social History   Socioeconomic History  . Marital status: Divorced    Spouse name: Not on file  . Number of children: 2  . Years of education: Not on file  . Highest education level: Not on file  Occupational History  . Not on file  Social Needs  . Financial resource strain: Not on file  . Food insecurity:    Worry: Not on file    Inability: Not on file  . Transportation needs:    Medical: Not on file    Non-medical: Not on file  Tobacco Use  . Smoking status: Former Smoker    Types: Cigarettes    Last attempt to quit: 08/01/2011    Years since quitting: 7.4  . Smokeless tobacco: Never Used  Substance and Sexual Activity  . Alcohol use: No  . Drug use: No  . Sexual activity: Not Currently  Lifestyle  . Physical activity:    Days per week: Not on file    Minutes per session: Not on file  .  Stress: Not on file  Relationships  . Social connections:    Talks on phone: Not on file    Gets together: Not on file    Attends religious service: Not on file    Active member of club or organization: Not on file    Attends meetings of clubs or organizations: Not on file    Relationship status: Not on file  . Intimate partner violence:    Fear of current or ex partner: Not on file    Emotionally abused: Not on file    Physically abused: Not on file    Forced sexual activity: Not on file  Other Topics Concern  . Not on file  Social History Narrative   Lives at home alone    She just  returned home from Assisted Living   Right handed   Family History  Problem Relation Age of Onset  . Breast cancer Sister        half sister  . Heart disease Brother        half brother    Physical Exam: Vitals:   01/19/19 2120 01/19/19 2140 01/19/19 2200 01/19/19 2232  BP: 119/86 125/84 123/77 (!) 144/93  Pulse: 74 78 76 71  Resp: 16 15 16 16   Temp:    97.9 F (36.6 C)  TempSrc:    Oral  SpO2: 99% 100% 99% 100%  Weight:      Height:       General: Appears calm and comfortable.  Pleasant elderly black woman. ENT: Grossly normal hearing, MMM. Cardiovascular: RRR. No M/R/G.  1+ bilateral lower extremity edema. Respiratory: CTA bilaterally. No wheezes or crackles. Normal respiratory effort.  Breath sounds distant, breathing room air. Abdomen: Soft, non-tender.  No rebound or guarding.  Rectal exam deferred due to recently performed by the emergency medicine team. Skin: No rash or induration seen on limited exam.  Age-related skin thinning. Musculoskeletal: Grossly normal tone BUE/BLE. Appropriate ROM.  Sits up without assistance. Psychiatric: Grossly normal mood and affect.   Neurologic: Moves all extremities in coordinated fashion.  Oriented to year-2020, oriented to hospital-[place].  I have personally reviewed the following labs, culture data, and imaging studies.  Assessment/Plan:  #Acute GI bleed, likely lower #Acute on chronic anemia, normocytic Course: Patient with history of rectal bleeding in 06/2015 requiring blood transfusion; prior EGD in 2017 with diverticula, erythematous mucosa in gastric region, duodenal lipoma.   This admission, had bright red blood per rectum, mild right sided hemi-abdomen pain in the background of intermittent constipation.  On admission hb of 9.2. Prior CT scan in 2018 revealed diffuse diverticulosis throughout entire colon. A/P:  Clinical picture consistent with lower GI tract in etiology, favor diverticular. Tachycardia was transient  and resolved prior to any interventions. At this time appears hemodynamically stable. Will provide IV PPI BID initially given prior abnormal EGD findings.  LR at 75 cc x 10 hrs then re-assess, 2 peripheral IVs, type and screen, NPO in event endoscopic eval becomes necessary. PT/PTT to evaluate for any underlying coagulopathy.  Iron panel to further evaluate anemia. Telemetry requested.  #Other problems: -Dementia: appears relatively mild, continue home olanzapine with mirtazapine; hold memantine  -Hx of B12 deficiency: continue PO supplementation -HTN: continue ARB, hold BB and vasodilators in setting of GIB and would like to see compensatory tachycardia if needed -Anxiety / depression: continue SSRI, hold buspar -Hx of AAA in the past: seen on prior CT in 10/2017, infra-renal AAA dilation to 3.2 cm, due for  repeat US in ~10/2020  DVT prophylaxis: SCD Code Status: DNR / DNI, but reportedly would like medical interventions to treat acute illenss Disposition Plan: Anticipate D/C back to ALF when medically stable Consults called:  none Admission status:  Admit to hospital medicine service  Cheri Rous, MD [facility] Page:[phone]  If 7PM-7AM, please contact night-coverage [email or web address] Password [email or web address]  This document was created using the aid of voice recognition / dication software.

## 2019-01-19 NOTE — ED Notes (Signed)
Bed: WA20 Expected date:  Expected time:  Means of arrival:  Comments: 85 yo rectal bleed, abd pain

## 2019-01-20 ENCOUNTER — Encounter (HOSPITAL_COMMUNITY): Payer: Self-pay

## 2019-01-20 DIAGNOSIS — D500 Iron deficiency anemia secondary to blood loss (chronic): Secondary | ICD-10-CM | POA: Diagnosis not present

## 2019-01-20 DIAGNOSIS — K625 Hemorrhage of anus and rectum: Secondary | ICD-10-CM

## 2019-01-20 DIAGNOSIS — K922 Gastrointestinal hemorrhage, unspecified: Secondary | ICD-10-CM | POA: Diagnosis not present

## 2019-01-20 DIAGNOSIS — K5731 Diverticulosis of large intestine without perforation or abscess with bleeding: Secondary | ICD-10-CM | POA: Diagnosis not present

## 2019-01-20 DIAGNOSIS — R195 Other fecal abnormalities: Secondary | ICD-10-CM | POA: Diagnosis not present

## 2019-01-20 DIAGNOSIS — K5730 Diverticulosis of large intestine without perforation or abscess without bleeding: Secondary | ICD-10-CM | POA: Diagnosis not present

## 2019-01-20 LAB — BASIC METABOLIC PANEL
ANION GAP: 5 (ref 5–15)
BUN: 29 mg/dL — ABNORMAL HIGH (ref 8–23)
CO2: 26 mmol/L (ref 22–32)
Calcium: 8 mg/dL — ABNORMAL LOW (ref 8.9–10.3)
Chloride: 111 mmol/L (ref 98–111)
Creatinine, Ser: 0.68 mg/dL (ref 0.44–1.00)
GFR calc Af Amer: >60 mL/min (ref >60)
GFR calc non Af Amer: >60 mL/min (ref >60)
Glucose, Bld: 84 mg/dL (ref 70–99)
POTASSIUM: 3.5 mmol/L (ref 3.5–5.1)
Sodium: 142 mmol/L (ref 135–145)

## 2019-01-20 LAB — CBC
HCT: 29.1 % — ABNORMAL LOW (ref 36.0–46.0)
HEMOGLOBIN: 8.5 g/dL — AB (ref 12.0–15.0)
MCH: 30.4 pg (ref 26.0–34.0)
MCHC: 29.2 g/dL — ABNORMAL LOW (ref 30.0–36.0)
MCV: 103.9 fL — ABNORMAL HIGH (ref 80.0–100.0)
Platelets: 200 10*3/uL (ref 150–400)
RBC: 2.8 MIL/uL — AB (ref 3.87–5.11)
RDW: 16.1 % — ABNORMAL HIGH (ref 11.5–15.5)
WBC: 4.3 10*3/uL (ref 4.0–10.5)
nRBC: 0 % (ref 0.0–0.2)

## 2019-01-20 LAB — IRON AND TIBC
IRON: 38 ug/dL (ref 28–170)
Saturation Ratios: 26 % (ref 10.4–31.8)
TIBC: 146 ug/dL — ABNORMAL LOW (ref 250–450)
UIBC: 108 ug/dL

## 2019-01-20 LAB — PROTIME-INR
INR: 1.2 (ref 0.8–1.2)
Prothrombin Time: 14.9 seconds (ref 11.4–15.2)

## 2019-01-20 LAB — FERRITIN: Ferritin: 82 ng/mL (ref 11–307)

## 2019-01-20 LAB — APTT: aPTT: 37 seconds — ABNORMAL HIGH (ref 24–36)

## 2019-01-20 LAB — MRSA PCR SCREENING: MRSA by PCR: NEGATIVE

## 2019-01-20 MED ORDER — FUROSEMIDE 10 MG/ML IJ SOLN
20.0000 mg | Freq: Once | INTRAMUSCULAR | Status: AC
  Administered 2019-01-20: 20 mg via INTRAVENOUS
  Filled 2019-01-20: qty 2

## 2019-01-20 MED ORDER — MEMANTINE HCL 10 MG PO TABS
5.0000 mg | ORAL_TABLET | Freq: Two times a day (BID) | ORAL | Status: DC
  Administered 2019-01-20 – 2019-01-22 (×4): 5 mg via ORAL
  Filled 2019-01-20 (×4): qty 1

## 2019-01-20 NOTE — Consult Note (Signed)
Reason for Consult: Hematochezia and anemia Referring Physician: [facility]  Alberteen Sam Eisner HPI: This is an 85 year old female who is well-known to me for a prior diverticular bleed admitted for painless hematochezia.  She has an episode at the nursing facility and she reports the last bleeding episode was 4-5 PM yesterday.  Because of the bleeding, she was sent to the ER.  In 03/2016 a colonoscopy was performed to work up the bleeding and she was noted to have a pandiverticulosis and a couple of adenomas.  In September that year she underwent an EGD for complaints of melena and a large paraesophageal hiatal hernia as well as a large pedunculated duodenal lipoma was found.  A mild erythema was noted, but it was not biopsied as a result of the mild findings and her age.  Currently she feels well.  Past Medical History:  Diagnosis Date  . AAA (abdominal aortic aneurysm) ([place])    infrarenal, 3.2 cm. seen on CTA Dec 2018  . Anxiety   . Aortic calcification (H[facility])   . Black tarry stools    "per patient"  . Cholelithiasis   . Chronic low back pain   . Depression   . Diverticular disease 1999   h/o diverticular block   . GERD (gastroesophageal reflux disease)   . Glaucoma   . H/O hyperthyroidism   . Hypercholesterolemia   . Hypertension   . Hypothyroidism   . Migraine   . Osteoarthritis   . Osteoporosis   . Tubular adenoma    h/o  . Vitamin B 12 deficiency     Past Surgical History:  Procedure Laterality Date  . ABDOMINAL HYSTERECTOMY     80's  . BREAST ENHANCEMENT SURGERY    . CATARACT EXTRACTION, BILATERAL    . ESOPHAGOGASTRODUODENOSCOPY (EGD) WITH PROPOFOL N/A 08/04/2016   Procedure: ESOPHAGOGASTRODUODENOSCOPY (EGD) WITH PROPOFOL;  Surgeon: Carol Ada, MD;  Location: [facility];  Service: Endoscopy;  Laterality: N/A;  . HERNIA REPAIR Right    Inguinal hernia repair '12 -Dr. Rise Patience  . JOINT REPLACEMENT Right    -RTHA -4 '11 - Dr. [name]- [facility]    Family History   Problem Relation Age of Onset  . Breast cancer Sister        half sister  . Heart disease Brother        half brother    Social History:  reports that she quit smoking about 7 years ago. Her smoking use included cigarettes. She has never used smokeless tobacco. She reports that she does not drink alcohol or use drugs.  Allergies:  Allergies  Allergen Reactions  . Trazodone And Nefazodone Anaphylaxis  . Clarithromycin     GI side effects Other reaction(s): Other GI side effects  . Codeine Other (See Comments)    GI side effects Other reaction(s): Other GI side effects  . Duloxetine Hcl     GI side effects Other reaction(s): Other GI side effects  . Escitalopram Oxalate Nausea Only and Other (See Comments)    dizziness Other reaction(s): Dizziness  . Fluoxetine     Other reaction(s): Other Abnormal weight loss; GI side effects  . Ibandronic Acid     Groin pain Other reaction(s): Other Groin pain  . Sulfa Antibiotics Other (See Comments)    Unknown reaction  Other reaction(s): Unknown  . Norvasc [Amlodipine Besylate] Other (See Comments)    Abdominal pain, bad dreams  . Prozac [Fluoxetine Hcl] Other (See Comments)    Abnormal weight loss, GI  upset   . Trazodone Hcl Swelling and Rash    Swollen tongue     Medications:  Scheduled: . antiseptic oral rinse  30 mL Mouth Rinse BID  . dorzolamide-timolol  1 drop Both Eyes BID  . levothyroxine  50 mcg Oral Q0600  . losartan  100 mg Oral QHS  . mirtazapine  7.5 mg Oral QHS  . Netarsudil Dimesylate  1 drop Both Eyes QHS  . OLANZapine  7.5 mg Oral QHS  . pantoprazole (PROTONIX) IV  40 mg Intravenous Q12H  . sertraline  50 mg Oral Daily  . sodium chloride flush  3 mL Intravenous Q12H  . vitamin B-12  1,000 mcg Oral Daily   Continuous:   Results for orders placed or performed during the hospital encounter of 01/19/19 (from the past 24 hour(s))  Comprehensive metabolic panel     Status: Abnormal   Collection Time:  01/19/19  7:33 PM  Result Value Ref Range   Sodium 144 135 - 145 mmol/L   Potassium 3.8 3.5 - 5.1 mmol/L   Chloride 113 (H) 98 - 111 mmol/L   CO2 27 22 - 32 mmol/L   Glucose, Bld 102 (H) 70 - 99 mg/dL   BUN 31 (H) 8 - 23 mg/dL   Creatinine, Ser 0.81 0.44 - 1.00 mg/dL   Calcium 8.1 (L) 8.9 - 10.3 mg/dL   Total Protein 5.7 (L) 6.5 - 8.1 g/dL   Albumin 3.0 (L) 3.5 - 5.0 g/dL   AST 15 15 - 41 U/L   ALT 10 0 - 44 U/L   Alkaline Phosphatase 57 38 - 126 U/L   Total Bilirubin 0.7 0.3 - 1.2 mg/dL   GFR calc non Af Amer >60 >60 mL/min   GFR calc Af Amer >60 >60 mL/min   Anion gap 4 (L) 5 - 15  CBC with Differential     Status: Abnormal   Collection Time: 01/19/19  7:33 PM  Result Value Ref Range   WBC 5.1 4.0 - 10.5 K/uL   RBC 3.03 (L) 3.87 - 5.11 MIL/uL   Hemoglobin 9.2 (L) 12.0 - 15.0 g/dL   HCT 31.4 (L) 36.0 - 46.0 %   MCV 103.6 (H) 80.0 - 100.0 fL   MCH 30.4 26.0 - 34.0 pg   MCHC 29.3 (L) 30.0 - 36.0 g/dL   RDW 16.5 (H) 11.5 - 15.5 %   Platelets 209 150 - 400 K/uL   nRBC 0.0 0.0 - 0.2 %   Neutrophils Relative % 67 %   Neutro Abs 3.5 1.7 - 7.7 K/uL   Lymphocytes Relative 15 %   Lymphs Abs 0.8 0.7 - 4.0 K/uL   Monocytes Relative 14 %   Monocytes Absolute 0.7 0.1 - 1.0 K/uL   Eosinophils Relative 3 %   Eosinophils Absolute 0.1 0.0 - 0.5 K/uL   Basophils Relative 1 %   Basophils Absolute 0.0 0.0 - 0.1 K/uL   Immature Granulocytes 0 %   Abs Immature Granulocytes 0.02 0.00 - 0.07 K/uL  Lipase, blood     Status: None   Collection Time: 01/19/19  7:33 PM  Result Value Ref Range   Lipase 24 11 - 51 U/L  Type and screen [place]     Status: None   Collection Time: 01/19/19  7:33 PM  Result Value Ref Range   ABO/RH(D) O POS    Antibody Screen NEG    Sample Expiration      01/22/2019 Performed at [facility], [place] [address] [place], [place] [address]   APTT     Status: Abnormal   Collection Time: 01/19/19 11:16 PM  Result Value Ref  Range   aPTT 37 (H) 24 - 36 seconds  Protime-INR     Status: None   Collection Time: 01/19/19 11:16 PM  Result Value Ref Range   Prothrombin Time 14.9 11.4 - 15.2 seconds   INR 1.2 0.8 - 1.2  MRSA PCR Screening     Status: None   Collection Time: 01/20/19  3:32 AM  Result Value Ref Range   MRSA by PCR NEGATIVE NEGATIVE  CBC     Status: Abnormal   Collection Time: 01/20/19  5:39 AM  Result Value Ref Range   WBC 4.3 4.0 - 10.5 K/uL   RBC 2.80 (L) 3.87 - 5.11 MIL/uL   Hemoglobin 8.5 (L) 12.0 - 15.0 g/dL   HCT 29.1 (L) 36.0 - 46.0 %   MCV 103.9 (H) 80.0 - 100.0 fL   MCH 30.4 26.0 - 34.0 pg   MCHC 29.2 (L) 30.0 - 36.0 g/dL   RDW 16.1 (H) 11.5 - 15.5 %   Platelets 200 150 - 400 K/uL   nRBC 0.0 0.0 - 0.2 %  Basic metabolic panel     Status: Abnormal   Collection Time: 01/20/19  5:39 AM  Result Value Ref Range   Sodium 142 135 - 145 mmol/L   Potassium 3.5 3.5 - 5.1 mmol/L   Chloride 111 98 - 111 mmol/L   CO2 26 22 - 32 mmol/L   Glucose, Bld 84 70 - 99 mg/dL   BUN 29 (H) 8 - 23 mg/dL   Creatinine, Ser 0.68 0.44 - 1.00 mg/dL   Calcium 8.0 (L) 8.9 - 10.3 mg/dL   GFR calc non Af Amer >60 >60 mL/min   GFR calc Af Amer >60 >60 mL/min   Anion gap 5 5 - 15  Iron and TIBC     Status: Abnormal   Collection Time: 01/20/19  5:39 AM  Result Value Ref Range   Iron 38 28 - 170 ug/dL   TIBC 146 (L) 250 - 450 ug/dL   Saturation Ratios 26 10.4 - 31.8 %   UIBC 108 ug/dL  Ferritin     Status: None   Collection Time: 01/20/19  5:39 AM  Result Value Ref Range   Ferritin 82 11 - 307 ng/mL     No results found.  ROS:  As stated above in the HPI otherwise negative.  Blood pressure (!) 163/96, pulse 71, temperature 98.5 F (36.9 C), temperature source Oral, resp. rate 18, height 5\' 7"  (1.702 m), weight 62 kg, SpO2 99 %.    PE: Gen: NAD, Alert and Oriented HEENT:  [place]/AT, EOMI Neck: Supple, no LAD Lungs: CTA Bilaterally CV: RRR without M/G/R ABM: Soft, NTND, +BS Ext: No  C/C/E  Assessment/Plan: 1) Diverticular bleed - probable. 2) Anemia. 3) Hematochezia.   She remains stable.  If she does not have bleeding for the next 24 hours she can be discharged home.  As always, this is an unpredictable disease course.  Bleeding can recur at any time.  Plan: 1) Follow HGB. 2) Transfuse as necessary. 3) If there is rebleeding, a CTA will be the optimal test.  Shareka Casale D 01/20/2019, 1:20 PM

## 2019-01-20 NOTE — Progress Notes (Signed)
PROGRESS NOTE    Margaret Li  AGT:[id number] DOB: 28-Mar-1929 DOA: 01/19/2019 PCP: Reymundo Poll, MD   Brief Narrative:  This is an 85 year old woman with mild dementia living in an assisted living facility requiring help with some of her ADLs, anxiety/depression, hypertension, hypothyroidism, who presents with bleeding per rectum.  The patient underwent upper endoscopy in September 2017 which revealed duodenal lipoma, erythematous mucosa in the gastric antrum, nonbleeding diverticula in the upper GI tract.  In 2016 she was admitted with rectal bleeding hemoglobin nadir between 7 and 8, was given packed red blood cell transfusion at that time.  The patient reports at this time she had an episode of bright red blood per rectum, warranting medical evaluation.  She reports having some right sided hemi-abdomen pain, transient in nature, mild in intensity, lasted for minutes.  She does report she struggles with constipation on a fairly regular basis.   The emergency medicine team perform rectal exam which revealed bright red blood on the rectal exam.  Hospital medicine was consulted for further management   Assessment & Plan:   Active Problems:   GIB (gastrointestinal bleeding)   Acute GI bleeding: Probably diverticular bleeding.  Discussed with DR Benson [place] and recommended no furthe work up at this time.  Transfuse to keep hemoglobin greater than 7.  Advance diet as tolerated.  Continue with PPI BID.     Dementia:  Stable.    hypertension: well controlled.   Infra renal AAA Yearly [place] recommended.       DVT prophylaxis: SCD'S Code Status: DNR Family Communication: none at bedside.  Disposition Plan:pending clinical improvement.    Consultants:   Gastroenterology Dr Benson [place].    Procedures: none    Antimicrobials: None.   Subjective: No abdominal pain today. Wants to eat solid food.   Objective: Vitals:   01/19/19 2140 01/19/19 2200 01/19/19 2232 01/20/19 0711  BP:  125/84 123/77 (!) 144/93 (!) 163/96  Pulse: 78 76 71 71  Resp: 15 16 16 18   Temp:   97.9 F (36.6 C) 98.5 F (36.9 C)  TempSrc:   Oral Oral  SpO2: 100% 99% 100% 99%  Weight:   62 kg   Height:   5\' 7"  (1.702 m)     Intake/Output Summary (Last 24 hours) at 01/20/2019 1429 Last data filed at 01/20/2019 0100 Gross per 24 hour  Intake 72.43 ml  Output -  Net 72.43 ml   Filed Weights   01/19/19 1922 01/19/19 2232  Weight: 59 kg 62 kg    Examination:  General exam: Appears calm and comfortable  Respiratory system: Clear to auscultation. Respiratory effort normal. Cardiovascular system: S1 & S2 heard, RRR. No JVD, murmurs, rubs, gallops or clicks. No pedal edema. Gastrointestinal system: Abdomen is nondistended, soft and nontender. No organomegaly or masses felt. Normal bowel sounds heard. Central nervous system: Alert and oriented. No focal neurological deficits. Extremities: Symmetric 5 x 5 power. Skin: No rashes, lesions or ulcers Psychiatry: Judgement and insight appear normal. Mood & affect appropriate.     Data Reviewed: I have personally reviewed following labs and imaging studies  CBC: Recent Labs  Lab 01/19/19 1933 01/20/19 0539  WBC 5.1 4.3  NEUTROABS 3.5  --   HGB 9.2* 8.5*  HCT 31.4* 29.1*  MCV 103.6* 103.9*  PLT 209 224   Basic Metabolic Panel: Recent Labs  Lab 01/19/19 1933 01/20/19 0539  NA 144 142  K 3.8 3.5  CL 113* 111  CO2 27 26  GLUCOSE  102* 84  BUN 31* 29*  CREATININE 0.81 0.68  CALCIUM 8.1* 8.0*   GFR: Estimated Creatinine Clearance: 48.2 mL/min (by C-G formula based on SCr of 0.68 mg/dL). Liver Function Tests: Recent Labs  Lab 01/19/19 1933  AST 15  ALT 10  ALKPHOS 57  BILITOT 0.7  PROT 5.7*  ALBUMIN 3.0*   Recent Labs  Lab 01/19/19 1933  LIPASE 24   No results for input(s): AMMONIA in the last 168 hours. Coagulation Profile: Recent Labs  Lab 01/19/19 2316  INR 1.2   Cardiac Enzymes: No results for input(s):  CKTOTAL, CKMB, CKMBINDEX, TROPONINI in the last 168 hours. BNP (last 3 results) No results for input(s): PROBNP in the last 8760 hours. HbA1C: No results for input(s): HGBA1C in the last 72 hours. CBG: No results for input(s): GLUCAP in the last 168 hours. Lipid Profile: No results for input(s): CHOL, HDL, LDLCALC, TRIG, CHOLHDL, LDLDIRECT in the last 72 hours. Thyroid Function Tests: No results for input(s): TSH, T4TOTAL, FREET4, T3FREE, THYROIDAB in the last 72 hours. Anemia Panel: Recent Labs    01/20/19 0539  FERRITIN 82  TIBC 146*  IRON 38   Sepsis Labs: No results for input(s): PROCALCITON, LATICACIDVEN in the last 168 hours.  Recent Results (from the past 240 hour(s))  MRSA PCR Screening     Status: None   Collection Time: 01/20/19  3:32 AM  Result Value Ref Range Status   MRSA by PCR NEGATIVE NEGATIVE Final    Comment:        The GeneXpert MRSA Assay (FDA approved for NASAL specimens only), is one component of a comprehensive MRSA colonization surveillance program. It is not intended to diagnose MRSA infection nor to guide or monitor treatment for MRSA infections. Performed at [facility], [place] [address] [place], [place] [address]          Radiology Studies: No results found.      Scheduled Meds: . antiseptic oral rinse  30 mL Mouth Rinse BID  . dorzolamide-timolol  1 drop Both Eyes BID  . levothyroxine  50 mcg Oral Q0600  . losartan  100 mg Oral QHS  . mirtazapine  7.5 mg Oral QHS  . Netarsudil Dimesylate  1 drop Both Eyes QHS  . OLANZapine  7.5 mg Oral QHS  . pantoprazole (PROTONIX) IV  40 mg Intravenous Q12H  . sertraline  50 mg Oral Daily  . sodium chloride flush  3 mL Intravenous Q12H  . vitamin B-12  1,000 mcg Oral Daily   Continuous Infusions:   LOS: 0 days    Time spent: [place], MD [facility] Pager [phone]  If 7PM-7AM, please contact night-coverage [email or web address] Password  [facility] 01/20/2019, 2:29 PM

## 2019-01-21 DIAGNOSIS — K922 Gastrointestinal hemorrhage, unspecified: Secondary | ICD-10-CM | POA: Diagnosis not present

## 2019-01-21 DIAGNOSIS — K625 Hemorrhage of anus and rectum: Secondary | ICD-10-CM | POA: Diagnosis not present

## 2019-01-21 LAB — HEMOGLOBIN AND HEMATOCRIT, BLOOD
HCT: 30.2 % — ABNORMAL LOW (ref 36.0–46.0)
Hemoglobin: 9.4 g/dL — ABNORMAL LOW (ref 12.0–15.0)

## 2019-01-21 MED ORDER — PANTOPRAZOLE SODIUM 40 MG PO TBEC: 40.0000 mg | DELAYED_RELEASE_TABLET | Freq: Every day | ORAL | Status: DC

## 2019-01-21 NOTE — Evaluation (Signed)
Physical Therapy Evaluation Patient Details Name: Margaret Li MRN: [id number] DOB: [birth year] Today's Date: 01/21/2019   History of Present Illness  85 year old woman with mild dementia living in an assisted living facility requiring help with some of her ADLs, anxiety/depression, hypertension, hypothyroidism, who presents with GI Bleed  Clinical Impression  Pt admitted with above diagnosis. Pt currently with functional limitations due to the deficits listed below (see PT Problem List).  Unable to determine pt baseline, currently she is quite lethargic but arouses with multi-modal stimuli; pt has purewick in place and and pt's gown, bed pads (including paper pads) as well as the bed sheets  are soaked in urine with copious amounts of urine; NT made aware Will continue to follow in acute setting, may need SNF if pt ALF unable to provide needed assist  Pt will benefit from skilled PT to increase their independence and safety with mobility to allow discharge to the venue listed below.       Follow Up Recommendations SNF(unless ALF able to provide necessary level of care)    Equipment Recommendations  None recommended by PT    Recommendations for Other Services       Precautions / Restrictions Precautions Precautions: Fall Restrictions Weight Bearing Restrictions: No      Mobility  Bed Mobility Overal bed mobility: Needs Assistance Bed Mobility: Supine to Sit;Sit to Supine     Supine to sit: Mod assist Sit to supine: Mod assist   General bed mobility comments: assist with LEs on and off bed, incr time required  Transfers                 General transfer comment: lateral scooting along EOB with mod assist  Ambulation/Gait                Stairs            Wheelchair Mobility    Modified Rankin (Stroke Patients Only)       Balance Overall balance assessment: Needs assistance   Sitting balance-Leahy Scale: Fair Sitting balance - Comments:  fatigues quickly in sitting, able to sit for only ~20minutes with min/guard to close supervision for safety. falling asleep in sitting                                     Pertinent Vitals/Pain Pain Assessment: Faces Faces Pain Scale: Hurts little more Pain Location: grimaces with movement Pain Descriptors / Indicators: Grimacing Pain Intervention(s): Monitored during session;Repositioned    Home Living Family/patient expects to be discharged to:: Assisted living               Home Equipment: Walker - 4 wheels Additional Comments: info above taken from chart, pt is unable to answer questions related to PLOF    Prior Function Level of Independence: Needs assistance   Gait / Transfers Assistance Needed: per chart pt was ambulatory in January 2020; she states at this time she requires assist with transfers from bed to chair (unsure of accuracy of this)           Hand Dominance        Extremity/Trunk Assessment   Upper Extremity Assessment Upper Extremity Assessment: Generalized weakness    Lower Extremity Assessment Lower Extremity Assessment: Generalized weakness       Communication   Communication: No difficulties  Cognition Arousal/Alertness: Lethargic Behavior During Therapy: Flat affect Overall Cognitive Status: History of  cognitive impairments - at baseline Area of Impairment: Orientation                 Orientation Level: Disoriented to;Time;Situation;Place             General Comments: pt is very sleepy;       General Comments      Exercises     Assessment/Plan    PT Assessment Patient needs continued PT services  PT Problem List Decreased strength;Decreased balance;Decreased activity tolerance;Decreased cognition       PT Treatment Interventions DME instruction;Gait training;Functional mobility training;Therapeutic activities;Patient/family education;Balance training;Therapeutic exercise    PT Goals (Current  goals can be found in the Care Plan section)  Acute Rehab PT Goals Patient Stated Goal: unable to state PT Goal Formulation: Patient unable to participate in goal setting Time For Goal Achievement: 02/04/19 Potential to Achieve Goals: Fair    Frequency Min 2X/week   Barriers to discharge        Co-evaluation               AM-PAC PT "6 Clicks" Mobility  Outcome Measure Help needed turning from your back to your side while in a flat bed without using bedrails?: A Lot Help needed moving from lying on your back to sitting on the side of a flat bed without using bedrails?: A Lot Help needed moving to and from a bed to a chair (including a wheelchair)?: A Lot Help needed standing up from a chair using your arms (e.g., wheelchair or bedside chair)?: Total Help needed to walk in hospital room?: Total Help needed climbing 3-5 steps with a railing? : Total 6 Click Score: 9    End of Session   Activity Tolerance: Patient limited by fatigue Patient left: in bed;with call bell/phone within reach;with bed alarm set   PT Visit Diagnosis: Muscle weakness (generalized) (M62.81)    Time: 9166-0600 PT Time Calculation (min) (ACUTE ONLY): 14 min   Charges:   PT Evaluation $PT Eval Low Complexity: 1 Low          Kenyon Ana, PT  Pager: [phone] Acute Rehab Dept [facility]): [phone]   01/21/2019   [facility] 01/21/2019, 2:06 PM

## 2019-01-21 NOTE — Care Management Obs Status (Signed)
[place] NOTIFICATION   Patient Details  Name: YUVIA PLANT MRN: [id number] Date of Birth: January 31, 1929   Medicare Observation Status Notification Given:  Yes    Leeroy Cha, RN 01/21/2019, 10:38 AM

## 2019-01-21 NOTE — Discharge Summary (Addendum)
Physician Discharge Summary  JAHDAI PADOVANO ZGY:[id number] DOB: 1929/10/23 DOA: 01/19/2019  PCP: Reymundo Poll, MD  Admit date: 01/19/2019 Discharge date: 01/22/2019  Admitted From: ALF. Disposition:  ALF WITH Home health vs SNF.   Recommendations for Outpatient Follow-up:  1. Follow up with PCP in 1-2 weeks 2. Please obtain BMP/CBC in one week 3. Please follow up with gastroenterology as needed.   Home Health:Yes   Discharge Condition:stable.  CODE STATUS:DNR Diet recommendation: Heart Healthy   Brief/Interim Summary: This is an 85 year old woman with mild dementia living in an assisted living facility requiring help with some of her ADLs, anxiety/depression, hypertension, hypothyroidism, who presents with bleeding per rectum.  The patient underwent upper endoscopy in September 2017 which revealed duodenal lipoma, erythematous mucosa in the gastric antrum, nonbleeding diverticula in the upper GI tract. In 2016 she was admitted with rectal bleeding hemoglobin nadir between 7 and 8, was given packed red blood cell transfusion at that time. The patient reports at this time she had an episode of bright red blood per rectum, warranting medical evaluation. She reports having some right sided hemi-abdomen pain, transient in nature, mild in intensity, lasted for minutes. She does report she struggles with constipation on a fairly regular basis.  The emergency medicine team perform rectal exam which revealed bright red blood on the rectal exam. Hospital medicine was consulted for further management  Discharge Diagnoses:  Active Problems:   GIB (gastrointestinal bleeding)  Acute GI bleeding: Probably diverticular bleeding.  Discussed with DR Benson Norway and recommended no furthe work up at this time. No more rectal bleeding noticed.  Hemoglobin stable between 8 to 9.  Advance diet as tolerated.  Continue with PPI on discharge.     Dementia:  Stable.    Hypertension: well  controlled.   Infra renal AAA Yearly [place] abd  recommended.   h/o B12 deficiency:  Resume supplements on discharge.    Anxiety and Depression:  Resume home meds on discharge.     Discharge Instructions   Allergies as of 01/21/2019      Reactions   Trazodone And Nefazodone Anaphylaxis   Clarithromycin    GI side effects Other reaction(s): Other GI side effects   Codeine Other (See Comments)   GI side effects Other reaction(s): Other GI side effects   Duloxetine Hcl    GI side effects Other reaction(s): Other GI side effects   Escitalopram Oxalate Nausea Only, Other (See Comments)   dizziness Other reaction(s): Dizziness   Fluoxetine    Other reaction(s): Other Abnormal weight loss; GI side effects   Ibandronic Acid    Groin pain Other reaction(s): Other Groin pain   Sulfa Antibiotics Other (See Comments)   Unknown reaction  Other reaction(s): Unknown   Norvasc [amlodipine Besylate] Other (See Comments)   Abdominal pain, bad dreams   Prozac [fluoxetine Hcl] Other (See Comments)   Abnormal weight loss, GI upset    Trazodone Hcl Swelling, Rash   Swollen tongue      Medication List    STOP taking these medications   cephALEXin 250 MG capsule Commonly known as:  KEFLEX   simvastatin 10 MG tablet Commonly known as:  ZOCOR     TAKE these medications   acetaminophen 500 MG tablet Commonly known as:  TYLENOL Take 500 mg by mouth every 6 (six) hours as needed for mild pain or headache.   Antacid Maximum 400 MG chewable tablet Generic drug:  calcium elemental as carbonate Chew 1,000 mg by mouth at  bedtime.   antiseptic oral rinse Liqd 30 mLs by Mouth Rinse route 2 (two) times daily.   aspirin 81 MG EC tablet Take 1 tablet (81 mg total) by mouth daily.   bisacodyl 10 MG suppository Commonly known as:  DULCOLAX Place 1 suppository (10 mg total) rectally every Sunday.   busPIRone 5 MG tablet Commonly known as:  BUSPAR Take 2.5 mg by mouth 2 (two)  times daily.   dorzolamide-timolol 22.3-6.8 MG/ML ophthalmic solution Commonly known as:  COSOPT Place 1 drop into both eyes 2 (two) times daily.   Ensure Take 237 mLs by mouth 2 (two) times daily. Chocolate   fluticasone 50 MCG/ACT nasal spray Commonly known as:  FLONASE Place 1 spray into both nostrils daily.   hydrALAZINE 10 MG tablet Commonly known as:  APRESOLINE Take 1 tablet (10 mg total) by mouth 3 (three) times daily for 30 days.   labetalol 100 MG tablet Commonly known as:  NORMODYNE Take 100 mg by mouth 2 (two) times daily. Hold for SBP <100 or pulse <55   levothyroxine 50 MCG tablet Commonly known as:  SYNTHROID, LEVOTHROID Take 50 mcg by mouth daily before breakfast.   losartan 100 MG tablet Commonly known as:  COZAAR Take 100 mg by mouth at bedtime.   magnesium oxide 400 MG tablet Commonly known as:  MAG-OX Take 1 tablet (400 mg total) by mouth daily.   memantine 10 MG tablet Commonly known as:  NAMENDA Take 5 mg by mouth 2 (two) times daily.   mirtazapine 7.5 MG tablet Commonly known as:  REMERON Take 7.5 mg by mouth at bedtime.   Multivitamin Women 50+ Tabs Take 1 tablet by mouth daily.   Netarsudil Dimesylate 0.02 % Soln Place 1 drop into both eyes at bedtime. "Rhopressa"   nitroGLYCERIN 0.4 MG SL tablet Commonly known as:  NITROSTAT Place 1 tablet (0.4 mg total) under the tongue every 5 (five) minutes as needed for chest pain (q69mins up to three times).   OLANZapine 7.5 MG tablet Commonly known as:  ZYPREXA Take 7.5 mg by mouth at bedtime.   pantoprazole 40 MG tablet Commonly known as:  PROTONIX Take 1 tablet (40 mg total) by mouth daily.   polyethylene glycol packet Commonly known as:  MIRALAX / GLYCOLAX Take 17 g by mouth 2 (two) times daily. What changed:    when to take this  reasons to take this   sennosides-docusate sodium 8.6-50 MG tablet Commonly known as:  SENOKOT-S Take 1 tablet by mouth daily.   sertraline 50 MG  tablet Commonly known as:  ZOLOFT Take 50 mg by mouth daily.   vitamin B-12 1000 MCG tablet Commonly known as:  CYANOCOBALAMIN Take 1,000 mcg by mouth daily.       Allergies  Allergen Reactions  . Trazodone And Nefazodone Anaphylaxis  . Clarithromycin     GI side effects Other reaction(s): Other GI side effects  . Codeine Other (See Comments)    GI side effects Other reaction(s): Other GI side effects  . Duloxetine Hcl     GI side effects Other reaction(s): Other GI side effects  . Escitalopram Oxalate Nausea Only and Other (See Comments)    dizziness Other reaction(s): Dizziness  . Fluoxetine     Other reaction(s): Other Abnormal weight loss; GI side effects  . Ibandronic Acid     Groin pain Other reaction(s): Other Groin pain  . Sulfa Antibiotics Other (See Comments)    Unknown reaction  Other reaction(s): Unknown  .  Norvasc [Amlodipine Besylate] Other (See Comments)    Abdominal pain, bad dreams  . Prozac [Fluoxetine Hcl] Other (See Comments)    Abnormal weight loss, GI upset   . Trazodone Hcl Swelling and Rash    Swollen tongue     Consultations:  Dr Benson Norway with Gastroenterology   Procedures/Studies: No results found.    Subjective: No rectal bleeding today.   Discharge Exam: Vitals:   01/21/19 1433 01/21/19 1944  BP: 112/82 124/87  Pulse: 79 69  Resp: 16 (!) 22  Temp: 98.6 F (37 C) 98.4 F (36.9 C)  SpO2: 98% 98%   Vitals:   01/20/19 2001 01/21/19 0452 01/21/19 1433 01/21/19 1944  BP: 120/78 (!) 158/101 112/82 124/87  Pulse: 71 69 79 69  Resp: 16 16 16  (!) 22  Temp: (!) 97.4 F (36.3 C) 97.8 F (36.6 C) 98.6 F (37 C) 98.4 F (36.9 C)  TempSrc:   Oral Oral  SpO2: 99% 98% 98% 98%  Weight:      Height:        General: Pt is alert, awake, not in acute distress Cardiovascular: RRR, S1/S2 +, no rubs, no gallops Respiratory: CTA bilaterally, no wheezing, no rhonchi Abdominal: Soft, NT, ND, bowel sounds + Extremities: no edema,  no cyanosis    The results of significant diagnostics from this hospitalization (including imaging, microbiology, ancillary and laboratory) are listed below for reference.     Microbiology: Recent Results (from the past 240 hour(s))  MRSA PCR Screening     Status: None   Collection Time: 01/20/19  3:32 AM  Result Value Ref Range Status   MRSA by PCR NEGATIVE NEGATIVE Final    Comment:        The GeneXpert MRSA Assay (FDA approved for NASAL specimens only), is one component of a comprehensive MRSA colonization surveillance program. It is not intended to diagnose MRSA infection nor to guide or monitor treatment for MRSA infections. Performed at [facility], [place] [address] [place], [place] [address]      Labs: BNP (last 3 results) No results for input(s): BNP in the last 8760 hours. Basic Metabolic Panel: Recent Labs  Lab 01/19/19 1933 01/20/19 0539  NA 144 142  K 3.8 3.5  CL 113* 111  CO2 27 26  GLUCOSE 102* 84  BUN 31* 29*  CREATININE 0.81 0.68  CALCIUM 8.1* 8.0*   Liver Function Tests: Recent Labs  Lab 01/19/19 1933  AST 15  ALT 10  ALKPHOS 57  BILITOT 0.7  PROT 5.7*  ALBUMIN 3.0*   Recent Labs  Lab 01/19/19 1933  LIPASE 24   No results for input(s): AMMONIA in the last 168 hours. CBC: Recent Labs  Lab 01/19/19 1933 01/20/19 0539 01/21/19 0911  WBC 5.1 4.3  --   NEUTROABS 3.5  --   --   HGB 9.2* 8.5* 9.4*  HCT 31.4* 29.1* 30.2*  MCV 103.6* 103.9*  --   PLT 209 200  --    Cardiac Enzymes: No results for input(s): CKTOTAL, CKMB, CKMBINDEX, TROPONINI in the last 168 hours. BNP: Invalid input(s): POCBNP CBG: No results for input(s): GLUCAP in the last 168 hours. D-Dimer No results for input(s): DDIMER in the last 72 hours. Hgb A1c No results for input(s): HGBA1C in the last 72 hours. Lipid Profile No results for input(s): CHOL, HDL, LDLCALC, TRIG, CHOLHDL, LDLDIRECT in the last 72 hours. Thyroid function  studies No results for input(s): TSH, T4TOTAL, T3FREE, THYROIDAB in the last  72 hours.  Invalid input(s): FREET3 Anemia work up Recent Labs    01/20/19 0539  FERRITIN 82  TIBC 146*  IRON 38   Urinalysis    Component Value Date/Time   COLORURINE AMBER (A) 11/30/2018 1719   APPEARANCEUR HAZY (A) 11/30/2018 1719   LABSPEC 1.031 (H) 11/30/2018 1719   PHURINE 5.0 11/30/2018 1719   GLUCOSEU NEGATIVE 11/30/2018 1719   HGBUR NEGATIVE 11/30/2018 1719   BILIRUBINUR SMALL (A) 11/30/2018 1719   KETONESUR 5 (A) 11/30/2018 1719   PROTEINUR NEGATIVE 11/30/2018 1719   UROBILINOGEN 1.0 02/16/2010 1215   NITRITE NEGATIVE 11/30/2018 1719   LEUKOCYTESUR NEGATIVE 11/30/2018 1719   Sepsis Labs Invalid input(s): PROCALCITONIN,  WBC,  LACTICIDVEN Microbiology Recent Results (from the past 240 hour(s))  MRSA PCR Screening     Status: None   Collection Time: 01/20/19  3:32 AM  Result Value Ref Range Status   MRSA by PCR NEGATIVE NEGATIVE Final    Comment:        The GeneXpert MRSA Assay (FDA approved for NASAL specimens only), is one component of a comprehensive MRSA colonization surveillance program. It is not intended to diagnose MRSA infection nor to guide or monitor treatment for MRSA infections. Performed at [facility], [place] [address], [place], [place] [address]      Time coordinating discharge: 32 minutes  SIGNED:   Hosie Poisson, MD  [facility] 01/21/2019, 9:40 PM Pager   If 7PM-7AM, please contact night-coverage [email or web address] Password [email or web address]

## 2019-01-21 NOTE — TOC Initial Note (Signed)
Transition of Care [facility]) - Initial/Assessment Note    Patient Details  Name: Margaret Li MRN: [id number] Date of Birth: 10/29/1929  [facility]) CM/SW Contact:    Wende Neighbors, LCSW Phone Number: 01/21/2019, 4:27 PM  Clinical Narrative: CSW met patient at bedside to discuss discharge plans. Patient stated she lives at [facility] and have been at the facility for 3-4 months. Patient stated she does not have family in the area since her children are out of state.  Patient stated she does not mind returning back , saying she does not have any where else to go to.  CSW spoke with Alfredo Bach and spoke with Imane. Imane stated that patient can come back but facility will be unable to take patient back if diet is "soft diet. CSW made MD aware and MD stated she will change diet to regular diet. CSW to assist with patient discharge back to facility.                 Expected Discharge Plan: Assisted Living Barriers to Discharge: Continued Medical Work up   Patient Goals and CMS Choice   CMS Medicare.gov Compare Post Acute Care list provided to:: Other (Comment Required)(going back home) Choice offered to / list presented to : Patient(wants to return back to facility)  Expected Discharge Plan and Services Expected Discharge Plan: Assisted Living     Living arrangements for the past 2 months: [place]                          Prior Living Arrangements/Services Living arrangements for the past 2 months: [place] Lives with:: Self Patient language and need for interpreter reviewed:: Yes        Need for Family Participation in Patient Care: Yes (Comment) Care giver support system in place?: Yes (comment)   Criminal Activity/Legal Involvement Pertinent to Current Situation/Hospitalization: No - Comment as needed  Activities of Daily Living Home Assistive Devices/Equipment: Eyeglasses, Walker (specify type) ADL Screening  (condition at time of admission) Patient's cognitive ability adequate to safely complete daily activities?: Yes Is the patient deaf or have difficulty hearing?: No Does the patient have difficulty seeing, even when wearing glasses/contacts?: No Does the patient have difficulty concentrating, remembering, or making decisions?: No Patient able to express need for assistance with ADLs?: Yes Does the patient have difficulty dressing or bathing?: Yes Independently performs ADLs?: No Communication: Independent Dressing (OT): Needs assistance Is this a change from baseline?: Change from baseline, expected to last <3days Grooming: Independent Feeding: Independent Bathing: Needs assistance Is this a change from baseline?: Change from baseline, expected to last <3 days Toileting: Needs assistance, Independent with device (comment) Is this a change from baseline?: Change from baseline, expected to last <3 days In/Out Bed: Independent with device (comment), Needs assistance Is this a change from baseline?: Change from baseline, expected to last <3 days Walks in Home: Independent with device (comment) Does the patient have difficulty walking or climbing stairs?: Yes Weakness of Legs: Both Weakness of Arms/Hands: None  Permission Sought/Granted Permission sought to share information with : Family Supports    Share Information with NAME: Forest Becker  Permission granted to share info w AGENCY: [facility]  Permission granted to share info w Relationship: daughter  Permission granted to share info w Contact Information: [phone]  Emotional Assessment Appearance:: Appears stated age Attitude/Demeanor/Rapport: Engaged Affect (typically observed): Accepting Orientation: : Oriented to Self,  Oriented to Place, Oriented to Situation, Oriented to  Time Alcohol / Substance Use: Not Applicable Psych Involvement: No (comment)  Admission diagnosis:  Rectal bleeding [K62.5] Patient Active Problem  List   Diagnosis Date Noted  . GIB (gastrointestinal bleeding) 01/19/2019  . Hypokalemia   . Hypomagnesemia   . Dysphagia   . Pressure injury of skin 11/10/2018  . Syncope 11/09/2018  . AAA (abdominal aortic aneurysm) ([place]) 11/09/2018  . CKD (chronic kidney disease), stage II 11/09/2018  . History of CVA (cerebrovascular accident) 11/09/2018  . Orthostasis 11/09/2018  . Dementia ([place]) 11/09/2018  . Major depressive disorder with psychotic features ([place]) 05/11/2018  . Vision loss, left eye 02/17/2017  . Visual hallucinations 02/17/2017  . Neuropathy 02/17/2017  . Hypertension 06/14/2015  . Hypothyroidism 06/14/2015  . Glaucoma 06/14/2015  . Rectal bleeding 06/13/2015   PCP:  Reymundo Poll, MD Pharmacy:   [facility] [place], [place] - [place] [address] AT [place] [address] Phone: [phone] Fax: [place], [facility] [address] [place] [address] [place] [address] Phone: [phone] Fax: [phone]     Social Determinants of Health ([place]) Interventions    Readmission Risk Interventions  No flowsheet data found.

## 2019-01-22 ENCOUNTER — Encounter (HOSPITAL_COMMUNITY): Payer: Self-pay

## 2019-01-22 DIAGNOSIS — R69 Illness, unspecified: Secondary | ICD-10-CM | POA: Diagnosis not present

## 2019-01-22 DIAGNOSIS — R58 Hemorrhage, not elsewhere classified: Secondary | ICD-10-CM | POA: Diagnosis not present

## 2019-01-22 DIAGNOSIS — G459 Transient cerebral ischemic attack, unspecified: Secondary | ICD-10-CM | POA: Diagnosis not present

## 2019-01-22 NOTE — Progress Notes (Signed)
Pt slept thru dinner, this RN & a NT tried to awaken pt & offered to assist  her eat but she would fall back asleep. Pt mumbled she did not want to eat. Pt remained asleep / lethargic requiring this RN repeated attempts at waking pt up to take her 2200 meds. Meds were off schedule due to this, VSS & will continue to monitor

## 2019-01-22 NOTE — Progress Notes (Signed)
OT Cancellation Note  Patient Details Name: Margaret Li MRN: [id number] DOB: May 18, 1929   Cancelled Treatment:   Noted plans to go to ALF this day. Will defer OT eval to ALF.  Kari Baars, OT Acute Rehabilitation Services [phone] Office- [phone], Edwena Felty D 01/22/2019, 1:11 PM

## 2019-01-22 NOTE — Progress Notes (Signed)
Patient seen and examined this morning, stable for discharge, please refer to DC summary by Dr. Karleen Hampshire.  She has no further bleeding, hemoglobin this morning stable at 9.4 improved when compared to yesterday at 8.5  BP (!) 172/98 (BP Location: Right Arm)   Pulse 63   Temp 98.4 F (36.9 C) (Oral)   Resp 16   Ht 5\' 7"  (1.702 m)   Wt 62 kg   SpO2 94%   BMI 21.41 kg/m   Margaret Bradly M. Cruzita Lederer, MD, PhD [facility]  Contact via  [email or web address]  [place] P: [phone]  F: [phone]

## 2019-01-22 NOTE — Progress Notes (Signed)
Left message for Kathyrn Drown, RRC at [facility] regarding patient report. Awaiting call back.

## 2019-01-22 NOTE — TOC Transition Note (Signed)
Transition of Care [facility]) - CM/SW Discharge Note   Patient Details  Name: Margaret Li MRN: [id number] Date of Birth: 03-17-1929  Transition of Care [facility]) CM/SW Contact:  Servando Snare, LCSW Phone Number: 01/22/2019, 1:56 PM   Clinical Narrative:   Patient returning to [facility].  LCSW faxed dc paper wrk to facility.   Per [facility] at [facility] are acceptable and patient can return.   Patient to transport by PTAR  RN report # [phone]    Final next level of care: Assisted Living Barriers to Discharge: No Barriers Identified   Patient Goals and CMS Choice   CMS Medicare.gov Compare Post Acute Care list provided to:: Other (Comment Required) Choice offered to / list presented to : NA  Discharge Placement              [facility] at[facility] Patient to be transferred to facility by: EMS Name of family member notified: POA Patient and family notified of of transfer: 01/22/19  Discharge Plan and Services                        Social Determinants of Health (SDOH) Interventions     Readmission Risk Interventions No flowsheet data found.

## 2019-01-22 NOTE — NC FL2 (Signed)
[place] LEVEL OF CARE SCREENING TOOL     IDENTIFICATION  Patient Name: Margaret Li Birthdate: 09/10/1929 Sex: female Admission Date (Current Location): 01/19/2019  [facility] and [place] Number:  Herbalist and Address:  [facility],  [place] [place], [place]      Provider Number: [id number]  Attending Physician Name and Address:  Caren Griffins, MD  Relative Name and Phone Number:  Forest Becker, [phone]    Current Level of Care: Hospital Recommended Level of Care: Assisted Living Facility(back to [facility]) Prior Approval Number:    Date Approved/Denied:   PASRR Number:    Discharge Plan: Home(back home to [facility])    Current Diagnoses: Patient Active Problem List   Diagnosis Date Noted  . GIB (gastrointestinal bleeding) 01/19/2019  . Hypokalemia   . Hypomagnesemia   . Dysphagia   . Pressure injury of skin 11/10/2018  . Syncope 11/09/2018  . AAA (abdominal aortic aneurysm) ([place]) 11/09/2018  . CKD (chronic kidney disease), stage II 11/09/2018  . History of CVA (cerebrovascular accident) 11/09/2018  . Orthostasis 11/09/2018  . Dementia ([place]) 11/09/2018  . Major depressive disorder with psychotic features ([place]) 05/11/2018  . Vision loss, left eye 02/17/2017  . Visual hallucinations 02/17/2017  . Neuropathy 02/17/2017  . Hypertension 06/14/2015  . Hypothyroidism 06/14/2015  . Glaucoma 06/14/2015  . Rectal bleeding 06/13/2015    Orientation RESPIRATION BLADDER Height & Weight     Self, Time, Situation, Place  Normal Incontinent Weight: 136 lb 11 oz (62 kg) Height:  5\' 7"  (170.2 cm)  BEHAVIORAL SYMPTOMS/MOOD NEUROLOGICAL BOWEL NUTRITION STATUS      Incontinent Diet(regular)  AMBULATORY STATUS COMMUNICATION OF NEEDS Skin   Limited Assist Verbally                         Personal Care Assistance Level of Assistance  Bathing, Feeding, Dressing Bathing Assistance: Limited  assistance Feeding assistance: Independent Dressing Assistance: Limited assistance     Functional Limitations Info  Sight, Hearing, Speech Sight Info: Adequate Hearing Info: Adequate Speech Info: Adequate    SPECIAL CARE FACTORS FREQUENCY  PT (By licensed PT), OT (By licensed OT)     PT Frequency: 3x wk home health OT Frequency: 3x wk home health            Contractures Contractures Info: Not present    Additional Factors Info  Code Status, Allergies, Isolation Precautions Code Status Info: DNR Allergies Info: TRAZODONE AND NEFAZODONE, CLARITHROMYCIN, CODEINE, DULOXETINE HCL, ESCITALOPRAM OXALATE, FLUOXETINE, IBANDRONIC ACID, SULFA ANTIBIOTICS, NORVASC AMLODIPINE BESYLATE, PROZAC FLUOXETINE HCL, TRAZODONE HCL     Isolation Precautions Info: MRSA     Current Medications (01/22/2019):  This is the current hospital active medication list Current Facility-Administered Medications  Medication Dose Route Frequency Provider Last Rate Last Dose  . acetaminophen (TYLENOL) tablet 500 mg  500 mg Oral Q6H PRN Vilma Prader, MD      . antiseptic oral rinse (BIOTENE) solution 30 mL  30 mL Mouth Rinse BID Vilma Prader, MD   30 mL at 01/21/19 1053  . dorzolamide-timolol (COSOPT) 22.3-6.8 MG/ML ophthalmic solution 1 drop  1 drop Both Eyes BID Vilma Prader, MD   1 drop at 01/21/19 2344  . levothyroxine (SYNTHROID, LEVOTHROID) tablet 50 mcg  50 mcg Oral Q0600 Vilma Prader, MD   50 mcg at 01/22/19 [phone]  . losartan (COZAAR) tablet 100 mg  100 mg Oral  QHS Vilma Prader, MD   100 mg at 01/21/19 2342  . memantine (NAMENDA) tablet 5 mg  5 mg Oral BID Hosie Poisson, MD   5 mg at 01/21/19 2343  . mirtazapine (REMERON) tablet 7.5 mg  7.5 mg Oral QHS Vilma Prader, MD   7.5 mg at 01/21/19 2341  . Netarsudil Dimesylate 0.02 % SOLN 1 drop  1 drop Both Eyes QHS Vilma Prader, MD      . nitroGLYCERIN (NITROSTAT) SL tablet 0.4 mg  0.4 mg Sublingual Q5 min PRN Vilma Prader, MD      . OLANZapine [facility]) tablet 7.5 mg  7.5 mg Oral QHS Vilma Prader, MD   7.5 mg at 01/21/19 2341  . pantoprazole (PROTONIX) EC tablet 40 mg  40 mg Oral Q0600 Hosie Poisson, MD      . sertraline (ZOLOFT) tablet 50 mg  50 mg Oral Daily Vilma Prader, MD   50 mg at 01/21/19 1052  . sodium chloride flush (NS) 0.9 % injection 3 mL  3 mL Intravenous Q12H Vilma Prader, MD   3 mL at 01/21/19 1053  . vitamin B-12 (CYANOCOBALAMIN) tablet 1,000 mcg  1,000 mcg Oral Daily Vilma Prader, MD   1,000 mcg at 01/21/19 1052     Discharge Medications: Please see discharge summary for a list of discharge medications.  Relevant Imaging Results:  Relevant Lab Results:   Additional Information ssn: [id number]  Wende Neighbors, LCSW

## 2019-01-22 NOTE — TOC Progression Note (Signed)
Transition of Care [facility]) - Progression Note    Patient Details  Name: Margaret Li MRN: [id number] Date of Birth: 30-Jan-1929  Transition of Care [facility]) CM/SW Contact  Servando Snare, LCSW Phone Number: 01/22/2019, 11:59 AM  Clinical Narrative:   LCSW faxed dc docs to patients facility. LCSW awaiting approval of return from facility.     Expected Discharge Plan: Assisted Living Barriers to Discharge: Continued Medical Work up  Expected Discharge Plan and Services Expected Discharge Plan: Assisted Living     Living arrangements for the past 2 months: [place] Expected Discharge Date: 01/22/19                         Social Determinants of Health (SDOH) Interventions    Readmission Risk Interventions  No flowsheet data found.

## 2019-01-24 DIAGNOSIS — E039 Hypothyroidism, unspecified: Secondary | ICD-10-CM | POA: Diagnosis not present

## 2019-01-24 DIAGNOSIS — I129 Hypertensive chronic kidney disease with stage 1 through stage 4 chronic kidney disease, or unspecified chronic kidney disease: Secondary | ICD-10-CM | POA: Diagnosis not present

## 2019-01-24 DIAGNOSIS — R69 Illness, unspecified: Secondary | ICD-10-CM | POA: Diagnosis not present

## 2019-01-24 DIAGNOSIS — I714 Abdominal aortic aneurysm, without rupture: Secondary | ICD-10-CM | POA: Diagnosis not present

## 2019-01-24 DIAGNOSIS — G629 Polyneuropathy, unspecified: Secondary | ICD-10-CM | POA: Diagnosis not present

## 2019-01-24 DIAGNOSIS — K219 Gastro-esophageal reflux disease without esophagitis: Secondary | ICD-10-CM | POA: Diagnosis not present

## 2019-01-24 DIAGNOSIS — E785 Hyperlipidemia, unspecified: Secondary | ICD-10-CM | POA: Diagnosis not present

## 2019-01-24 DIAGNOSIS — N182 Chronic kidney disease, stage 2 (mild): Secondary | ICD-10-CM | POA: Diagnosis not present

## 2019-01-24 DIAGNOSIS — L905 Scar conditions and fibrosis of skin: Secondary | ICD-10-CM | POA: Diagnosis not present

## 2019-01-28 DIAGNOSIS — L905 Scar conditions and fibrosis of skin: Secondary | ICD-10-CM | POA: Diagnosis not present

## 2019-01-28 DIAGNOSIS — N182 Chronic kidney disease, stage 2 (mild): Secondary | ICD-10-CM | POA: Diagnosis not present

## 2019-01-28 DIAGNOSIS — K219 Gastro-esophageal reflux disease without esophagitis: Secondary | ICD-10-CM | POA: Diagnosis not present

## 2019-01-28 DIAGNOSIS — G629 Polyneuropathy, unspecified: Secondary | ICD-10-CM | POA: Diagnosis not present

## 2019-01-28 DIAGNOSIS — I129 Hypertensive chronic kidney disease with stage 1 through stage 4 chronic kidney disease, or unspecified chronic kidney disease: Secondary | ICD-10-CM | POA: Diagnosis not present

## 2019-01-28 DIAGNOSIS — R69 Illness, unspecified: Secondary | ICD-10-CM | POA: Diagnosis not present

## 2019-01-28 DIAGNOSIS — E039 Hypothyroidism, unspecified: Secondary | ICD-10-CM | POA: Diagnosis not present

## 2019-01-28 DIAGNOSIS — I714 Abdominal aortic aneurysm, without rupture: Secondary | ICD-10-CM | POA: Diagnosis not present

## 2019-01-28 DIAGNOSIS — E785 Hyperlipidemia, unspecified: Secondary | ICD-10-CM | POA: Diagnosis not present

## 2019-01-31 DIAGNOSIS — L905 Scar conditions and fibrosis of skin: Secondary | ICD-10-CM | POA: Diagnosis not present

## 2019-01-31 DIAGNOSIS — E039 Hypothyroidism, unspecified: Secondary | ICD-10-CM | POA: Diagnosis not present

## 2019-01-31 DIAGNOSIS — N182 Chronic kidney disease, stage 2 (mild): Secondary | ICD-10-CM | POA: Diagnosis not present

## 2019-01-31 DIAGNOSIS — R69 Illness, unspecified: Secondary | ICD-10-CM | POA: Diagnosis not present

## 2019-01-31 DIAGNOSIS — I129 Hypertensive chronic kidney disease with stage 1 through stage 4 chronic kidney disease, or unspecified chronic kidney disease: Secondary | ICD-10-CM | POA: Diagnosis not present

## 2019-01-31 DIAGNOSIS — G629 Polyneuropathy, unspecified: Secondary | ICD-10-CM | POA: Diagnosis not present

## 2019-01-31 DIAGNOSIS — I714 Abdominal aortic aneurysm, without rupture: Secondary | ICD-10-CM | POA: Diagnosis not present

## 2019-01-31 DIAGNOSIS — E785 Hyperlipidemia, unspecified: Secondary | ICD-10-CM | POA: Diagnosis not present

## 2019-01-31 DIAGNOSIS — K219 Gastro-esophageal reflux disease without esophagitis: Secondary | ICD-10-CM | POA: Diagnosis not present

## 2019-02-03 DIAGNOSIS — R69 Illness, unspecified: Secondary | ICD-10-CM | POA: Diagnosis not present

## 2019-02-03 DIAGNOSIS — E039 Hypothyroidism, unspecified: Secondary | ICD-10-CM | POA: Diagnosis not present

## 2019-02-03 DIAGNOSIS — G629 Polyneuropathy, unspecified: Secondary | ICD-10-CM | POA: Diagnosis not present

## 2019-02-03 DIAGNOSIS — E785 Hyperlipidemia, unspecified: Secondary | ICD-10-CM | POA: Diagnosis not present

## 2019-02-03 DIAGNOSIS — I714 Abdominal aortic aneurysm, without rupture: Secondary | ICD-10-CM | POA: Diagnosis not present

## 2019-02-03 DIAGNOSIS — K219 Gastro-esophageal reflux disease without esophagitis: Secondary | ICD-10-CM | POA: Diagnosis not present

## 2019-02-03 DIAGNOSIS — N182 Chronic kidney disease, stage 2 (mild): Secondary | ICD-10-CM | POA: Diagnosis not present

## 2019-02-03 DIAGNOSIS — I129 Hypertensive chronic kidney disease with stage 1 through stage 4 chronic kidney disease, or unspecified chronic kidney disease: Secondary | ICD-10-CM | POA: Diagnosis not present

## 2019-02-03 DIAGNOSIS — L905 Scar conditions and fibrosis of skin: Secondary | ICD-10-CM | POA: Diagnosis not present

## 2019-02-06 DIAGNOSIS — E785 Hyperlipidemia, unspecified: Secondary | ICD-10-CM | POA: Diagnosis not present

## 2019-02-06 DIAGNOSIS — I129 Hypertensive chronic kidney disease with stage 1 through stage 4 chronic kidney disease, or unspecified chronic kidney disease: Secondary | ICD-10-CM | POA: Diagnosis not present

## 2019-02-06 DIAGNOSIS — L905 Scar conditions and fibrosis of skin: Secondary | ICD-10-CM | POA: Diagnosis not present

## 2019-02-06 DIAGNOSIS — I714 Abdominal aortic aneurysm, without rupture: Secondary | ICD-10-CM | POA: Diagnosis not present

## 2019-02-06 DIAGNOSIS — R69 Illness, unspecified: Secondary | ICD-10-CM | POA: Diagnosis not present

## 2019-02-06 DIAGNOSIS — N182 Chronic kidney disease, stage 2 (mild): Secondary | ICD-10-CM | POA: Diagnosis not present

## 2019-02-06 DIAGNOSIS — K219 Gastro-esophageal reflux disease without esophagitis: Secondary | ICD-10-CM | POA: Diagnosis not present

## 2019-02-06 DIAGNOSIS — E039 Hypothyroidism, unspecified: Secondary | ICD-10-CM | POA: Diagnosis not present

## 2019-02-06 DIAGNOSIS — G629 Polyneuropathy, unspecified: Secondary | ICD-10-CM | POA: Diagnosis not present

## 2019-02-10 DIAGNOSIS — N182 Chronic kidney disease, stage 2 (mild): Secondary | ICD-10-CM | POA: Diagnosis not present

## 2019-02-10 DIAGNOSIS — L905 Scar conditions and fibrosis of skin: Secondary | ICD-10-CM | POA: Diagnosis not present

## 2019-02-10 DIAGNOSIS — I714 Abdominal aortic aneurysm, without rupture: Secondary | ICD-10-CM | POA: Diagnosis not present

## 2019-02-10 DIAGNOSIS — K219 Gastro-esophageal reflux disease without esophagitis: Secondary | ICD-10-CM | POA: Diagnosis not present

## 2019-02-10 DIAGNOSIS — G629 Polyneuropathy, unspecified: Secondary | ICD-10-CM | POA: Diagnosis not present

## 2019-02-10 DIAGNOSIS — I129 Hypertensive chronic kidney disease with stage 1 through stage 4 chronic kidney disease, or unspecified chronic kidney disease: Secondary | ICD-10-CM | POA: Diagnosis not present

## 2019-02-10 DIAGNOSIS — E039 Hypothyroidism, unspecified: Secondary | ICD-10-CM | POA: Diagnosis not present

## 2019-02-10 DIAGNOSIS — E785 Hyperlipidemia, unspecified: Secondary | ICD-10-CM | POA: Diagnosis not present

## 2019-02-10 DIAGNOSIS — R69 Illness, unspecified: Secondary | ICD-10-CM | POA: Diagnosis not present

## 2019-02-14 DIAGNOSIS — E039 Hypothyroidism, unspecified: Secondary | ICD-10-CM | POA: Diagnosis not present

## 2019-02-14 DIAGNOSIS — L905 Scar conditions and fibrosis of skin: Secondary | ICD-10-CM | POA: Diagnosis not present

## 2019-02-14 DIAGNOSIS — K219 Gastro-esophageal reflux disease without esophagitis: Secondary | ICD-10-CM | POA: Diagnosis not present

## 2019-02-14 DIAGNOSIS — I129 Hypertensive chronic kidney disease with stage 1 through stage 4 chronic kidney disease, or unspecified chronic kidney disease: Secondary | ICD-10-CM | POA: Diagnosis not present

## 2019-02-14 DIAGNOSIS — E785 Hyperlipidemia, unspecified: Secondary | ICD-10-CM | POA: Diagnosis not present

## 2019-02-14 DIAGNOSIS — N182 Chronic kidney disease, stage 2 (mild): Secondary | ICD-10-CM | POA: Diagnosis not present

## 2019-02-14 DIAGNOSIS — R69 Illness, unspecified: Secondary | ICD-10-CM | POA: Diagnosis not present

## 2019-02-14 DIAGNOSIS — I714 Abdominal aortic aneurysm, without rupture: Secondary | ICD-10-CM | POA: Diagnosis not present

## 2019-02-14 DIAGNOSIS — G629 Polyneuropathy, unspecified: Secondary | ICD-10-CM | POA: Diagnosis not present

## 2019-02-17 DIAGNOSIS — N183 Chronic kidney disease, stage 3 (moderate): Secondary | ICD-10-CM | POA: Diagnosis not present

## 2019-02-17 DIAGNOSIS — R55 Syncope and collapse: Secondary | ICD-10-CM | POA: Diagnosis not present

## 2019-02-17 DIAGNOSIS — Z79899 Other long term (current) drug therapy: Secondary | ICD-10-CM | POA: Diagnosis not present

## 2019-02-17 DIAGNOSIS — R627 Adult failure to thrive: Secondary | ICD-10-CM | POA: Diagnosis not present

## 2019-02-18 DIAGNOSIS — E039 Hypothyroidism, unspecified: Secondary | ICD-10-CM | POA: Diagnosis not present

## 2019-02-18 DIAGNOSIS — K219 Gastro-esophageal reflux disease without esophagitis: Secondary | ICD-10-CM | POA: Diagnosis not present

## 2019-02-18 DIAGNOSIS — L89152 Pressure ulcer of sacral region, stage 2: Secondary | ICD-10-CM | POA: Diagnosis not present

## 2019-02-18 DIAGNOSIS — R69 Illness, unspecified: Secondary | ICD-10-CM | POA: Diagnosis not present

## 2019-02-18 DIAGNOSIS — I129 Hypertensive chronic kidney disease with stage 1 through stage 4 chronic kidney disease, or unspecified chronic kidney disease: Secondary | ICD-10-CM | POA: Diagnosis not present

## 2019-02-18 DIAGNOSIS — I714 Abdominal aortic aneurysm, without rupture: Secondary | ICD-10-CM | POA: Diagnosis not present

## 2019-02-18 DIAGNOSIS — K922 Gastrointestinal hemorrhage, unspecified: Secondary | ICD-10-CM | POA: Diagnosis not present

## 2019-02-18 DIAGNOSIS — E538 Deficiency of other specified B group vitamins: Secondary | ICD-10-CM | POA: Diagnosis not present

## 2019-02-18 DIAGNOSIS — N182 Chronic kidney disease, stage 2 (mild): Secondary | ICD-10-CM | POA: Diagnosis not present

## 2019-02-18 DIAGNOSIS — G629 Polyneuropathy, unspecified: Secondary | ICD-10-CM | POA: Diagnosis not present

## 2019-02-19 DIAGNOSIS — R3989 Other symptoms and signs involving the genitourinary system: Secondary | ICD-10-CM | POA: Diagnosis not present

## 2019-02-19 DIAGNOSIS — Z79899 Other long term (current) drug therapy: Secondary | ICD-10-CM | POA: Diagnosis not present

## 2019-02-19 DIAGNOSIS — G8929 Other chronic pain: Secondary | ICD-10-CM | POA: Diagnosis not present

## 2019-02-19 DIAGNOSIS — R05 Cough: Secondary | ICD-10-CM | POA: Diagnosis not present

## 2019-02-19 DIAGNOSIS — M545 Low back pain: Secondary | ICD-10-CM | POA: Diagnosis not present

## 2019-02-19 DIAGNOSIS — I10 Essential (primary) hypertension: Secondary | ICD-10-CM | POA: Diagnosis not present

## 2019-02-20 DIAGNOSIS — I10 Essential (primary) hypertension: Secondary | ICD-10-CM | POA: Diagnosis not present

## 2019-02-20 DIAGNOSIS — D649 Anemia, unspecified: Secondary | ICD-10-CM | POA: Diagnosis not present

## 2019-02-21 DIAGNOSIS — G629 Polyneuropathy, unspecified: Secondary | ICD-10-CM | POA: Diagnosis not present

## 2019-02-21 DIAGNOSIS — E538 Deficiency of other specified B group vitamins: Secondary | ICD-10-CM | POA: Diagnosis not present

## 2019-02-21 DIAGNOSIS — R69 Illness, unspecified: Secondary | ICD-10-CM | POA: Diagnosis not present

## 2019-02-21 DIAGNOSIS — E039 Hypothyroidism, unspecified: Secondary | ICD-10-CM | POA: Diagnosis not present

## 2019-02-21 DIAGNOSIS — I714 Abdominal aortic aneurysm, without rupture: Secondary | ICD-10-CM | POA: Diagnosis not present

## 2019-02-21 DIAGNOSIS — I129 Hypertensive chronic kidney disease with stage 1 through stage 4 chronic kidney disease, or unspecified chronic kidney disease: Secondary | ICD-10-CM | POA: Diagnosis not present

## 2019-02-21 DIAGNOSIS — N182 Chronic kidney disease, stage 2 (mild): Secondary | ICD-10-CM | POA: Diagnosis not present

## 2019-02-21 DIAGNOSIS — L89152 Pressure ulcer of sacral region, stage 2: Secondary | ICD-10-CM | POA: Diagnosis not present

## 2019-02-21 DIAGNOSIS — K219 Gastro-esophageal reflux disease without esophagitis: Secondary | ICD-10-CM | POA: Diagnosis not present

## 2019-02-21 DIAGNOSIS — K922 Gastrointestinal hemorrhage, unspecified: Secondary | ICD-10-CM | POA: Diagnosis not present

## 2019-02-25 DIAGNOSIS — E039 Hypothyroidism, unspecified: Secondary | ICD-10-CM | POA: Diagnosis not present

## 2019-02-25 DIAGNOSIS — K219 Gastro-esophageal reflux disease without esophagitis: Secondary | ICD-10-CM | POA: Diagnosis not present

## 2019-02-25 DIAGNOSIS — L89152 Pressure ulcer of sacral region, stage 2: Secondary | ICD-10-CM | POA: Diagnosis not present

## 2019-02-25 DIAGNOSIS — I714 Abdominal aortic aneurysm, without rupture: Secondary | ICD-10-CM | POA: Diagnosis not present

## 2019-02-25 DIAGNOSIS — E538 Deficiency of other specified B group vitamins: Secondary | ICD-10-CM | POA: Diagnosis not present

## 2019-02-25 DIAGNOSIS — I129 Hypertensive chronic kidney disease with stage 1 through stage 4 chronic kidney disease, or unspecified chronic kidney disease: Secondary | ICD-10-CM | POA: Diagnosis not present

## 2019-02-25 DIAGNOSIS — N182 Chronic kidney disease, stage 2 (mild): Secondary | ICD-10-CM | POA: Diagnosis not present

## 2019-02-25 DIAGNOSIS — G629 Polyneuropathy, unspecified: Secondary | ICD-10-CM | POA: Diagnosis not present

## 2019-02-25 DIAGNOSIS — R69 Illness, unspecified: Secondary | ICD-10-CM | POA: Diagnosis not present

## 2019-02-25 DIAGNOSIS — K922 Gastrointestinal hemorrhage, unspecified: Secondary | ICD-10-CM | POA: Diagnosis not present

## 2019-02-26 DIAGNOSIS — R600 Localized edema: Secondary | ICD-10-CM | POA: Diagnosis not present

## 2019-02-26 DIAGNOSIS — I129 Hypertensive chronic kidney disease with stage 1 through stage 4 chronic kidney disease, or unspecified chronic kidney disease: Secondary | ICD-10-CM | POA: Diagnosis not present

## 2019-02-26 DIAGNOSIS — J810 Acute pulmonary edema: Secondary | ICD-10-CM | POA: Diagnosis not present

## 2019-02-26 DIAGNOSIS — K5901 Slow transit constipation: Secondary | ICD-10-CM | POA: Diagnosis not present

## 2019-02-26 DIAGNOSIS — R0789 Other chest pain: Secondary | ICD-10-CM | POA: Diagnosis not present

## 2019-02-26 DIAGNOSIS — J156 Pneumonia due to other aerobic Gram-negative bacteria: Secondary | ICD-10-CM | POA: Diagnosis not present

## 2019-02-26 DIAGNOSIS — Z79899 Other long term (current) drug therapy: Secondary | ICD-10-CM | POA: Diagnosis not present

## 2019-02-26 DIAGNOSIS — R05 Cough: Secondary | ICD-10-CM | POA: Diagnosis not present

## 2019-02-26 DIAGNOSIS — N183 Chronic kidney disease, stage 3 (moderate): Secondary | ICD-10-CM | POA: Diagnosis not present

## 2019-02-28 DIAGNOSIS — K219 Gastro-esophageal reflux disease without esophagitis: Secondary | ICD-10-CM | POA: Diagnosis not present

## 2019-02-28 DIAGNOSIS — I714 Abdominal aortic aneurysm, without rupture: Secondary | ICD-10-CM | POA: Diagnosis not present

## 2019-02-28 DIAGNOSIS — N182 Chronic kidney disease, stage 2 (mild): Secondary | ICD-10-CM | POA: Diagnosis not present

## 2019-02-28 DIAGNOSIS — K922 Gastrointestinal hemorrhage, unspecified: Secondary | ICD-10-CM | POA: Diagnosis not present

## 2019-02-28 DIAGNOSIS — L89152 Pressure ulcer of sacral region, stage 2: Secondary | ICD-10-CM | POA: Diagnosis not present

## 2019-02-28 DIAGNOSIS — G629 Polyneuropathy, unspecified: Secondary | ICD-10-CM | POA: Diagnosis not present

## 2019-02-28 DIAGNOSIS — E538 Deficiency of other specified B group vitamins: Secondary | ICD-10-CM | POA: Diagnosis not present

## 2019-02-28 DIAGNOSIS — R69 Illness, unspecified: Secondary | ICD-10-CM | POA: Diagnosis not present

## 2019-02-28 DIAGNOSIS — E039 Hypothyroidism, unspecified: Secondary | ICD-10-CM | POA: Diagnosis not present

## 2019-02-28 DIAGNOSIS — I129 Hypertensive chronic kidney disease with stage 1 through stage 4 chronic kidney disease, or unspecified chronic kidney disease: Secondary | ICD-10-CM | POA: Diagnosis not present

## 2019-03-04 DIAGNOSIS — E538 Deficiency of other specified B group vitamins: Secondary | ICD-10-CM | POA: Diagnosis not present

## 2019-03-04 DIAGNOSIS — K219 Gastro-esophageal reflux disease without esophagitis: Secondary | ICD-10-CM | POA: Diagnosis not present

## 2019-03-04 DIAGNOSIS — K922 Gastrointestinal hemorrhage, unspecified: Secondary | ICD-10-CM | POA: Diagnosis not present

## 2019-03-04 DIAGNOSIS — E039 Hypothyroidism, unspecified: Secondary | ICD-10-CM | POA: Diagnosis not present

## 2019-03-04 DIAGNOSIS — R69 Illness, unspecified: Secondary | ICD-10-CM | POA: Diagnosis not present

## 2019-03-04 DIAGNOSIS — L89152 Pressure ulcer of sacral region, stage 2: Secondary | ICD-10-CM | POA: Diagnosis not present

## 2019-03-04 DIAGNOSIS — I714 Abdominal aortic aneurysm, without rupture: Secondary | ICD-10-CM | POA: Diagnosis not present

## 2019-03-04 DIAGNOSIS — N182 Chronic kidney disease, stage 2 (mild): Secondary | ICD-10-CM | POA: Diagnosis not present

## 2019-03-04 DIAGNOSIS — G629 Polyneuropathy, unspecified: Secondary | ICD-10-CM | POA: Diagnosis not present

## 2019-03-04 DIAGNOSIS — I129 Hypertensive chronic kidney disease with stage 1 through stage 4 chronic kidney disease, or unspecified chronic kidney disease: Secondary | ICD-10-CM | POA: Diagnosis not present

## 2019-03-05 DIAGNOSIS — W19XXXD Unspecified fall, subsequent encounter: Secondary | ICD-10-CM | POA: Diagnosis not present

## 2019-03-05 DIAGNOSIS — R531 Weakness: Secondary | ICD-10-CM | POA: Diagnosis not present

## 2019-03-05 DIAGNOSIS — Z79899 Other long term (current) drug therapy: Secondary | ICD-10-CM | POA: Diagnosis not present

## 2019-03-05 DIAGNOSIS — R69 Illness, unspecified: Secondary | ICD-10-CM | POA: Diagnosis not present

## 2019-03-05 DIAGNOSIS — I10 Essential (primary) hypertension: Secondary | ICD-10-CM | POA: Diagnosis not present

## 2019-03-07 DIAGNOSIS — G629 Polyneuropathy, unspecified: Secondary | ICD-10-CM | POA: Diagnosis not present

## 2019-03-07 DIAGNOSIS — N182 Chronic kidney disease, stage 2 (mild): Secondary | ICD-10-CM | POA: Diagnosis not present

## 2019-03-07 DIAGNOSIS — R69 Illness, unspecified: Secondary | ICD-10-CM | POA: Diagnosis not present

## 2019-03-07 DIAGNOSIS — K219 Gastro-esophageal reflux disease without esophagitis: Secondary | ICD-10-CM | POA: Diagnosis not present

## 2019-03-07 DIAGNOSIS — I129 Hypertensive chronic kidney disease with stage 1 through stage 4 chronic kidney disease, or unspecified chronic kidney disease: Secondary | ICD-10-CM | POA: Diagnosis not present

## 2019-03-07 DIAGNOSIS — L89152 Pressure ulcer of sacral region, stage 2: Secondary | ICD-10-CM | POA: Diagnosis not present

## 2019-03-07 DIAGNOSIS — K922 Gastrointestinal hemorrhage, unspecified: Secondary | ICD-10-CM | POA: Diagnosis not present

## 2019-03-07 DIAGNOSIS — I714 Abdominal aortic aneurysm, without rupture: Secondary | ICD-10-CM | POA: Diagnosis not present

## 2019-03-07 DIAGNOSIS — E039 Hypothyroidism, unspecified: Secondary | ICD-10-CM | POA: Diagnosis not present

## 2019-03-07 DIAGNOSIS — E538 Deficiency of other specified B group vitamins: Secondary | ICD-10-CM | POA: Diagnosis not present

## 2019-03-11 DIAGNOSIS — L89152 Pressure ulcer of sacral region, stage 2: Secondary | ICD-10-CM | POA: Diagnosis not present

## 2019-03-11 DIAGNOSIS — E039 Hypothyroidism, unspecified: Secondary | ICD-10-CM | POA: Diagnosis not present

## 2019-03-11 DIAGNOSIS — I714 Abdominal aortic aneurysm, without rupture: Secondary | ICD-10-CM | POA: Diagnosis not present

## 2019-03-11 DIAGNOSIS — R69 Illness, unspecified: Secondary | ICD-10-CM | POA: Diagnosis not present

## 2019-03-11 DIAGNOSIS — I129 Hypertensive chronic kidney disease with stage 1 through stage 4 chronic kidney disease, or unspecified chronic kidney disease: Secondary | ICD-10-CM | POA: Diagnosis not present

## 2019-03-11 DIAGNOSIS — G629 Polyneuropathy, unspecified: Secondary | ICD-10-CM | POA: Diagnosis not present

## 2019-03-11 DIAGNOSIS — K219 Gastro-esophageal reflux disease without esophagitis: Secondary | ICD-10-CM | POA: Diagnosis not present

## 2019-03-11 DIAGNOSIS — E538 Deficiency of other specified B group vitamins: Secondary | ICD-10-CM | POA: Diagnosis not present

## 2019-03-11 DIAGNOSIS — K922 Gastrointestinal hemorrhage, unspecified: Secondary | ICD-10-CM | POA: Diagnosis not present

## 2019-03-11 DIAGNOSIS — N182 Chronic kidney disease, stage 2 (mild): Secondary | ICD-10-CM | POA: Diagnosis not present

## 2019-03-13 DIAGNOSIS — N182 Chronic kidney disease, stage 2 (mild): Secondary | ICD-10-CM | POA: Diagnosis not present

## 2019-03-13 DIAGNOSIS — R69 Illness, unspecified: Secondary | ICD-10-CM | POA: Diagnosis not present

## 2019-03-13 DIAGNOSIS — K922 Gastrointestinal hemorrhage, unspecified: Secondary | ICD-10-CM | POA: Diagnosis not present

## 2019-03-13 DIAGNOSIS — E538 Deficiency of other specified B group vitamins: Secondary | ICD-10-CM | POA: Diagnosis not present

## 2019-03-13 DIAGNOSIS — I714 Abdominal aortic aneurysm, without rupture: Secondary | ICD-10-CM | POA: Diagnosis not present

## 2019-03-13 DIAGNOSIS — I129 Hypertensive chronic kidney disease with stage 1 through stage 4 chronic kidney disease, or unspecified chronic kidney disease: Secondary | ICD-10-CM | POA: Diagnosis not present

## 2019-03-13 DIAGNOSIS — G629 Polyneuropathy, unspecified: Secondary | ICD-10-CM | POA: Diagnosis not present

## 2019-03-13 DIAGNOSIS — E039 Hypothyroidism, unspecified: Secondary | ICD-10-CM | POA: Diagnosis not present

## 2019-03-13 DIAGNOSIS — K219 Gastro-esophageal reflux disease without esophagitis: Secondary | ICD-10-CM | POA: Diagnosis not present

## 2019-03-13 DIAGNOSIS — L89152 Pressure ulcer of sacral region, stage 2: Secondary | ICD-10-CM | POA: Diagnosis not present

## 2019-03-18 DIAGNOSIS — K219 Gastro-esophageal reflux disease without esophagitis: Secondary | ICD-10-CM | POA: Diagnosis not present

## 2019-03-18 DIAGNOSIS — N182 Chronic kidney disease, stage 2 (mild): Secondary | ICD-10-CM | POA: Diagnosis not present

## 2019-03-18 DIAGNOSIS — I129 Hypertensive chronic kidney disease with stage 1 through stage 4 chronic kidney disease, or unspecified chronic kidney disease: Secondary | ICD-10-CM | POA: Diagnosis not present

## 2019-03-18 DIAGNOSIS — K922 Gastrointestinal hemorrhage, unspecified: Secondary | ICD-10-CM | POA: Diagnosis not present

## 2019-03-18 DIAGNOSIS — E039 Hypothyroidism, unspecified: Secondary | ICD-10-CM | POA: Diagnosis not present

## 2019-03-18 DIAGNOSIS — G629 Polyneuropathy, unspecified: Secondary | ICD-10-CM | POA: Diagnosis not present

## 2019-03-18 DIAGNOSIS — I714 Abdominal aortic aneurysm, without rupture: Secondary | ICD-10-CM | POA: Diagnosis not present

## 2019-03-18 DIAGNOSIS — E538 Deficiency of other specified B group vitamins: Secondary | ICD-10-CM | POA: Diagnosis not present

## 2019-03-18 DIAGNOSIS — R69 Illness, unspecified: Secondary | ICD-10-CM | POA: Diagnosis not present

## 2019-03-18 DIAGNOSIS — L89152 Pressure ulcer of sacral region, stage 2: Secondary | ICD-10-CM | POA: Diagnosis not present

## 2019-03-19 DIAGNOSIS — R600 Localized edema: Secondary | ICD-10-CM | POA: Diagnosis not present

## 2019-03-19 DIAGNOSIS — Z79899 Other long term (current) drug therapy: Secondary | ICD-10-CM | POA: Diagnosis not present

## 2019-03-19 DIAGNOSIS — I10 Essential (primary) hypertension: Secondary | ICD-10-CM | POA: Diagnosis not present

## 2019-03-19 DIAGNOSIS — N183 Chronic kidney disease, stage 3 (moderate): Secondary | ICD-10-CM | POA: Diagnosis not present

## 2019-03-19 DIAGNOSIS — Z7409 Other reduced mobility: Secondary | ICD-10-CM | POA: Diagnosis not present

## 2019-03-26 DIAGNOSIS — Z20828 Contact with and (suspected) exposure to other viral communicable diseases: Secondary | ICD-10-CM | POA: Diagnosis not present

## 2019-03-26 DIAGNOSIS — R062 Wheezing: Secondary | ICD-10-CM | POA: Diagnosis not present

## 2019-03-26 DIAGNOSIS — R05 Cough: Secondary | ICD-10-CM | POA: Diagnosis not present

## 2019-04-01 DIAGNOSIS — I739 Peripheral vascular disease, unspecified: Secondary | ICD-10-CM | POA: Diagnosis not present

## 2019-04-01 DIAGNOSIS — B351 Tinea unguium: Secondary | ICD-10-CM | POA: Diagnosis not present

## 2019-04-01 DIAGNOSIS — R609 Edema, unspecified: Secondary | ICD-10-CM | POA: Diagnosis not present

## 2019-04-02 DIAGNOSIS — J22 Unspecified acute lower respiratory infection: Secondary | ICD-10-CM | POA: Diagnosis not present

## 2019-04-02 DIAGNOSIS — R600 Localized edema: Secondary | ICD-10-CM | POA: Diagnosis not present

## 2019-04-02 DIAGNOSIS — R05 Cough: Secondary | ICD-10-CM | POA: Diagnosis not present

## 2019-04-02 DIAGNOSIS — U071 COVID-19: Secondary | ICD-10-CM | POA: Diagnosis not present

## 2019-04-02 DIAGNOSIS — Z79899 Other long term (current) drug therapy: Secondary | ICD-10-CM | POA: Diagnosis not present

## 2019-04-02 DIAGNOSIS — R062 Wheezing: Secondary | ICD-10-CM | POA: Diagnosis not present

## 2019-04-09 DIAGNOSIS — R05 Cough: Secondary | ICD-10-CM | POA: Diagnosis not present

## 2019-04-09 DIAGNOSIS — J22 Unspecified acute lower respiratory infection: Secondary | ICD-10-CM | POA: Diagnosis not present

## 2019-04-16 DIAGNOSIS — Z7409 Other reduced mobility: Secondary | ICD-10-CM | POA: Diagnosis not present

## 2019-04-16 DIAGNOSIS — R69 Illness, unspecified: Secondary | ICD-10-CM | POA: Diagnosis not present

## 2019-04-16 DIAGNOSIS — K6289 Other specified diseases of anus and rectum: Secondary | ICD-10-CM | POA: Diagnosis not present

## 2019-04-16 DIAGNOSIS — Z79899 Other long term (current) drug therapy: Secondary | ICD-10-CM | POA: Diagnosis not present

## 2019-04-19 DIAGNOSIS — I129 Hypertensive chronic kidney disease with stage 1 through stage 4 chronic kidney disease, or unspecified chronic kidney disease: Secondary | ICD-10-CM | POA: Diagnosis not present

## 2019-04-19 DIAGNOSIS — N182 Chronic kidney disease, stage 2 (mild): Secondary | ICD-10-CM | POA: Diagnosis not present

## 2019-04-19 DIAGNOSIS — L89312 Pressure ulcer of right buttock, stage 2: Secondary | ICD-10-CM | POA: Diagnosis not present

## 2019-04-23 DIAGNOSIS — Z79899 Other long term (current) drug therapy: Secondary | ICD-10-CM | POA: Diagnosis not present

## 2019-04-23 DIAGNOSIS — L290 Pruritus ani: Secondary | ICD-10-CM | POA: Diagnosis not present

## 2019-04-23 DIAGNOSIS — K644 Residual hemorrhoidal skin tags: Secondary | ICD-10-CM | POA: Diagnosis not present

## 2019-04-23 DIAGNOSIS — Z713 Dietary counseling and surveillance: Secondary | ICD-10-CM | POA: Diagnosis not present

## 2019-04-30 DIAGNOSIS — R269 Unspecified abnormalities of gait and mobility: Secondary | ICD-10-CM | POA: Diagnosis not present

## 2019-04-30 DIAGNOSIS — K644 Residual hemorrhoidal skin tags: Secondary | ICD-10-CM | POA: Diagnosis not present

## 2019-04-30 DIAGNOSIS — R69 Illness, unspecified: Secondary | ICD-10-CM | POA: Diagnosis not present

## 2019-04-30 DIAGNOSIS — Z79899 Other long term (current) drug therapy: Secondary | ICD-10-CM | POA: Diagnosis not present

## 2019-04-30 DIAGNOSIS — K6289 Other specified diseases of anus and rectum: Secondary | ICD-10-CM | POA: Diagnosis not present

## 2019-05-14 DIAGNOSIS — I10 Essential (primary) hypertension: Secondary | ICD-10-CM | POA: Diagnosis not present

## 2019-05-14 DIAGNOSIS — N183 Chronic kidney disease, stage 3 (moderate): Secondary | ICD-10-CM | POA: Diagnosis not present

## 2019-05-14 DIAGNOSIS — Z79899 Other long term (current) drug therapy: Secondary | ICD-10-CM | POA: Diagnosis not present

## 2019-05-28 ENCOUNTER — Other Ambulatory Visit: Payer: Self-pay

## 2019-05-28 ENCOUNTER — Emergency Department (HOSPITAL_COMMUNITY): Payer: Medicare HMO

## 2019-05-28 ENCOUNTER — Inpatient Hospital Stay (HOSPITAL_COMMUNITY)
Admission: EM | Admit: 2019-05-28 | Discharge: 2019-06-02 | DRG: 193 | Disposition: A | Discharge status: Skilled Nursing Facility | Payer: Medicare HMO | Source: Skilled Nursing Facility | Attending: Internal Medicine | Admitting: Internal Medicine

## 2019-05-28 ENCOUNTER — Observation Stay (HOSPITAL_COMMUNITY): Payer: Medicare HMO

## 2019-05-28 DIAGNOSIS — Z66 Do not resuscitate: Secondary | ICD-10-CM | POA: Diagnosis present

## 2019-05-28 DIAGNOSIS — E039 Hypothyroidism, unspecified: Secondary | ICD-10-CM | POA: Diagnosis present

## 2019-05-28 DIAGNOSIS — H5462 Unqualified visual loss, left eye, normal vision right eye: Secondary | ICD-10-CM | POA: Diagnosis present

## 2019-05-28 DIAGNOSIS — Z7982 Long term (current) use of aspirin: Secondary | ICD-10-CM

## 2019-05-28 DIAGNOSIS — H409 Unspecified glaucoma: Secondary | ICD-10-CM | POA: Diagnosis present

## 2019-05-28 DIAGNOSIS — Z882 Allergy status to sulfonamides status: Secondary | ICD-10-CM

## 2019-05-28 DIAGNOSIS — Z8249 Family history of ischemic heart disease and other diseases of the circulatory system: Secondary | ICD-10-CM

## 2019-05-28 DIAGNOSIS — I714 Abdominal aortic aneurysm, without rupture: Secondary | ICD-10-CM | POA: Diagnosis present

## 2019-05-28 DIAGNOSIS — Z90710 Acquired absence of both cervix and uterus: Secondary | ICD-10-CM

## 2019-05-28 DIAGNOSIS — Z888 Allergy status to other drugs, medicaments and biological substances status: Secondary | ICD-10-CM

## 2019-05-28 DIAGNOSIS — I10 Essential (primary) hypertension: Secondary | ICD-10-CM | POA: Diagnosis not present

## 2019-05-28 DIAGNOSIS — R0602 Shortness of breath: Secondary | ICD-10-CM | POA: Diagnosis not present

## 2019-05-28 DIAGNOSIS — Z20828 Contact with and (suspected) exposure to other viral communicable diseases: Secondary | ICD-10-CM | POA: Diagnosis present

## 2019-05-28 DIAGNOSIS — F0390 Unspecified dementia without behavioral disturbance: Secondary | ICD-10-CM | POA: Diagnosis present

## 2019-05-28 DIAGNOSIS — R269 Unspecified abnormalities of gait and mobility: Secondary | ICD-10-CM | POA: Diagnosis present

## 2019-05-28 DIAGNOSIS — Z79899 Other long term (current) drug therapy: Secondary | ICD-10-CM

## 2019-05-28 DIAGNOSIS — Z8673 Personal history of transient ischemic attack (TIA), and cerebral infarction without residual deficits: Secondary | ICD-10-CM | POA: Diagnosis not present

## 2019-05-28 DIAGNOSIS — Z8719 Personal history of other diseases of the digestive system: Secondary | ICD-10-CM

## 2019-05-28 DIAGNOSIS — I110 Hypertensive heart disease with heart failure: Secondary | ICD-10-CM | POA: Diagnosis present

## 2019-05-28 DIAGNOSIS — F418 Other specified anxiety disorders: Secondary | ICD-10-CM | POA: Diagnosis present

## 2019-05-28 DIAGNOSIS — R69 Illness, unspecified: Secondary | ICD-10-CM | POA: Diagnosis not present

## 2019-05-28 DIAGNOSIS — K5909 Other constipation: Secondary | ICD-10-CM | POA: Diagnosis present

## 2019-05-28 DIAGNOSIS — E7800 Pure hypercholesterolemia, unspecified: Secondary | ICD-10-CM | POA: Diagnosis present

## 2019-05-28 DIAGNOSIS — N182 Chronic kidney disease, stage 2 (mild): Secondary | ICD-10-CM | POA: Diagnosis present

## 2019-05-28 DIAGNOSIS — I452 Bifascicular block: Secondary | ICD-10-CM | POA: Diagnosis present

## 2019-05-28 DIAGNOSIS — E876 Hypokalemia: Secondary | ICD-10-CM | POA: Diagnosis present

## 2019-05-28 DIAGNOSIS — J189 Pneumonia, unspecified organism: Secondary | ICD-10-CM | POA: Diagnosis not present

## 2019-05-28 DIAGNOSIS — E870 Hyperosmolality and hypernatremia: Secondary | ICD-10-CM | POA: Diagnosis present

## 2019-05-28 DIAGNOSIS — F0150 Vascular dementia without behavioral disturbance: Secondary | ICD-10-CM

## 2019-05-28 DIAGNOSIS — J9601 Acute respiratory failure with hypoxia: Secondary | ICD-10-CM | POA: Diagnosis present

## 2019-05-28 DIAGNOSIS — Z87891 Personal history of nicotine dependence: Secondary | ICD-10-CM

## 2019-05-28 DIAGNOSIS — K219 Gastro-esophageal reflux disease without esophagitis: Secondary | ICD-10-CM | POA: Diagnosis present

## 2019-05-28 DIAGNOSIS — Z9181 History of falling: Secondary | ICD-10-CM

## 2019-05-28 DIAGNOSIS — Z803 Family history of malignant neoplasm of breast: Secondary | ICD-10-CM

## 2019-05-28 DIAGNOSIS — Z7951 Long term (current) use of inhaled steroids: Secondary | ICD-10-CM

## 2019-05-28 DIAGNOSIS — Z79890 Hormone replacement therapy (postmenopausal): Secondary | ICD-10-CM

## 2019-05-28 DIAGNOSIS — Z96641 Presence of right artificial hip joint: Secondary | ICD-10-CM | POA: Diagnosis present

## 2019-05-28 DIAGNOSIS — I5033 Acute on chronic diastolic (congestive) heart failure: Secondary | ICD-10-CM | POA: Diagnosis present

## 2019-05-28 DIAGNOSIS — E538 Deficiency of other specified B group vitamins: Secondary | ICD-10-CM | POA: Diagnosis present

## 2019-05-28 DIAGNOSIS — M810 Age-related osteoporosis without current pathological fracture: Secondary | ICD-10-CM | POA: Diagnosis present

## 2019-05-28 LAB — CBC WITH DIFFERENTIAL/PLATELET
Abs Immature Granulocytes: 0.03 10*3/uL (ref 0.00–0.07)
Basophils Absolute: 0 10*3/uL (ref 0.0–0.1)
Basophils Relative: 1 %
Eosinophils Absolute: 0.1 10*3/uL (ref 0.0–0.5)
Eosinophils Relative: 1 %
HCT: 37.1 % (ref 36.0–46.0)
Hemoglobin: 11.4 g/dL — ABNORMAL LOW (ref 12.0–15.0)
Immature Granulocytes: 0 %
Lymphocytes Relative: 8 %
Lymphs Abs: 0.6 10*3/uL — ABNORMAL LOW (ref 0.7–4.0)
MCH: 29.9 pg (ref 26.0–34.0)
MCHC: 30.7 g/dL (ref 30.0–36.0)
MCV: 97.4 fL (ref 80.0–100.0)
Monocytes Absolute: 0.7 10*3/uL (ref 0.1–1.0)
Monocytes Relative: 9 %
Neutro Abs: 6.1 10*3/uL (ref 1.7–7.7)
Neutrophils Relative %: 81 %
Platelets: 232 10*3/uL (ref 150–400)
RBC: 3.81 MIL/uL — ABNORMAL LOW (ref 3.87–5.11)
RDW: 14.6 % (ref 11.5–15.5)
WBC: 7.5 10*3/uL (ref 4.0–10.5)
nRBC: 0 % (ref 0.0–0.2)

## 2019-05-28 LAB — COMPREHENSIVE METABOLIC PANEL
ALT: 10 U/L (ref 0–44)
AST: 15 U/L (ref 15–41)
Albumin: 3.2 g/dL — ABNORMAL LOW (ref 3.5–5.0)
Alkaline Phosphatase: 75 U/L (ref 38–126)
Anion gap: 9 (ref 5–15)
BUN: 24 mg/dL — ABNORMAL HIGH (ref 8–23)
CO2: 29 mmol/L (ref 22–32)
Calcium: 9.2 mg/dL (ref 8.9–10.3)
Chloride: 108 mmol/L (ref 98–111)
Creatinine, Ser: 0.67 mg/dL (ref 0.44–1.00)
GFR calc Af Amer: >60 mL/min (ref >60)
GFR calc non Af Amer: >60 mL/min (ref >60)
Glucose, Bld: 97 mg/dL (ref 70–99)
Potassium: 3.4 mmol/L — ABNORMAL LOW (ref 3.5–5.1)
Sodium: 146 mmol/L — ABNORMAL HIGH (ref 135–145)
Total Bilirubin: 0.5 mg/dL (ref 0.3–1.2)
Total Protein: 6.3 g/dL — ABNORMAL LOW (ref 6.5–8.1)

## 2019-05-28 LAB — URINALYSIS, ROUTINE W REFLEX MICROSCOPIC
Bilirubin Urine: NEGATIVE
Glucose, UA: NEGATIVE mg/dL
Hgb urine dipstick: NEGATIVE
Ketones, ur: NEGATIVE mg/dL
Leukocytes,Ua: NEGATIVE
Nitrite: NEGATIVE
Protein, ur: NEGATIVE mg/dL
Specific Gravity, Urine: 1.014 (ref 1.005–1.030)
pH: 7 (ref 5.0–8.0)

## 2019-05-28 LAB — SARS CORONAVIRUS 2 BY RT PCR (HOSPITAL ORDER, PERFORMED IN ~~LOC~~ HOSPITAL LAB): SARS Coronavirus 2: NEGATIVE

## 2019-05-28 LAB — BRAIN NATRIURETIC PEPTIDE: B Natriuretic Peptide: 217.3 pg/mL — ABNORMAL HIGH (ref 0.0–100.0)

## 2019-05-28 LAB — D-DIMER, QUANTITATIVE: D-Dimer, Quant: 8.62 ug/mL-FEU — ABNORMAL HIGH (ref 0.00–0.50)

## 2019-05-28 LAB — MRSA PCR SCREENING: MRSA by PCR: NEGATIVE

## 2019-05-28 LAB — STREP PNEUMONIAE URINARY ANTIGEN: Strep Pneumo Urinary Antigen: NEGATIVE

## 2019-05-28 MED ORDER — SENNOSIDES-DOCUSATE SODIUM 8.6-50 MG PO TABS
1.0000 | ORAL_TABLET | Freq: Every day | ORAL | Status: DC
  Administered 2019-05-28 – 2019-05-31 (×4): 1 via ORAL
  Filled 2019-05-28 (×5): qty 1

## 2019-05-28 MED ORDER — POTASSIUM CHLORIDE CRYS ER 20 MEQ PO TBCR
40.0000 meq | EXTENDED_RELEASE_TABLET | Freq: Once | ORAL | Status: AC
  Administered 2019-05-28: 40 meq via ORAL
  Filled 2019-05-28: qty 2

## 2019-05-28 MED ORDER — HEPARIN SODIUM (PORCINE) 5000 UNIT/ML IJ SOLN
5000.0000 [IU] | Freq: Three times a day (TID) | INTRAMUSCULAR | Status: DC
  Administered 2019-05-28 – 2019-06-02 (×14): 5000 [IU] via SUBCUTANEOUS
  Filled 2019-05-28 (×14): qty 1

## 2019-05-28 MED ORDER — SODIUM CHLORIDE 0.9 % IV SOLN
1.0000 g | INTRAVENOUS | Status: DC
  Administered 2019-05-29 – 2019-06-02 (×5): 1 g via INTRAVENOUS
  Filled 2019-05-28 (×5): qty 10

## 2019-05-28 MED ORDER — HYDRALAZINE HCL 20 MG/ML IJ SOLN
5.0000 mg | Freq: Four times a day (QID) | INTRAMUSCULAR | Status: DC | PRN
  Administered 2019-05-31: 5 mg via INTRAVENOUS
  Filled 2019-05-28: qty 1

## 2019-05-28 MED ORDER — NETARSUDIL DIMESYLATE 0.02 % OP SOLN: 1.0000 [drp] | Freq: Every day | OPHTHALMIC | Status: DC

## 2019-05-28 MED ORDER — ACETAMINOPHEN 500 MG PO TABS
500.0000 mg | ORAL_TABLET | Freq: Four times a day (QID) | ORAL | Status: DC | PRN
  Administered 2019-06-02: 500 mg via ORAL
  Filled 2019-05-28: qty 1

## 2019-05-28 MED ORDER — HYDRALAZINE HCL 10 MG PO TABS
10.0000 mg | ORAL_TABLET | Freq: Three times a day (TID) | ORAL | Status: DC
  Administered 2019-05-29 – 2019-05-31 (×7): 10 mg via ORAL
  Filled 2019-05-28 (×7): qty 1

## 2019-05-28 MED ORDER — POTASSIUM CHLORIDE 20 MEQ/15ML (10%) PO SOLN
20.0000 meq | ORAL | Status: DC
  Administered 2019-05-28 – 2019-06-02 (×3): 20 meq via ORAL
  Filled 2019-05-28 (×4): qty 15

## 2019-05-28 MED ORDER — ADULT MULTIVITAMIN W/MINERALS CH
1.0000 | ORAL_TABLET | Freq: Every day | ORAL | Status: DC
  Administered 2019-05-28 – 2019-06-02 (×5): 1 via ORAL
  Filled 2019-05-28 (×6): qty 1

## 2019-05-28 MED ORDER — MAGNESIUM OXIDE 400 (241.3 MG) MG PO TABS
400.0000 mg | ORAL_TABLET | Freq: Every day | ORAL | Status: DC
  Administered 2019-05-28 – 2019-06-02 (×5): 400 mg via ORAL
  Filled 2019-05-28 (×6): qty 1

## 2019-05-28 MED ORDER — MIRTAZAPINE 15 MG PO TABS
7.5000 mg | ORAL_TABLET | Freq: Every day | ORAL | Status: DC
  Administered 2019-05-28 – 2019-06-02 (×5): 7.5 mg via ORAL
  Filled 2019-05-28 (×5): qty 1

## 2019-05-28 MED ORDER — DOXYCYCLINE HYCLATE 100 MG PO TABS
100.0000 mg | ORAL_TABLET | Freq: Two times a day (BID) | ORAL | Status: DC
  Administered 2019-05-29 – 2019-06-02 (×9): 100 mg via ORAL
  Filled 2019-05-28 (×9): qty 1

## 2019-05-28 MED ORDER — SERTRALINE HCL 50 MG PO TABS: 50.0000 mg | ORAL_TABLET | Freq: Every day | ORAL | Status: DC

## 2019-05-28 MED ORDER — POLYETHYLENE GLYCOL 3350 17 G PO PACK
17.0000 g | PACK | Freq: Every day | ORAL | Status: DC | PRN
  Administered 2019-06-02: 17 g via ORAL
  Filled 2019-05-28: qty 1

## 2019-05-28 MED ORDER — OLANZAPINE 5 MG PO TABS
7.5000 mg | ORAL_TABLET | Freq: Every day | ORAL | Status: DC
  Administered 2019-05-28 – 2019-06-01 (×5): 7.5 mg via ORAL
  Filled 2019-05-28 (×5): qty 2

## 2019-05-28 MED ORDER — IOHEXOL 350 MG/ML SOLN
75.0000 mL | Freq: Once | INTRAVENOUS | Status: AC | PRN
  Administered 2019-05-28: 75 mL via INTRAVENOUS

## 2019-05-28 MED ORDER — BISACODYL 10 MG RE SUPP: 10.0000 mg | RECTAL | Status: DC

## 2019-05-28 MED ORDER — DORZOLAMIDE HCL-TIMOLOL MAL 2-0.5 % OP SOLN
1.0000 [drp] | Freq: Two times a day (BID) | OPHTHALMIC | Status: DC
  Administered 2019-05-28 – 2019-06-02 (×8): 1 [drp] via OPHTHALMIC
  Filled 2019-05-28 (×2): qty 10

## 2019-05-28 MED ORDER — NITROGLYCERIN 0.4 MG SL SUBL: 0.4000 mg | SUBLINGUAL_TABLET | SUBLINGUAL | Status: DC | PRN

## 2019-05-28 MED ORDER — MEMANTINE HCL 10 MG PO TABS
5.0000 mg | ORAL_TABLET | Freq: Two times a day (BID) | ORAL | Status: DC
  Administered 2019-05-28 – 2019-06-02 (×10): 5 mg via ORAL
  Filled 2019-05-28 (×10): qty 1

## 2019-05-28 MED ORDER — FLUTICASONE PROPIONATE 50 MCG/ACT NA SUSP
1.0000 | Freq: Every day | NASAL | Status: DC
  Administered 2019-05-28 – 2019-06-02 (×5): 1 via NASAL
  Filled 2019-05-28 (×2): qty 16

## 2019-05-28 MED ORDER — PANTOPRAZOLE SODIUM 40 MG PO TBEC
40.0000 mg | DELAYED_RELEASE_TABLET | Freq: Every day | ORAL | Status: DC
  Administered 2019-05-28 – 2019-06-02 (×5): 40 mg via ORAL
  Filled 2019-05-28 (×6): qty 1

## 2019-05-28 MED ORDER — LEVOTHYROXINE SODIUM 50 MCG PO TABS
50.0000 ug | ORAL_TABLET | Freq: Every day | ORAL | Status: DC
  Administered 2019-05-29 – 2019-06-02 (×5): 50 ug via ORAL
  Filled 2019-05-28 (×5): qty 1

## 2019-05-28 MED ORDER — SODIUM CHLORIDE 0.9 % IV SOLN
1.0000 g | Freq: Once | INTRAVENOUS | Status: AC
  Administered 2019-05-28: 1 g via INTRAVENOUS
  Filled 2019-05-28: qty 10

## 2019-05-28 MED ORDER — BUSPIRONE HCL 5 MG PO TABS
2.5000 mg | ORAL_TABLET | Freq: Two times a day (BID) | ORAL | Status: DC
  Administered 2019-05-28 – 2019-06-02 (×10): 2.5 mg via ORAL
  Filled 2019-05-28 (×10): qty 1

## 2019-05-28 MED ORDER — ENSURE ENLIVE PO LIQD
237.0000 mL | Freq: Two times a day (BID) | ORAL | Status: DC
  Administered 2019-05-28 – 2019-06-02 (×10): 237 mL via ORAL

## 2019-05-28 MED ORDER — LABETALOL HCL 100 MG PO TABS
100.0000 mg | ORAL_TABLET | Freq: Two times a day (BID) | ORAL | Status: DC
  Administered 2019-05-28 – 2019-06-02 (×9): 100 mg via ORAL
  Filled 2019-05-28 (×11): qty 1

## 2019-05-28 MED ORDER — SODIUM CHLORIDE 0.9 % IV SOLN
100.0000 mg | Freq: Once | INTRAVENOUS | Status: AC
  Administered 2019-05-28: 100 mg via INTRAVENOUS
  Filled 2019-05-28: qty 100

## 2019-05-28 MED ORDER — LOSARTAN POTASSIUM 50 MG PO TABS
100.0000 mg | ORAL_TABLET | Freq: Every day | ORAL | Status: DC
  Administered 2019-05-28 – 2019-06-01 (×5): 100 mg via ORAL
  Filled 2019-05-28 (×5): qty 2

## 2019-05-28 MED ORDER — CALCIUM CARBONATE ANTACID 500 MG PO CHEW
1000.0000 mg | CHEWABLE_TABLET | Freq: Every day | ORAL | Status: DC
  Administered 2019-05-28 – 2019-06-01 (×5): 1000 mg via ORAL
  Filled 2019-05-28 (×5): qty 5

## 2019-05-28 MED ORDER — VITAMIN B-12 1000 MCG PO TABS
1000.0000 ug | ORAL_TABLET | Freq: Every day | ORAL | Status: DC
  Administered 2019-05-28 – 2019-06-02 (×5): 1000 ug via ORAL
  Filled 2019-05-28 (×6): qty 1

## 2019-05-28 MED ORDER — ASPIRIN EC 81 MG PO TBEC
81.0000 mg | DELAYED_RELEASE_TABLET | Freq: Every day | ORAL | Status: DC
  Administered 2019-05-28 – 2019-06-02 (×6): 81 mg via ORAL
  Filled 2019-05-28 (×7): qty 1

## 2019-05-28 MED ORDER — FUROSEMIDE 10 MG/ML IJ SOLN
40.0000 mg | Freq: Once | INTRAMUSCULAR | Status: AC
  Administered 2019-05-28: 40 mg via INTRAVENOUS
  Filled 2019-05-28: qty 4

## 2019-05-28 NOTE — ED Notes (Signed)
ED TO INPATIENT HANDOFF REPORT  ED Nurse Name and Phone #: Judson Roch [phone]  S Name/Age/Gender Margaret Li 85 y.o. female Room/Bed: 025C/025C  Code Status   Code Status: DNR  Home/SNF/Other Nursing Home Patient oriented to: self, place, time and situation Is this baseline? Yes   Triage Complete: Triage complete  Chief Complaint Low oxygen  Triage Note Pt BIB [facility] for shortness of breath that started yesterday. Pt coming from [facility]. Per EMS patient was 88% on room air and 100% on 4L via [place]. Hypertensive for EMS at 180/120, no HTN medications taken this morning per EMS. Bilateral lower extremity swelling present upon ED arrival. 90% on room air.    Allergies Allergies  Allergen Reactions  . Trazodone And Nefazodone Anaphylaxis  . Clarithromycin     GI side effects Other reaction(s): Other GI side effects  . Codeine Other (See Comments)    GI side effects Other reaction(s): Other GI side effects  . Duloxetine Hcl     GI side effects Other reaction(s): Other GI side effects  . Escitalopram Oxalate Nausea Only and Other (See Comments)    dizziness Other reaction(s): Dizziness  . Fluoxetine     Other reaction(s): Other Abnormal weight loss; GI side effects  . Ibandronic Acid     Groin pain Other reaction(s): Other Groin pain  . Sulfa Antibiotics Other (See Comments)    Unknown reaction  Other reaction(s): Unknown  . Norvasc [Amlodipine Besylate] Other (See Comments)    Abdominal pain, bad dreams  . Prozac [Fluoxetine Hcl] Other (See Comments)    Abnormal weight loss, GI upset   . Trazodone Hcl Swelling and Rash    Swollen tongue     Level of Care/Admitting Diagnosis ED Disposition    ED Disposition Condition [facility] Area: [place] [[id number]]  Level of Care: Med-Surg [16]  I expect the patient will be discharged within 24 hours: Yes  LOW acuity---Tx typically complete <24 hrs---ACUTE conditions typically can  be evaluated <24 hours---LABS likely to return to acceptable levels <24 hours---IS near functional baseline---EXPECTED to return to current living arrangement---NOT newly hypoxic: Meets criteria for 5C-Observation unit  Covid Evaluation: Confirmed COVID Negative  Diagnosis: CAP (community acquired pneumonia) [170017]  Admitting Physician: Manfred Shirts  Attending Physician: Waldron Labs, DAWOOD S [[id number]  PT Class (Do Not Modify): Observation [104]  PT Acc Code (Do Not Modify): Observation [10022]       B Medical/Surgery History Past Medical History:  Diagnosis Date  . AAA (abdominal aortic aneurysm) ([place])    infrarenal, 3.2 cm. seen on CTA Dec 2018  . Anxiety   . Aortic calcification ([facility])   . Black tarry stools    "per patient"  . Cholelithiasis   . Chronic low back pain   . Depression   . Diverticular disease 1999   h/o diverticular block   . GERD (gastroesophageal reflux disease)   . Glaucoma   . H/O hyperthyroidism   . Hypercholesterolemia   . Hypertension   . Hypothyroidism   . Migraine   . Osteoarthritis   . Osteoporosis   . Tubular adenoma    h/o  . Vitamin B 12 deficiency    Past Surgical History:  Procedure Laterality Date  . ABDOMINAL HYSTERECTOMY     80's  . BREAST ENHANCEMENT SURGERY    . CATARACT EXTRACTION, BILATERAL    . ESOPHAGOGASTRODUODENOSCOPY (EGD) WITH PROPOFOL N/A 08/04/2016   Procedure: ESOPHAGOGASTRODUODENOSCOPY (EGD) WITH PROPOFOL;  Surgeon: Carol Ada, MD;  Location: [facility];  Service: Endoscopy;  Laterality: N/A;  . HERNIA REPAIR Right    Inguinal hernia repair '12 -Dr. Rise Patience  . JOINT REPLACEMENT Right    -RTHA -4 '11 - Dr. [name]- [facility]     A IV Location/Drains/Wounds Patient Lines/Drains/Airways Status   Active Line/Drains/Airways    Name:   Placement date:   Placement time:   Site:   Days:   Peripheral IV 05/28/19 Left Forearm   05/28/19    1050    Forearm   less than 1   External Urinary Catheter   01/20/19     0000    -   128   Pressure Injury 11/09/18 Stage I -  Intact skin with non-blanchable redness of a localized area usually over a bony prominence. Pink nonblanchable    11/09/18    2210     200          Intake/Output Last 24 hours  Intake/Output Summary (Last 24 hours) at 05/28/2019 1659 Last data filed at 05/28/2019 1121 Gross per 24 hour  Intake 0.4 ml  Output -  Net 0.4 ml    Labs/Imaging Results for orders placed or performed during the hospital encounter of 05/28/19 (from the past 48 hour(s))  Comprehensive metabolic panel     Status: Abnormal   Collection Time: 05/28/19  9:23 AM  Result Value Ref Range   Sodium 146 (H) 135 - 145 mmol/L   Potassium 3.4 (L) 3.5 - 5.1 mmol/L   Chloride 108 98 - 111 mmol/L   CO2 29 22 - 32 mmol/L   Glucose, Bld 97 70 - 99 mg/dL   BUN 24 (H) 8 - 23 mg/dL   Creatinine, Ser 0.67 0.44 - 1.00 mg/dL   Calcium 9.2 8.9 - 10.3 mg/dL   Total Protein 6.3 (L) 6.5 - 8.1 g/dL   Albumin 3.2 (L) 3.5 - 5.0 g/dL   AST 15 15 - 41 U/L   ALT 10 0 - 44 U/L   Alkaline Phosphatase 75 38 - 126 U/L   Total Bilirubin 0.5 0.3 - 1.2 mg/dL   GFR calc non Af Amer >60 >60 mL/min   GFR calc Af Amer >60 >60 mL/min   Anion gap 9 5 - 15    Comment: Performed at [facility], [address] [place], [place] [address]  CBC with Differential     Status: Abnormal   Collection Time: 05/28/19  9:23 AM  Result Value Ref Range   WBC 7.5 4.0 - 10.5 K/uL   RBC 3.81 (L) 3.87 - 5.11 MIL/uL   Hemoglobin 11.4 (L) 12.0 - 15.0 g/dL   HCT 37.1 36.0 - 46.0 %   MCV 97.4 80.0 - 100.0 fL   MCH 29.9 26.0 - 34.0 pg   MCHC 30.7 30.0 - 36.0 g/dL   RDW 14.6 11.5 - 15.5 %   Platelets 232 150 - 400 K/uL   nRBC 0.0 0.0 - 0.2 %   Neutrophils Relative % 81 %   Neutro Abs 6.1 1.7 - 7.7 K/uL   Lymphocytes Relative 8 %   Lymphs Abs 0.6 (L) 0.7 - 4.0 K/uL   Monocytes Relative 9 %   Monocytes Absolute 0.7 0.1 - 1.0 K/uL   Eosinophils Relative 1 %   Eosinophils Absolute 0.1 0.0 - 0.5  K/uL   Basophils Relative 1 %   Basophils Absolute 0.0 0.0 - 0.1 K/uL   Immature Granulocytes 0 %  Abs Immature Granulocytes 0.03 0.00 - 0.07 K/uL    Comment: Performed at [facility], [place] [address], [place], [place] [address]  Brain natriuretic peptide     Status: Abnormal   Collection Time: 05/28/19  9:24 AM  Result Value Ref Range   B Natriuretic Peptide 217.3 (H) 0.0 - 100.0 pg/mL    Comment: Performed at [facility] [address], [place], [place] [address]  SARS Coronavirus 2 (CEPHEID- Performed in [place] hospital lab), Hosp Order     Status: None   Collection Time: 05/28/19 12:25 PM   Specimen: Nasopharyngeal Swab  Result Value Ref Range   SARS Coronavirus 2 NEGATIVE NEGATIVE    Comment: (NOTE) If result is NEGATIVE SARS-CoV-2 target nucleic acids are NOT DETECTED. The SARS-CoV-2 RNA is generally detectable in upper and lower  respiratory specimens during the acute phase of infection. The lowest  concentration of SARS-CoV-2 viral copies this assay can detect is 250  copies / mL. A negative result does not preclude SARS-CoV-2 infection  and should not be used as the sole basis for treatment or other  patient management decisions.  A negative result may occur with  improper specimen collection / handling, submission of specimen other  than nasopharyngeal swab, presence of viral mutation(s) within the  areas targeted by this assay, and inadequate number of viral copies  (<250 copies / mL). A negative result must be combined with clinical  observations, patient history, and epidemiological information. If result is POSITIVE SARS-CoV-2 target nucleic acids are DETECTED. The SARS-CoV-2 RNA is generally detectable in upper and lower  respiratory specimens dur ing the acute phase of infection.  Positive  results are indicative of active infection with SARS-CoV-2.  Clinical  correlation with patient history and other diagnostic information is  necessary to  determine patient infection status.  Positive results do  not rule out bacterial infection or co-infection with other viruses. If result is PRESUMPTIVE POSTIVE SARS-CoV-2 nucleic acids MAY BE PRESENT.   A presumptive positive result was obtained on the submitted specimen  and confirmed on repeat testing.  While 2019 novel coronavirus  (SARS-CoV-2) nucleic acids may be present in the submitted sample  additional confirmatory testing may be necessary for epidemiological  and / or clinical management purposes  to differentiate between  SARS-CoV-2 and other Sarbecovirus currently known to infect humans.  If clinically indicated additional testing with an alternate test  methodology [phone]) is advised. The SARS-CoV-2 RNA is generally  detectable in upper and lower respiratory sp ecimens during the acute  phase of infection. The expected result is Negative. Fact Sheet for Patients:  StrictlyIdeas.no Fact Sheet for Healthcare Providers: BankingDealers.co.za This test is not yet approved or cleared by the [place] FDA and has been authorized for detection and/or diagnosis of SARS-CoV-2 by FDA under an Emergency Use Authorization (EUA).  This EUA will remain in effect (meaning this test can be used) for the duration of the COVID-19 declaration under Section 564(b)(1) of the Act, 21 U.S.C. section 360bbb-3(b)(1), unless the authorization is terminated or revoked sooner. Performed at [facility], [place] [address] [place],  [address]   D-dimer, quantitative     Status: Abnormal   Collection Time: 05/28/19 12:50 PM  Result Value Ref Range   D-Dimer, Quant 8.62 (H) 0.00 - 0.50 ug/mL-FEU    Comment: (NOTE) At the manufacturer cut-off of 0.50 ug/mL FEU, this assay has been documented to exclude PE with a sensitivity and  negative predictive value of 97 to 99%.  At this time, this assay has not been approved by the FDA to exclude  DVT/VTE. Results should be correlated with clinical presentation. Performed at [facility], [place] [address] [place], [place] [address]   Urinalysis, Routine w reflex microscopic     Status: Abnormal   Collection Time: 05/28/19  1:12 PM  Result Value Ref Range   Color, Urine YELLOW YELLOW   APPearance CLOUDY (A) CLEAR   Specific Gravity, Urine 1.014 1.005 - 1.030   pH 7.0 5.0 - 8.0   Glucose, UA NEGATIVE NEGATIVE mg/dL   Hgb urine dipstick NEGATIVE NEGATIVE   Bilirubin Urine NEGATIVE NEGATIVE   Ketones, ur NEGATIVE NEGATIVE mg/dL   Protein, ur NEGATIVE NEGATIVE mg/dL   Nitrite NEGATIVE NEGATIVE   Leukocytes,Ua NEGATIVE NEGATIVE    Comment: Performed at [facility] [address], [place], [place] [address]  Strep pneumoniae urinary antigen     Status: None   Collection Time: 05/28/19  1:12 PM  Result Value Ref Range   Strep Pneumo Urinary Antigen NEGATIVE NEGATIVE    Comment:        Infection due to S. pneumoniae cannot be absolutely ruled out since the antigen present may be below the detection limit of the test. Performed at [facility], [address], [place], [place] [address]    Ct Angio Chest Pe W/cm &/or Wo Cm  Result Date: 05/28/2019 CLINICAL DATA:  Shortness of breath, D-dimer EXAM: CT ANGIOGRAPHY CHEST WITH CONTRAST TECHNIQUE: Multidetector CT imaging of the chest was performed using the standard protocol during bolus administration of intravenous contrast. Multiplanar CT image reconstructions and MIPs were obtained to evaluate the vascular anatomy. CONTRAST:  64mL OMNIPAQUE IOHEXOL 350 MG/ML SOLN COMPARISON:  10/29/2017 FINDINGS: Cardiovascular: Slightly early phase of contrast, which limits evaluation of the distal segmental and subsegmental vessels. Within this limitation, no evidence of pulmonary embolism. No evidence of pulmonary embolism. Aortic atherosclerosis. Normal heart size. Coronary artery calcifications. No pericardial effusion.  Mediastinum/Nodes: No enlarged mediastinal, hilar, or axillary lymph nodes. Thyroid gland, trachea, and esophagus demonstrate no significant findings. Lungs/Pleura: There is extensive bilateral, clustered ground-glass and heterogeneous pulmonary opacity, as well as masslike consolidation of the right lower and middle lobes. Small bilateral pleural effusions. Upper Abdomen: No acute abnormality. Musculoskeletal: No chest wall abnormality. Bilateral breast implants. No acute or significant osseous findings. Review of the MIP images confirms the above findings. IMPRESSION: 1. Slightly early phase of contrast, which limits evaluation of the distal segmental and subsegmental vessels. Within this limitation, no evidence of pulmonary embolism. 2. There is extensive bilateral, clustered ground-glass and heterogeneous pulmonary opacity, as well as masslike consolidation of the right lower and middle lobes. Small bilateral pleural effusions. Findings most likely reflect extensive multifocal infection. Underlying mass of the right lower lobe is not excluded. Recommend CT or radiographic follow-up to complete resolution in 6-8 weeks. 3.  Coronary artery disease and aortic atherosclerosis. Electronically Signed   By: Eddie Candle M.D.   On: 05/28/2019 15:44   Dg Chest Port 1 View  Result Date: 05/28/2019 CLINICAL DATA:  Shortness of breath. EXAM: PORTABLE CHEST 1 VIEW COMPARISON:  Radiographs of November 30, 2018. FINDINGS: Stable cardiomegaly. Stable enlargement of descending thoracic aorta is noted. No pneumothorax is noted. Left lung is clear. Increased right lower lobe opacity is noted concerning for possible pneumonia. No significant pleural effusion is noted. Bony thorax is unremarkable. IMPRESSION: Findings most consistent with  right lower lobe pneumonia. Followup PA and lateral chest X-ray is recommended in 3-4 weeks following trial of antibiotic therapy to ensure resolution and exclude underlying malignancy.  Electronically Signed   By: Marijo Conception M.D.   On: 05/28/2019 10:08    Pending Labs Unresulted Labs (From admission, onward)    Start     Ordered   05/29/19 4193  Basic metabolic panel  Tomorrow morning,   R     05/28/19 1411   05/29/19 0500  CBC  Tomorrow morning,   R     05/28/19 1411   05/28/19 1419  Legionella Pneumophila Serogp 1 Ur Ag  Add-on,   AD     05/28/19 1419          Vitals/Pain Today's Vitals   05/28/19 1400 05/28/19 1415 05/28/19 1430 05/28/19 1445  BP: 124/79 115/77 126/80 137/86  Pulse: 72 72 71 71  Resp: 18 19 13 15   Temp:      TempSrc:      SpO2: 99% 99% 99% 100%  Weight:      Height:      PainSc:        Isolation Precautions Airborne and Contact precautions  Medications Medications  heparin injection 5,000 Units (has no administration in time range)  potassium chloride SA (K-DUR) CR tablet 40 mEq (has no administration in time range)  cefTRIAXone (ROCEPHIN) 1 g in sodium chloride 0.9 % 100 mL IVPB (0 g Intravenous Stopped 05/28/19 1121)  doxycycline (VIBRAMYCIN) 100 mg in sodium chloride 0.9 % 250 mL IVPB (0 mg Intravenous Stopped 05/28/19 1329)  iohexol (OMNIPAQUE) 350 MG/ML injection 75 mL (75 mLs Intravenous Contrast Given 05/28/19 1517)    Mobility non-ambulatory Moderate fall risk   Focused Assessments Pulmonary Assessment Handoff:  Lung sounds: Bilateral Breath Sounds: Rhonchi L Breath Sounds: Rhonchi R Breath Sounds: Rhonchi O2 Device: Room Air        R Recommendations: See Admitting Provider Note  Report given to:   Additional Notes:

## 2019-05-28 NOTE — ED Notes (Signed)
Patient transported to CT 

## 2019-05-28 NOTE — ED Notes (Signed)
Attempted to call 5N x 4. No answer.

## 2019-05-28 NOTE — ED Notes (Signed)
Pt is 99% on 3L via [place] at present time.

## 2019-05-28 NOTE — ED Triage Notes (Signed)
Pt BIB [facility] for shortness of breath that started yesterday. Pt coming from [facility]. Per EMS patient was 88% on room air and 100% on 4L via [place]. Hypertensive for EMS at 180/120, no HTN medications taken this morning per EMS. Bilateral lower extremity swelling present upon ED arrival. 90% on room air.

## 2019-05-28 NOTE — Progress Notes (Signed)
Pt oriented to room  Pt vitals documented, telemetry verified with CCMD, and assisted pt in ordering dinner  Pt has all belongings in reach, including call light  Will continue to monitor   MD and pharmacist aware of pt allergy/medication warnings

## 2019-05-28 NOTE — ED Provider Notes (Signed)
[facility] Provider Note   CSN: [id number] Arrival date & time: 05/28/19  8315    History   Chief Complaint Chief Complaint  Patient presents with   Shortness of Breath    HPI Margaret Li is a 85 y.o. female.     The history is provided by the patient and medical records. No language interpreter was used.   Margaret Li is a 85 y.o. female who presents to the Emergency Department complaining of who presents to the Emergency Department complaining of shortness of breath. She presents to the emergency department [facility] for [facility] of shortness of breath. Per nursing facility her symptoms began yesterday, per patient they started today. She also complains of generalized weakness and feeling unwell. No known coronavirus exposures. No additional symptoms. Level V caveat due to confusion. EMS reports oxygen saturations of 88% on room air.  POA is Margaret Li - [phone] (home) [phone]. Past Medical History:  Diagnosis Date   AAA (abdominal aortic aneurysm) ([place])    infrarenal, 3.2 cm. seen on CTA Dec 2018   Anxiety    Aortic calcification (HCC)    Black tarry stools    "per patient"   Cholelithiasis    Chronic low back pain    Depression    Diverticular disease 1999   h/o diverticular block    GERD (gastroesophageal reflux disease)    Glaucoma    H/O hyperthyroidism    Hypercholesterolemia    Hypertension    Hypothyroidism    Migraine    Osteoarthritis    Osteoporosis    Tubular adenoma    h/o   Vitamin B 12 deficiency     Patient Active Problem List   Diagnosis Date Noted   CAP (community acquired pneumonia) 05/28/2019   GIB (gastrointestinal bleeding) 01/19/2019   Hypokalemia    Hypomagnesemia    Dysphagia    Pressure injury of skin 11/10/2018   Syncope 11/09/2018   AAA (abdominal aortic aneurysm) ([place]) 11/09/2018   CKD (chronic kidney disease), stage  II 11/09/2018   History of CVA (cerebrovascular accident) 11/09/2018   Orthostasis 11/09/2018   Dementia ([place]) 11/09/2018   Major depressive disorder with psychotic features ([place]) 05/11/2018   Vision loss, left eye 02/17/2017   Visual hallucinations 02/17/2017   Neuropathy 02/17/2017   Hypertension 06/14/2015   Hypothyroidism 06/14/2015   Glaucoma 06/14/2015   Rectal bleeding 06/13/2015    Past Surgical History:  Procedure Laterality Date   ABDOMINAL HYSTERECTOMY     80's   BREAST ENHANCEMENT SURGERY     CATARACT EXTRACTION, BILATERAL     ESOPHAGOGASTRODUODENOSCOPY (EGD) WITH PROPOFOL N/A 08/04/2016   Procedure: ESOPHAGOGASTRODUODENOSCOPY (EGD) WITH PROPOFOL;  Surgeon: Carol Ada, MD;  Location: [facility];  Service: Endoscopy;  Laterality: N/A;   HERNIA REPAIR Right    Inguinal hernia repair '12 -Dr. Rise Patience   JOINT REPLACEMENT Right    -RTHA -4 '11 - Dr. [name]- [facility]     OB History   No obstetric history on file.      Home Medications    Prior to Admission medications   Medication Sig Start Date End Date Taking? Authorizing Provider  acetaminophen (TYLENOL) 500 MG tablet Take 500 mg by mouth every 6 (six) hours as needed for mild pain or headache.   Yes [provider]  antiseptic oral rinse (BIOTENE) LIQD 30 mLs by Mouth Rinse route 2 (two) times daily.   Yes [provider]  aspirin EC 81 MG EC tablet Take 1 tablet (81 mg total) by mouth daily. 11/16/18  Yes Florencia Reasons, MD  bisacodyl (DULCOLAX) 10 MG suppository Place 1 suppository (10 mg total) rectally every Sunday. 11/17/18  Yes Florencia Reasons, MD  busPIRone (BUSPAR) 5 MG tablet Take 2.5 mg by mouth 2 (two) times daily.    Yes [provider]  calcium elemental as carbonate (ANTACID MAXIMUM) 400 MG chewable tablet Chew 1,000 mg by mouth at bedtime.   Yes [provider]  dorzolamide-timolol (COSOPT) 22.3-6.8 MG/ML ophthalmic solution Place 1 drop into both eyes 2  (two) times daily.   Yes [provider]  ENSURE (ENSURE) Take 237 mLs by mouth 2 (two) times daily. Chocolate   Yes [provider]  fluticasone (FLONASE) 50 MCG/ACT nasal spray Place 1 spray into both nostrils daily.   Yes [provider]  furosemide (LASIX) 20 MG tablet Take 20 mg by mouth daily.  05/12/19  Yes [provider]  guaiFENesin (DIABETIC TUSSIN EX) 100 MG/5ML liquid Take 200 mg by mouth every 4 (four) hours as needed for cough or congestion.   Yes [provider]  labetalol (NORMODYNE) 100 MG tablet Take 100 mg by mouth 2 (two) times daily. Hold for SBP <100 or pulse <55   Yes [provider]  levothyroxine (SYNTHROID, LEVOTHROID) 50 MCG tablet Take 50 mcg by mouth daily before breakfast.    Yes [provider]  losartan (COZAAR) 100 MG tablet Take 100 mg by mouth at bedtime.  06/01/15  Yes [provider]  magnesium oxide (MAG-OX) 400 MG tablet Take 1 tablet (400 mg total) by mouth daily. 11/15/18  Yes Florencia Reasons, MD  memantine (NAMENDA) 5 MG tablet Take 5 mg by mouth 2 (two) times daily.    Yes [provider]  mirtazapine (REMERON) 7.5 MG tablet Take 7.5 mg by mouth at bedtime.   Yes [provider]  Multiple Vitamins-Minerals (MULTIVITAMIN WOMEN 50+) TABS Take 1 tablet by mouth daily. GUMMIE   Yes [provider]  Netarsudil Dimesylate 0.02 % SOLN Place 1 drop into both eyes at bedtime. "Rhopressa" 09/07/17  Yes [provider]  nitroGLYCERIN (NITROSTAT) 0.4 MG SL tablet Place 1 tablet (0.4 mg total) under the tongue every 5 (five) minutes as needed for chest pain (q61mins up to three times). 11/15/18  Yes Florencia Reasons, MD  OLANZapine (ZYPREXA) 7.5 MG tablet Take 7.5 mg by mouth at bedtime.   Yes [provider]  pantoprazole (PROTONIX) 40 MG tablet Take 1 tablet (40 mg total) by mouth daily. 11/16/18  Yes Florencia Reasons, MD  polyethylene glycol [facility] Floria Raveling) packet Take 17 g by  mouth 2 (two) times daily. Patient taking differently: Take 17 g by mouth daily as needed for mild constipation or moderate constipation.  11/15/18  Yes Florencia Reasons, MD  potassium chloride 20 MEQ/15ML (10%) SOLN Take 15 mLs by mouth every Monday, Wednesday, and Friday. 05/19/19  Yes [provider]  PROCTOZONE-HC 2.5 % rectal cream Place 1 application rectally as needed. 04/16/19  Yes [provider]  sennosides-docusate sodium (SENOKOT-S) 8.6-50 MG tablet Take 1 tablet by mouth daily.   Yes [provider]  sertraline (ZOLOFT) 50 MG tablet Take 50 mg by mouth daily.   Yes [provider]  Suvorexant (BELSOMRA) 5 MG TABS Take 5 mg by mouth at bedtime.   Yes [provider]  vitamin B-12 (CYANOCOBALAMIN) 1000 MCG tablet Take 1,000 mcg by mouth daily.  Yes [provider]  hydrALAZINE (APRESOLINE) 10 MG tablet Take 1 tablet (10 mg total) by mouth 3 (three) times daily for 30 days. 11/15/18 12/15/18  Florencia Reasons, MD    Family History Family History  Problem Relation Age of Onset   Breast cancer Sister        half sister   Heart disease Brother        half brother    Social History Social History   Tobacco Use   Smoking status: Former Smoker    Types: Cigarettes    Quit date: 08/01/2011    Years since quitting: 7.8   Smokeless tobacco: Never Used  Substance Use Topics   Alcohol use: No   Drug use: No     Allergies   Trazodone and nefazodone, Clarithromycin, Codeine, Duloxetine hcl, Escitalopram oxalate, Fluoxetine, Ibandronic acid, Sulfa antibiotics, Norvasc [amlodipine besylate], Prozac [fluoxetine hcl], and Trazodone hcl   Review of Systems Review of Systems  All other systems reviewed and are negative.    Physical Exam Updated Vital Signs BP 115/77    Pulse 72    Temp 98.6 F (37 C) (Rectal)    Resp 19    Ht 5\' 4"  (1.626 m)    Wt 60.8 kg    SpO2 99%    BMI 23.00 kg/m   Physical Exam Vitals signs and nursing note  reviewed.  Constitutional:      Appearance: She is well-developed.  HENT:     Head: Normocephalic and atraumatic.  Cardiovascular:     Rate and Rhythm: Normal rate and regular rhythm.  Pulmonary:     Effort: Pulmonary effort is normal. No respiratory distress.     Comments: Coarse breath sounds bilaterally Abdominal:     Palpations: Abdomen is soft.     Tenderness: There is no abdominal tenderness. There is no guarding or rebound.  Musculoskeletal:        General: No tenderness.     Comments: BLE edema, left greater than right.   Skin:    General: Skin is warm and dry.  Neurological:     Mental Status: She is alert and oriented to person, place, and time.     Comments: Confused, generalized weakness  Psychiatric:        Behavior: Behavior normal.      ED Treatments / Results  Labs (all labs ordered are listed, but only abnormal results are displayed) Labs Reviewed  COMPREHENSIVE METABOLIC PANEL - Abnormal; Notable for the following components:      Result Value   Sodium 146 (*)    Potassium 3.4 (*)    BUN 24 (*)    Total Protein 6.3 (*)    Albumin 3.2 (*)    All other components within normal limits  CBC WITH DIFFERENTIAL/PLATELET - Abnormal; Notable for the following components:   RBC 3.81 (*)    Hemoglobin 11.4 (*)    Lymphs Abs 0.6 (*)    All other components within normal limits  URINALYSIS, ROUTINE W REFLEX MICROSCOPIC - Abnormal; Notable for the following components:   APPearance CLOUDY (*)    All other components within normal limits  BRAIN NATRIURETIC PEPTIDE - Abnormal; Notable for the following components:   B Natriuretic Peptide 217.3 (*)    All other components within normal limits  D-DIMER, QUANTITATIVE (NOT AT [facility]) - Abnormal; Notable for the following components:   D-Dimer, Quant 8.62 (*)    All other components within normal limits  SARS CORONAVIRUS 2 [facility]  ORDER, PERFORMED IN [place] [facility])  STREP PNEUMONIAE URINARY ANTIGEN    LEGIONELLA PNEUMOPHILA SEROGP 1 UR AG    EKG EKG Interpretation  Date/Time:  Wednesday May 28 2019 09:21:14 EDT Ventricular Rate:  82 PR Interval:    QRS Duration: 138 QT Interval:  424 QTC Calculation: 496 R Axis:   -71 Text Interpretation:  Sinus rhythm Probable left atrial enlargement RBBB and LAFB Left ventricular hypertrophy Confirmed by Quintella Reichert [phone]) on 05/28/2019 12:06:52 PM   Radiology Ct Angio Chest Pe W/cm &/or Wo Cm  Result Date: 05/28/2019 CLINICAL DATA:  Shortness of breath, D-dimer EXAM: CT ANGIOGRAPHY CHEST WITH CONTRAST TECHNIQUE: Multidetector CT imaging of the chest was performed using the standard protocol during bolus administration of intravenous contrast. Multiplanar CT image reconstructions and MIPs were obtained to evaluate the vascular anatomy. CONTRAST:  80mL OMNIPAQUE IOHEXOL 350 MG/ML SOLN COMPARISON:  10/29/2017 FINDINGS: Cardiovascular: Slightly early phase of contrast, which limits evaluation of the distal segmental and subsegmental vessels. Within this limitation, no evidence of pulmonary embolism. No evidence of pulmonary embolism. Aortic atherosclerosis. Normal heart size. Coronary artery calcifications. No pericardial effusion. Mediastinum/Nodes: No enlarged mediastinal, hilar, or axillary lymph nodes. Thyroid gland, trachea, and esophagus demonstrate no significant findings. Lungs/Pleura: There is extensive bilateral, clustered ground-glass and heterogeneous pulmonary opacity, as well as masslike consolidation of the right lower and middle lobes. Small bilateral pleural effusions. Upper Abdomen: No acute abnormality. Musculoskeletal: No chest wall abnormality. Bilateral breast implants. No acute or significant osseous findings. Review of the MIP images confirms the above findings. IMPRESSION: 1. Slightly early phase of contrast, which limits evaluation of the distal segmental and subsegmental vessels. Within this limitation, no evidence of pulmonary  embolism. 2. There is extensive bilateral, clustered ground-glass and heterogeneous pulmonary opacity, as well as masslike consolidation of the right lower and middle lobes. Small bilateral pleural effusions. Findings most likely reflect extensive multifocal infection. Underlying mass of the right lower lobe is not excluded. Recommend CT or radiographic follow-up to complete resolution in 6-8 weeks. 3.  Coronary artery disease and aortic atherosclerosis. Electronically Signed   By: Eddie Candle M.D.   On: 05/28/2019 15:44   Dg Chest Port 1 View  Result Date: 05/28/2019 CLINICAL DATA:  Shortness of breath. EXAM: PORTABLE CHEST 1 VIEW COMPARISON:  Radiographs of November 30, 2018. FINDINGS: Stable cardiomegaly. Stable enlargement of descending thoracic aorta is noted. No pneumothorax is noted. Left lung is clear. Increased right lower lobe opacity is noted concerning for possible pneumonia. No significant pleural effusion is noted. Bony thorax is unremarkable. IMPRESSION: Findings most consistent with right lower lobe pneumonia. Followup PA and lateral chest X-ray is recommended in 3-4 weeks following trial of antibiotic therapy to ensure resolution and exclude underlying malignancy. Electronically Signed   By: Marijo Conception M.D.   On: 05/28/2019 10:08    Procedures Procedures (including critical care time)  Medications Ordered in ED Medications  heparin injection 5,000 Units (has no administration in time range)  potassium chloride SA (K-DUR) CR tablet 40 mEq (has no administration in time range)  cefTRIAXone (ROCEPHIN) 1 g in sodium chloride 0.9 % 100 mL IVPB (0 g Intravenous Stopped 05/28/19 1121)  doxycycline (VIBRAMYCIN) 100 mg in sodium chloride 0.9 % 250 mL IVPB (0 mg Intravenous Stopped 05/28/19 1329)  iohexol (OMNIPAQUE) 350 MG/ML injection 75 mL (75 mLs Intravenous Contrast Given 05/28/19 1517)     Initial Impression / Assessment and Plan / ED Course  I have reviewed the  triage vital signs  and the nursing notes.  Pertinent labs & imaging results that were available during my care of the patient were reviewed by me and considered in my medical decision making (see chart for details).        Patient here for evaluation of shortness of breath, increased oxygen requirement. Imaging is significant for right-sided infiltrate. She was treated with antibiotics for community acquired pneumonia. COVID swab is negative.Discussed with patient's power of attorney as well as patient findings of studies. Discussed recommendation for admission for ongoing treatment. Hospitalist consulted for admission.  Final Clinical Impressions(s) / ED Diagnoses   Final diagnoses:  Community acquired pneumonia of right lung, unspecified part of lung  Shortness of breath    ED Discharge Orders    None       Quintella Reichert, MD 05/28/19 1615

## 2019-05-28 NOTE — H&P (Signed)
TRH H&P   Patient Demographics:    Margaret Li, is a 85 y.o. female  MRN: [id number]   DOB - 05-18-1929  Admit Date - 05/28/2019  Outpatient Primary MD for the patient is Reymundo Poll, MD  Referring MD/NP/PA: Dr Ralene Bathe  Patient coming from: [facility]  Chief Complaint  Patient presents with   Shortness of Breath      HPI:    Margaret Li  is a 85 y.o. female, with past medical history significant for hypertension, history of CVA, hypothyroidism, depression with anxiety, AAA, recent hospitalization last March secondary to diverticular GI bleed, patient presents to emergency department secondary to complaints of shortness of breath, patient is demented, but she is able to answer questions appropriately, reports shortness of breath for last 3 days, progressive, minimally ambulatory, worsened by laying supine, she denies fever, chills, cough, diarrhea, nausea or vomiting, S patient was hypoxic 88% on room air, currently she is on 3 L nasal cannula in ED, which I have stopped and she remains saturating 98%. - in ED her chest x-ray significant for opacity suspicious for community-acquired pneumonia, she was started on doxycycline and Rocephin, she was hypoxic by EMS 88% on room air, she was on 3 L nasal cannula, which I have stopped, she remained 98% on room air, she has lower extremity edema, elevated proBNP, and distended JVD as well, I was requested to admit for further evaluation, the 19 test was still pending at time of my evaluation.    Review of systems:    In addition to the HPI above No Fever-chills, No Headache, No changes with Vision or hearing, No problems swallowing food or Liquids, No Chest pain, Cough he does report some dyspnea  no Abdominal pain, No Nausea or Vommitting, Bowel movements are regular, No Blood in stool or Urine, No dysuria, No new skin rashes or  bruises, No new joints pains-aches,  No new weakness, tingling, numbness in any extremity, he does report some generalized weakness No recent weight gain or loss, No polyuria, polydypsia or polyphagia, No significant Mental Stressors.  A full 10 point Review of Systems was done, except as stated above, all other Review of Systems were negative.   With Past History of the following :    Past Medical History:  Diagnosis Date   AAA (abdominal aortic aneurysm) ([place])    infrarenal, 3.2 cm. seen on CTA Dec 2018   Anxiety    Aortic calcification (HCC)    Black tarry stools    "per patient"   Cholelithiasis    Chronic low back pain    Depression    Diverticular disease 1999   h/o diverticular block    GERD (gastroesophageal reflux disease)    Glaucoma    H/O hyperthyroidism    Hypercholesterolemia    Hypertension    Hypothyroidism    Migraine    Osteoarthritis  Osteoporosis    Tubular adenoma    h/o   Vitamin B 12 deficiency       Past Surgical History:  Procedure Laterality Date   ABDOMINAL HYSTERECTOMY     80's   BREAST ENHANCEMENT SURGERY     CATARACT EXTRACTION, BILATERAL     ESOPHAGOGASTRODUODENOSCOPY (EGD) WITH PROPOFOL N/A 08/04/2016   Procedure: ESOPHAGOGASTRODUODENOSCOPY (EGD) WITH PROPOFOL;  Surgeon: Carol Ada, MD;  Location: [facility];  Service: Endoscopy;  Laterality: N/A;   HERNIA REPAIR Right    Inguinal hernia repair '12 -Dr. Rise Patience   JOINT REPLACEMENT Right    -RTHA -4 '11 - Dr. [name]- [facility]      Social History:     Social History   Tobacco Use   Smoking status: Former Smoker    Types: Cigarettes    Quit date: 08/01/2011    Years since quitting: 7.8   Smokeless tobacco: Never Used  Substance Use Topics   Alcohol use: No     Lives - at ALF  Mobility - with walker      Family History :     Family History  Problem Relation Age of Onset   Breast cancer Sister        half sister   Heart  disease Brother        half brother     Home Medications:   Prior to Admission medications   Medication Sig Start Date End Date Taking? Authorizing Provider  acetaminophen (TYLENOL) 500 MG tablet Take 500 mg by mouth every 6 (six) hours as needed for mild pain or headache.   Yes [provider]  antiseptic oral rinse (BIOTENE) LIQD 30 mLs by Mouth Rinse route 2 (two) times daily.   Yes [provider]  aspirin EC 81 MG EC tablet Take 1 tablet (81 mg total) by mouth daily. 11/16/18  Yes Florencia Reasons, MD  bisacodyl (DULCOLAX) 10 MG suppository Place 1 suppository (10 mg total) rectally every Sunday. 11/17/18  Yes Florencia Reasons, MD  busPIRone (BUSPAR) 5 MG tablet Take 2.5 mg by mouth 2 (two) times daily.    Yes [provider]  calcium elemental as carbonate (ANTACID MAXIMUM) 400 MG chewable tablet Chew 1,000 mg by mouth at bedtime.   Yes [provider]  dorzolamide-timolol (COSOPT) 22.3-6.8 MG/ML ophthalmic solution Place 1 drop into both eyes 2 (two) times daily.   Yes [provider]  ENSURE (ENSURE) Take 237 mLs by mouth 2 (two) times daily. Chocolate   Yes [provider]  fluticasone (FLONASE) 50 MCG/ACT nasal spray Place 1 spray into both nostrils daily.   Yes [provider]  furosemide (LASIX) 20 MG tablet Take 20 mg by mouth daily.  05/12/19  Yes [provider]  guaiFENesin (DIABETIC TUSSIN EX) 100 MG/5ML liquid Take 200 mg by mouth every 4 (four) hours as needed for cough or congestion.   Yes [provider]  labetalol (NORMODYNE) 100 MG tablet Take 100 mg by mouth 2 (two) times daily. Hold for SBP <100 or pulse <55   Yes [provider]  levothyroxine (SYNTHROID, LEVOTHROID) 50 MCG tablet Take 50 mcg by mouth daily before breakfast.    Yes [provider]  losartan (COZAAR) 100 MG tablet Take 100 mg by mouth at bedtime.  06/01/15  Yes [provider]  magnesium oxide (MAG-OX) 400 MG tablet  Take 1 tablet (400 mg total) by mouth daily. 11/15/18  Yes Florencia Reasons, MD  memantine [facility]) 5 MG tablet  Take 5 mg by mouth 2 (two) times daily.    Yes [provider]  mirtazapine (REMERON) 7.5 MG tablet Take 7.5 mg by mouth at bedtime.   Yes [provider]  Multiple Vitamins-Minerals (MULTIVITAMIN WOMEN 50+) TABS Take 1 tablet by mouth daily. GUMMIE   Yes [provider]  Netarsudil Dimesylate 0.02 % SOLN Place 1 drop into both eyes at bedtime. "Rhopressa" 09/07/17  Yes [provider]  nitroGLYCERIN (NITROSTAT) 0.4 MG SL tablet Place 1 tablet (0.4 mg total) under the tongue every 5 (five) minutes as needed for chest pain (q26mins up to three times). 11/15/18  Yes Florencia Reasons, MD  OLANZapine (ZYPREXA) 7.5 MG tablet Take 7.5 mg by mouth at bedtime.   Yes [provider]  pantoprazole (PROTONIX) 40 MG tablet Take 1 tablet (40 mg total) by mouth daily. 11/16/18  Yes Florencia Reasons, MD  polyethylene glycol [facility] Floria Raveling) packet Take 17 g by mouth 2 (two) times daily. Patient taking differently: Take 17 g by mouth daily as needed for mild constipation or moderate constipation.  11/15/18  Yes Florencia Reasons, MD  potassium chloride 20 MEQ/15ML (10%) SOLN Take 15 mLs by mouth every Monday, Wednesday, and Friday. 05/19/19  Yes [provider]  PROCTOZONE-HC 2.5 % rectal cream Place 1 application rectally as needed. 04/16/19  Yes [provider]  sennosides-docusate sodium (SENOKOT-S) 8.6-50 MG tablet Take 1 tablet by mouth daily.   Yes [provider]  sertraline (ZOLOFT) 50 MG tablet Take 50 mg by mouth daily.   Yes [provider]  Suvorexant (BELSOMRA) 5 MG TABS Take 5 mg by mouth at bedtime.   Yes [provider]  vitamin B-12 (CYANOCOBALAMIN) 1000 MCG tablet Take 1,000 mcg by mouth daily.   Yes [provider]  hydrALAZINE (APRESOLINE) 10 MG tablet Take 1 tablet (10 mg total) by mouth 3 (three) times daily for 30 days.  11/15/18 12/15/18  Florencia Reasons, MD     Allergies:     Allergies  Allergen Reactions   Trazodone And Nefazodone Anaphylaxis   Clarithromycin     GI side effects Other reaction(s): Other GI side effects   Codeine Other (See Comments)    GI side effects Other reaction(s): Other GI side effects   Duloxetine Hcl     GI side effects Other reaction(s): Other GI side effects   Escitalopram Oxalate Nausea Only and Other (See Comments)    dizziness Other reaction(s): Dizziness   Fluoxetine     Other reaction(s): Other Abnormal weight loss; GI side effects   Ibandronic Acid     Groin pain Other reaction(s): Other Groin pain   Sulfa Antibiotics Other (See Comments)    Unknown reaction  Other reaction(s): Unknown   Norvasc [Amlodipine Besylate] Other (See Comments)    Abdominal pain, bad dreams   Prozac [Fluoxetine Hcl] Other (See Comments)    Abnormal weight loss, GI upset    Trazodone Hcl Swelling and Rash    Swollen tongue      Physical Exam:   Vitals  Blood pressure (!) 165/94, pulse 70, temperature 98.6 F (37 C), temperature source Rectal, resp. rate 18, height 5\' 4"  (1.626 m), weight 60.8 kg, SpO2 99 %.   1. General , frail, laying in bed in no apparent distress  2. Normal affect , Not Suicidal or Homicidal, Awake Alert, Oriented X 3.  Forgetful, impaired insight  3. No F.N deficits, ALL C.Nerves Intact, Strength grossly 5/5 all 4 extremities, Sensation intact  all 4 extremities, Plantars down going.  4. Ears and Eyes appear Normal, Conjunctivae clear, PERRLA. Moist Oral Mucosa.  + JVD  5. Supple Neck, No JVD, No cervical lymphadenopathy appriciated, No Carotid Bruits.  6. Symmetrical Chest wall movement, Good air movement bilaterally, CTAB.  7. RRR, No Gallops, Rubs or Murmurs, No Parasternal Heave.  +2 edema  8. Positive Bowel Sounds, Abdomen Soft, No tenderness, No organomegaly appriciated,No rebound -guarding or rigidity.  9.  No Cyanosis, Normal  Skin Turgor, No Skin Rash or Bruise.  10. Good muscle tone,  joints appear normal , no effusions, Normal ROM.  11. No Palpable Lymph Nodes in Neck or Axillae     Data Review:    CBC Recent Labs  Lab 05/28/19 0923  WBC 7.5  HGB 11.4*  HCT 37.1  PLT 232  MCV 97.4  MCH 29.9  MCHC 30.7  RDW 14.6  LYMPHSABS 0.6*  MONOABS 0.7  EOSABS 0.1  BASOSABS 0.0   ------------------------------------------------------------------------------------------------------------------  Chemistries  Recent Labs  Lab 05/28/19 0923  NA 146*  K 3.4*  CL 108  CO2 29  GLUCOSE 97  BUN 24*  CREATININE 0.67  CALCIUM 9.2  AST 15  ALT 10  ALKPHOS 75  BILITOT 0.5   ------------------------------------------------------------------------------------------------------------------ estimated creatinine clearance is 42 mL/min (by C-G formula based on SCr of 0.67 mg/dL). ------------------------------------------------------------------------------------------------------------------ No results for input(s): TSH, T4TOTAL, T3FREE, THYROIDAB in the last 72 hours.  Invalid input(s): FREET3  Coagulation profile No results for input(s): INR, PROTIME in the last 168 hours. ------------------------------------------------------------------------------------------------------------------- No results for input(s): DDIMER in the last 72 hours. -------------------------------------------------------------------------------------------------------------------  Cardiac Enzymes No results for input(s): CKMB, TROPONINI, MYOGLOBIN in the last 168 hours.  Invalid input(s): CK ------------------------------------------------------------------------------------------------------------------    Component Value Date/Time   BNP 217.3 (H) 05/28/2019 0924     ---------------------------------------------------------------------------------------------------------------  Urinalysis    Component Value Date/Time    COLORURINE AMBER (A) 11/30/2018 1719   APPEARANCEUR HAZY (A) 11/30/2018 1719   LABSPEC 1.031 (H) 11/30/2018 1719   PHURINE 5.0 11/30/2018 1719   GLUCOSEU NEGATIVE 11/30/2018 1719   HGBUR NEGATIVE 11/30/2018 1719   BILIRUBINUR SMALL (A) 11/30/2018 1719   KETONESUR 5 (A) 11/30/2018 1719   PROTEINUR NEGATIVE 11/30/2018 1719   UROBILINOGEN 1.0 02/16/2010 1215   NITRITE NEGATIVE 11/30/2018 1719   LEUKOCYTESUR NEGATIVE 11/30/2018 1719    ----------------------------------------------------------------------------------------------------------------   Imaging Results:    Dg Chest Port 1 View  Result Date: 05/28/2019 CLINICAL DATA:  Shortness of breath. EXAM: PORTABLE CHEST 1 VIEW COMPARISON:  Radiographs of November 30, 2018. FINDINGS: Stable cardiomegaly. Stable enlargement of descending thoracic aorta is noted. No pneumothorax is noted. Left lung is clear. Increased right lower lobe opacity is noted concerning for possible pneumonia. No significant pleural effusion is noted. Bony thorax is unremarkable. IMPRESSION: Findings most consistent with right lower lobe pneumonia. Followup PA and lateral chest X-ray is recommended in 3-4 weeks following trial of antibiotic therapy to ensure resolution and exclude underlying malignancy. Electronically Signed   By: Marijo Conception M.D.   On: 05/28/2019 10:08    My personal review of EKG: Rhythm NSR, sinus rhythm, heart rate at 82, QTC of 496, with RBBB, and LAFB    Assessment & Plan:    Active Problems:   Hypertension   Hypothyroidism   Vision loss, left eye   CKD (chronic kidney disease), stage II   History of CVA (cerebrovascular accident)   Dementia ([place])   CAP (community acquired pneumonia)    Shortness  of breath -Does appear patient having infectious process including community-acquired pneumonia, ruling out COVID-19 given she was hypoxic on presentation with x-ray findings. -As well some evidence of volume overload given lower  extremity edema, evaded proBNP, and known history of diastolic CHF -She is [id number] negative  Community-acquired pneumonia -Treated with Rocephin, doxycycline, check sputum culture, Legionella and strep antigen  Acute on chronic diastolic CHF -Recent echo in January with a preserved EF at 84%, grade 1 diastolic dysfunction -We will start on Lasix 40 mg IV daily, monitor daily weight, strict ins and outs.  Elevated D-dimers -We will check CTA chest, venous Dopplers especially patient reports she has been less ambulatory after a fall couple months ago.  Hypernatremia -Mild, at 146, monitor closely as will be on IV diuresis  Hypokalemia -Mild, will replete  Dementia -Continue with home meds  Hypertension -Blood pressure acceptable currently, elevated on presentation, continue with home meds will add PRN hydralazine  Anxiety/depression -Continue with home meds  Hypothyroidism -Continue with home dose Synthroid  History of CVA -She denies any new focal deficits, continue with aspirin  DVT Prophylaxis Heparin - SCDs  AM Labs Ordered, also please review Full Orders  Family Communication: Admission, patients condition and plan of care including tests being ordered have been discussed with the patient and OA is Christiane Ha Lloyd(cousin)  - [phone] (home) [phone]  who indicate understanding and agree with the plan and Code Status.  Code Status DNR  Likely DC to [facility]   Condition GUARDED   Consults called: None  Admission status: Observation  Time spent in minutes : 65 minutes   Phillips Climes M.D on 05/28/2019 at 12:42 PM  Between 7am to 7pm - Pager - [phone]. After 7pm go to [email or web address] - password [facility]  [facility] - Office  [phone]

## 2019-05-29 ENCOUNTER — Observation Stay (HOSPITAL_COMMUNITY): Payer: Medicare HMO

## 2019-05-29 DIAGNOSIS — R609 Edema, unspecified: Secondary | ICD-10-CM

## 2019-05-29 DIAGNOSIS — J189 Pneumonia, unspecified organism: Secondary | ICD-10-CM | POA: Diagnosis present

## 2019-05-29 DIAGNOSIS — E538 Deficiency of other specified B group vitamins: Secondary | ICD-10-CM | POA: Diagnosis present

## 2019-05-29 DIAGNOSIS — R7989 Other specified abnormal findings of blood chemistry: Secondary | ICD-10-CM | POA: Diagnosis not present

## 2019-05-29 DIAGNOSIS — E876 Hypokalemia: Secondary | ICD-10-CM | POA: Diagnosis present

## 2019-05-29 DIAGNOSIS — R269 Unspecified abnormalities of gait and mobility: Secondary | ICD-10-CM | POA: Diagnosis present

## 2019-05-29 DIAGNOSIS — E039 Hypothyroidism, unspecified: Secondary | ICD-10-CM | POA: Diagnosis present

## 2019-05-29 DIAGNOSIS — H409 Unspecified glaucoma: Secondary | ICD-10-CM | POA: Diagnosis present

## 2019-05-29 DIAGNOSIS — Z20828 Contact with and (suspected) exposure to other viral communicable diseases: Secondary | ICD-10-CM | POA: Diagnosis present

## 2019-05-29 DIAGNOSIS — F418 Other specified anxiety disorders: Secondary | ICD-10-CM | POA: Diagnosis present

## 2019-05-29 DIAGNOSIS — I110 Hypertensive heart disease with heart failure: Secondary | ICD-10-CM | POA: Diagnosis present

## 2019-05-29 DIAGNOSIS — I5033 Acute on chronic diastolic (congestive) heart failure: Secondary | ICD-10-CM | POA: Diagnosis present

## 2019-05-29 DIAGNOSIS — Z96641 Presence of right artificial hip joint: Secondary | ICD-10-CM | POA: Diagnosis present

## 2019-05-29 DIAGNOSIS — E7800 Pure hypercholesterolemia, unspecified: Secondary | ICD-10-CM | POA: Diagnosis present

## 2019-05-29 DIAGNOSIS — E870 Hyperosmolality and hypernatremia: Secondary | ICD-10-CM | POA: Diagnosis present

## 2019-05-29 DIAGNOSIS — Z66 Do not resuscitate: Secondary | ICD-10-CM | POA: Diagnosis present

## 2019-05-29 DIAGNOSIS — I714 Abdominal aortic aneurysm, without rupture: Secondary | ICD-10-CM | POA: Diagnosis present

## 2019-05-29 DIAGNOSIS — M810 Age-related osteoporosis without current pathological fracture: Secondary | ICD-10-CM | POA: Diagnosis present

## 2019-05-29 DIAGNOSIS — F0390 Unspecified dementia without behavioral disturbance: Secondary | ICD-10-CM | POA: Diagnosis present

## 2019-05-29 DIAGNOSIS — K219 Gastro-esophageal reflux disease without esophagitis: Secondary | ICD-10-CM | POA: Diagnosis present

## 2019-05-29 DIAGNOSIS — I452 Bifascicular block: Secondary | ICD-10-CM | POA: Diagnosis present

## 2019-05-29 DIAGNOSIS — Z8673 Personal history of transient ischemic attack (TIA), and cerebral infarction without residual deficits: Secondary | ICD-10-CM | POA: Diagnosis not present

## 2019-05-29 DIAGNOSIS — H5462 Unqualified visual loss, left eye, normal vision right eye: Secondary | ICD-10-CM | POA: Diagnosis present

## 2019-05-29 DIAGNOSIS — J9601 Acute respiratory failure with hypoxia: Secondary | ICD-10-CM | POA: Diagnosis present

## 2019-05-29 DIAGNOSIS — K5909 Other constipation: Secondary | ICD-10-CM | POA: Diagnosis present

## 2019-05-29 LAB — BASIC METABOLIC PANEL
Anion gap: 10 (ref 5–15)
BUN: 24 mg/dL — ABNORMAL HIGH (ref 8–23)
CO2: 29 mmol/L (ref 22–32)
Calcium: 9.2 mg/dL (ref 8.9–10.3)
Chloride: 104 mmol/L (ref 98–111)
Creatinine, Ser: 0.71 mg/dL (ref 0.44–1.00)
GFR calc Af Amer: >60 mL/min (ref >60)
GFR calc non Af Amer: >60 mL/min (ref >60)
Glucose, Bld: 108 mg/dL — ABNORMAL HIGH (ref 70–99)
Potassium: 4.1 mmol/L (ref 3.5–5.1)
Sodium: 143 mmol/L (ref 135–145)

## 2019-05-29 LAB — LEGIONELLA PNEUMOPHILA SEROGP 1 UR AG: L. pneumophila Serogp 1 Ur Ag: NEGATIVE

## 2019-05-29 LAB — PROCALCITONIN: Procalcitonin: 1.5 ng/mL

## 2019-05-29 MED ORDER — DM-GUAIFENESIN ER 30-600 MG PO TB12
2.0000 | ORAL_TABLET | Freq: Two times a day (BID) | ORAL | Status: DC
  Administered 2019-05-29 – 2019-06-02 (×8): 2 via ORAL
  Filled 2019-05-29 (×4): qty 2
  Filled 2019-05-29 (×3): qty 1
  Filled 2019-05-29: qty 2
  Filled 2019-05-29 (×2): qty 1

## 2019-05-29 MED ORDER — IPRATROPIUM-ALBUTEROL 0.5-2.5 (3) MG/3ML IN SOLN
3.0000 mL | Freq: Three times a day (TID) | RESPIRATORY_TRACT | Status: DC
  Administered 2019-05-29 – 2019-05-30 (×2): 3 mL via RESPIRATORY_TRACT
  Filled 2019-05-29 (×2): qty 3

## 2019-05-29 NOTE — Progress Notes (Signed)
Lower extremity venous has been completed.   Preliminary results in CV Proc.   Abram Sander 05/29/2019 10:32 AM

## 2019-05-29 NOTE — Progress Notes (Addendum)
PROGRESS NOTE  Margaret Li QQI:[id number] DOB: [birth year] DOA: 05/28/2019 PCP: Reymundo Poll, MD  HPI/Recap of past 24 hours: Margaret Li  is a 85 y.o. female, with past medical history significant for hypertension, history of CVA, hypothyroidism, depression with anxiety, AAA, recent hospitalization last March secondary to diverticular GI bleed, patient presents to emergency department secondary to complaints of shortness of breath, patient is demented, but she is able to answer questions appropriately, reports shortness of breath for last 3 days, progressive, minimally ambulatory, worsened by laying supine, she denies fever, chills, cough, diarrhea, nausea or vomiting, S patient was hypoxic 88% on room air, currently she is on 3 L nasal cannula in ED, which I have stopped and she remains saturating 98%. - in ED her chest x-ray significant for opacity suspicious for community-acquired pneumonia, she was started on doxycycline and Rocephin, she was hypoxic by EMS 88% on room air, she was on 3 L nasal cannula, which I have stopped, she remained 98% on room air, she has lower extremity edema, elevated proBNP, and distended JVD as well, I was requested to admit for further evaluation, the 19 test was still pending at time of my evaluation.  05/29/19: Patient was seen and examined at her bedside.  She is pleasantly demented and answers questions appropriately.  Her breathing is improved this morning but still has persistent dry cough.  Not on O2 supplementation at baseline.  She denies chest pain.  Assessment/Plan: Active Problems:   Hypertension   Hypothyroidism   Vision loss, left eye   CKD (chronic kidney disease), stage II   History of CVA (cerebrovascular accident)   Dementia ([place])   CAP (community acquired pneumonia)  Multifocal community-acquired pneumonia Presented with shortness of breath and hypoxia Elevated procalcitonin 1.50 on 05/29/2019 Independently reviewed CTA PE which showed  bilateral pulmonary infiltrates and bilateral pleural effusions right greater than left. Continue Rocephin, p.o. doxycycline Continue to maintain O2 saturation greater than 92% Start bronchodilators and antitussives  Acute hypoxic respiratory failure secondary to multifocal community-acquired pneumonia Management as stated above  Acute on chronic diastolic CHF Appears hypervolemic on exam with right JVD and bilateral lower extremity 1+ pitting edema Elevated BNP Last 2D echo done in January showed preserved LVEF 55% with grade 1 diastolic dysfunction Continue IV Lasix 40 mg daily Continue to monitor strict I's and O's and daily weight  Elevated D-dimers CTA PE independently reviewed showed no sign of pulmonary embolism Doppler ultrasound B/L lower extremity showed no sign of acute DVT  Essential hypertension Blood pressure is normotensive Continue p.o. hydralazine 10 mg 3 times daily, labetalol 100 mg twice daily, p.o. losartan 100 mg daily  Hypothyroidism Continue levothyroxine  GERD Continue PPI  Dementia with no behavioral disturbance Continue home medications  History of CVA No acute issues Continue aspirin  Risks: Patient is high risk for decompensation due to ongoing multifocal community-acquired pneumonia requiring IV antibiotics, acute hypoxic respiratory failure secondary to community-acquired pneumonia, multiple comorbidities and advanced age.  Patient will require at least 2 midnights for further evaluation and treatment of present condition.   DVT Prophylaxis Heparin subcu 3 times daily- SCDs  AM Labs Ordered, also please review Full Orders  Family Communication: Admission, patients condition and plan of care including tests being ordered have been discussed with the patient and OA is Christiane Ha Lloyd(cousin)  - [phone] (home) [phone]  who indicate understanding and agree with the plan and Code Status.  Code Status DNR  Likely [facility] to [facility]  green  when her oxygen requirement is close to baseline.   Consults called: None    Objective: Vitals:   05/28/19 2016 05/29/19 0438 05/29/19 0500 05/29/19 0915  BP: 115/83 (!) 148/95  (!) 139/91  Pulse: 79 72  74  Resp: 16 16  14   Temp: 97.9 F (36.6 C) 98 F (36.7 C)  97.8 F (36.6 C)  TempSrc: Oral Oral  Oral  SpO2: 100% 99%  100%  Weight:   60.2 kg   Height:        Intake/Output Summary (Last 24 hours) at 05/29/2019 1050 Last data filed at 05/29/2019 0900 Gross per 24 hour  Intake 0.4 ml  Output 600 ml  Net -599.6 ml   Filed Weights   05/28/19 0925 05/29/19 0500  Weight: 60.8 kg 60.2 kg    Exam:   General: 85 y.o. year-old female well developed well nourished in no acute distress.  Alert and pleasantly demented.  Cardiovascular: Regular rate and rhythm with no rubs or gallops.  No thyromegaly.  Right JVD present.  Respiratory: Diffuse crackles bilaterally with no wheezes noted.  Poor inspiratory effort.  Abdomen: Soft nontender nondistended with normal bowel sounds x4 quadrants.  Musculoskeletal: 1+ pitting edema in lower extremities bilaterally.  2/4 pulses in all 4 extremities.  Psychiatry: Mood is appropriate for condition and setting   Data Reviewed: CBC: Recent Labs  Lab 05/28/19 0923  WBC 7.5  NEUTROABS 6.1  HGB 11.4*  HCT 37.1  MCV 97.4  PLT 664   Basic Metabolic Panel: Recent Labs  Lab 05/28/19 0923 05/29/19 0625  NA 146* 143  K 3.4* 4.1  CL 108 104  CO2 29 29  GLUCOSE 97 108*  BUN 24* 24*  CREATININE 0.67 0.71  CALCIUM 9.2 9.2   GFR: Estimated Creatinine Clearance: 42 mL/min (by C-G formula based on SCr of 0.71 mg/dL). Liver Function Tests: Recent Labs  Lab 05/28/19 0923  AST 15  ALT 10  ALKPHOS 75  BILITOT 0.5  PROT 6.3*  ALBUMIN 3.2*   No results for input(s): LIPASE, AMYLASE in the last 168 hours. No results for input(s): AMMONIA in the last 168 hours. Coagulation Profile: No results for input(s): INR,  PROTIME in the last 168 hours. Cardiac Enzymes: No results for input(s): CKTOTAL, CKMB, CKMBINDEX, TROPONINI in the last 168 hours. BNP (last 3 results) No results for input(s): PROBNP in the last 8760 hours. HbA1C: No results for input(s): HGBA1C in the last 72 hours. CBG: No results for input(s): GLUCAP in the last 168 hours. Lipid Profile: No results for input(s): CHOL, HDL, LDLCALC, TRIG, CHOLHDL, LDLDIRECT in the last 72 hours. Thyroid Function Tests: No results for input(s): TSH, T4TOTAL, FREET4, T3FREE, THYROIDAB in the last 72 hours. Anemia Panel: No results for input(s): VITAMINB12, FOLATE, FERRITIN, TIBC, IRON, RETICCTPCT in the last 72 hours. Urine analysis:    Component Value Date/Time   COLORURINE YELLOW 05/28/2019 1312   APPEARANCEUR CLOUDY (A) 05/28/2019 1312   LABSPEC 1.014 05/28/2019 1312   PHURINE 7.0 05/28/2019 1312   GLUCOSEU NEGATIVE 05/28/2019 1312   HGBUR NEGATIVE 05/28/2019 1312   BILIRUBINUR NEGATIVE 05/28/2019 1312   KETONESUR NEGATIVE 05/28/2019 1312   PROTEINUR NEGATIVE 05/28/2019 1312   UROBILINOGEN 1.0 02/16/2010 1215   NITRITE NEGATIVE 05/28/2019 1312   LEUKOCYTESUR NEGATIVE 05/28/2019 1312   Sepsis Labs: @LABRCNTIP (procalcitonin:4,lacticidven:4)  ) Recent Results (from the past 240 hour(s))  SARS Coronavirus 2 (CEPHEID- Performed in [place] hospital lab), Hosp Order     Status:  None   Collection Time: 05/28/19 12:25 PM   Specimen: Nasopharyngeal Swab  Result Value Ref Range Status   SARS Coronavirus 2 NEGATIVE NEGATIVE Final    Comment: (NOTE) If result is NEGATIVE SARS-CoV-2 target nucleic acids are NOT DETECTED. The SARS-CoV-2 RNA is generally detectable in upper and lower  respiratory specimens during the acute phase of infection. The lowest  concentration of SARS-CoV-2 viral copies this assay can detect is 250  copies / mL. A negative result does not preclude SARS-CoV-2 infection  and should not be used as the sole basis for  treatment or other  patient management decisions.  A negative result may occur with  improper specimen collection / handling, submission of specimen other  than nasopharyngeal swab, presence of viral mutation(s) within the  areas targeted by this assay, and inadequate number of viral copies  (<250 copies / mL). A negative result must be combined with clinical  observations, patient history, and epidemiological information. If result is POSITIVE SARS-CoV-2 target nucleic acids are DETECTED. The SARS-CoV-2 RNA is generally detectable in upper and lower  respiratory specimens dur ing the acute phase of infection.  Positive  results are indicative of active infection with SARS-CoV-2.  Clinical  correlation with patient history and other diagnostic information is  necessary to determine patient infection status.  Positive results do  not rule out bacterial infection or co-infection with other viruses. If result is PRESUMPTIVE POSTIVE SARS-CoV-2 nucleic acids MAY BE PRESENT.   A presumptive positive result was obtained on the submitted specimen  and confirmed on repeat testing.  While 2019 novel coronavirus  (SARS-CoV-2) nucleic acids may be present in the submitted sample  additional confirmatory testing may be necessary for epidemiological  and / or clinical management purposes  to differentiate between  SARS-CoV-2 and other Sarbecovirus currently known to infect humans.  If clinically indicated additional testing with an alternate test  methodology [phone]) is advised. The SARS-CoV-2 RNA is generally  detectable in upper and lower respiratory sp ecimens during the acute  phase of infection. The expected result is Negative. Fact Sheet for Patients:  StrictlyIdeas.no Fact Sheet for Healthcare Providers: BankingDealers.co.za This test is not yet approved or cleared by the [place] FDA and has been authorized for detection and/or  diagnosis of SARS-CoV-2 by FDA under an Emergency Use Authorization (EUA).  This EUA will remain in effect (meaning this test can be used) for the duration of the COVID-19 declaration under Section 564(b)(1) of the Act, 21 U.S.C. section 360bbb-3(b)(1), unless the authorization is terminated or revoked sooner. Performed at [facility], [place] [address], [place], [place] [address]   MRSA PCR Screening     Status: None   Collection Time: 05/28/19  9:03 PM   Specimen: Nasopharyngeal  Result Value Ref Range Status   MRSA by PCR NEGATIVE NEGATIVE Final    Comment:        The GeneXpert MRSA Assay (FDA approved for NASAL specimens only), is one component of a comprehensive MRSA colonization surveillance program. It is not intended to diagnose MRSA infection nor to guide or monitor treatment for MRSA infections. Performed at [facility], [place] [address] [place], [place] [address]       Studies: Ct Angio Chest Pe W/cm &/or Wo Cm  Result Date: 05/28/2019 CLINICAL DATA:  Shortness of breath, D-dimer EXAM: CT ANGIOGRAPHY CHEST WITH CONTRAST TECHNIQUE: Multidetector CT imaging of the chest was performed using the standard protocol during bolus administration of intravenous  contrast. Multiplanar CT image reconstructions and MIPs were obtained to evaluate the vascular anatomy. CONTRAST:  34mL OMNIPAQUE IOHEXOL 350 MG/ML SOLN COMPARISON:  10/29/2017 FINDINGS: Cardiovascular: Slightly early phase of contrast, which limits evaluation of the distal segmental and subsegmental vessels. Within this limitation, no evidence of pulmonary embolism. No evidence of pulmonary embolism. Aortic atherosclerosis. Normal heart size. Coronary artery calcifications. No pericardial effusion. Mediastinum/Nodes: No enlarged mediastinal, hilar, or axillary lymph nodes. Thyroid gland, trachea, and esophagus demonstrate no significant findings. Lungs/Pleura: There is extensive bilateral, clustered ground-glass and  heterogeneous pulmonary opacity, as well as masslike consolidation of the right lower and middle lobes. Small bilateral pleural effusions. Upper Abdomen: No acute abnormality. Musculoskeletal: No chest wall abnormality. Bilateral breast implants. No acute or significant osseous findings. Review of the MIP images confirms the above findings. IMPRESSION: 1. Slightly early phase of contrast, which limits evaluation of the distal segmental and subsegmental vessels. Within this limitation, no evidence of pulmonary embolism. 2. There is extensive bilateral, clustered ground-glass and heterogeneous pulmonary opacity, as well as masslike consolidation of the right lower and middle lobes. Small bilateral pleural effusions. Findings most likely reflect extensive multifocal infection. Underlying mass of the right lower lobe is not excluded. Recommend CT or radiographic follow-up to complete resolution in 6-8 weeks. 3.  Coronary artery disease and aortic atherosclerosis. Electronically Signed   By: Eddie Candle M.D.   On: 05/28/2019 15:44   Vas [place] Lower Extremity Venous (dvt)  Result Date: 05/29/2019  Lower Venous Study Indications: Edema, and elevated d dimer.  Comparison Study: no prior Performing Technologist: Abram Sander RVS  Examination Guidelines: A complete evaluation includes B-mode imaging, spectral Doppler, color Doppler, and power Doppler as needed of all accessible portions of each vessel. Bilateral testing is considered an integral part of a complete examination. Limited examinations for reoccurring indications may be performed as noted.  +---------+---------------+---------+-----------+----------+-------+  RIGHT     Compressibility Phasicity Spontaneity Properties Summary  +---------+---------------+---------+-----------+----------+-------+  CFV       Full            Yes       Yes                             +---------+---------------+---------+-----------+----------+-------+  SFJ       Full                                                       +---------+---------------+---------+-----------+----------+-------+  FV Prox   Full                                                      +---------+---------------+---------+-----------+----------+-------+  FV Mid    Full                                                      +---------+---------------+---------+-----------+----------+-------+  FV Distal Full                                                      +---------+---------------+---------+-----------+----------+-------+  PFV       Full                                                      +---------+---------------+---------+-----------+----------+-------+  POP       Full            Yes       Yes                             +---------+---------------+---------+-----------+----------+-------+  PTV       Full                                                      +---------+---------------+---------+-----------+----------+-------+  PERO      Full                                                      +---------+---------------+---------+-----------+----------+-------+   +---------+---------------+---------+-----------+----------+--------------+  LEFT      Compressibility Phasicity Spontaneity Properties Summary         +---------+---------------+---------+-----------+----------+--------------+  CFV       Full            Yes       Yes                                    +---------+---------------+---------+-----------+----------+--------------+  SFJ       Full                                                             +---------+---------------+---------+-----------+----------+--------------+  FV Prox   Full                                                             +---------+---------------+---------+-----------+----------+--------------+  FV Mid    Full                                                             +---------+---------------+---------+-----------+----------+--------------+  FV Distal Full                                                              +---------+---------------+---------+-----------+----------+--------------+  PFV       Full                                                             +---------+---------------+---------+-----------+----------+--------------+  POP       Full            Yes       Yes                                    +---------+---------------+---------+-----------+----------+--------------+  PTV       Full                                                             +---------+---------------+---------+-----------+----------+--------------+  PERO                                                       Not visualized  +---------+---------------+---------+-----------+----------+--------------+     Summary: Right: There is no evidence of deep vein thrombosis in the lower extremity. No cystic structure found in the popliteal fossa. Left: There is no evidence of deep vein thrombosis in the lower extremity. No cystic structure found in the popliteal fossa.  *See table(s) above for measurements and observations.    Preliminary     Scheduled Meds:  aspirin EC  81 mg Oral Daily   [START ON 06/01/2019] bisacodyl  10 mg Rectal Q Sun   busPIRone  2.5 mg Oral BID   calcium carbonate  1,000 mg Oral QHS   dorzolamide-timolol  1 drop Both Eyes BID   doxycycline  100 mg Oral Q12H   feeding supplement (ENSURE ENLIVE)  237 mL Oral BID   fluticasone  1 spray Each Nare Daily   heparin  5,000 Units Subcutaneous Q8H   hydrALAZINE  10 mg Oral TID   labetalol  100 mg Oral BID   levothyroxine  50 mcg Oral QAC breakfast   losartan  100 mg Oral QHS   magnesium oxide  400 mg Oral Daily   memantine  5 mg Oral BID   mirtazapine  7.5 mg Oral QHS   multivitamin with minerals  1 tablet Oral Daily   Netarsudil Dimesylate  1 drop Both Eyes QHS   OLANZapine  7.5 mg Oral QHS   pantoprazole  40 mg Oral Daily   potassium chloride  20 mEq Oral Q M,W,F   senna-docusate  1 tablet Oral Daily    vitamin B-12  1,000 mcg Oral Daily    Continuous Infusions:  cefTRIAXone (ROCEPHIN)  IV       LOS: 0 days     Kayleen Memos, MD [facility] Pager [phone]  If 7PM-7AM, please contact night-coverage [email or web address] Password [email or web address] 05/29/2019, 10:50 AM

## 2019-05-30 MED ORDER — FUROSEMIDE 10 MG/ML IJ SOLN
40.0000 mg | Freq: Every day | INTRAMUSCULAR | Status: DC
  Administered 2019-05-30 – 2019-06-02 (×4): 40 mg via INTRAVENOUS
  Filled 2019-05-30 (×5): qty 4

## 2019-05-30 MED ORDER — IPRATROPIUM-ALBUTEROL 0.5-2.5 (3) MG/3ML IN SOLN
3.0000 mL | Freq: Two times a day (BID) | RESPIRATORY_TRACT | Status: DC
  Administered 2019-05-30 – 2019-05-31 (×2): 3 mL via RESPIRATORY_TRACT
  Filled 2019-05-30 (×2): qty 3

## 2019-05-30 NOTE — Plan of Care (Signed)
  Problem: Clinical Measurements: Goal: Ability to maintain clinical measurements within normal limits will improve Outcome: Progressing   Problem: Clinical Measurements: Goal: Will remain free from infection Outcome: Progressing   Problem: Clinical Measurements: Goal: Diagnostic test results will improve Outcome: Progressing   

## 2019-05-30 NOTE — Progress Notes (Signed)
PROGRESS NOTE  Margaret Li LEX:[id number] DOB: May 05, 1929 DOA: 05/28/2019 PCP: Reymundo Poll, MD  HPI/Recap of past 24 hours: Margaret Li  is a 85 y.o. female, with past medical history significant for hypertension, history of CVA, hypothyroidism, depression with anxiety, AAA, recent hospitalization last March secondary to diverticular GI bleed, patient presents to emergency department secondary to complaints of shortness of breath, patient is demented, but she is able to answer questions appropriately, reports shortness of breath for last 3 days, progressive, minimally ambulatory, worsened by laying supine, she denies fever, chills, cough, diarrhea, nausea or vomiting, S patient was hypoxic 88% on room air, currently she is on 3 L nasal cannula in ED, which I have stopped and she remains saturating 98%. - in ED her chest x-ray significant for opacity suspicious for community-acquired pneumonia, she was started on doxycycline and Rocephin, she was hypoxic by EMS 88% on room air, she was on 3 L nasal cannula, which I have stopped, she remained 98% on room air, she has lower extremity edema, elevated proBNP, and distended JVD as well, I was requested to admit for further evaluation, the 19 test was still pending at time of my evaluation.  05/30/19: Patient was seen and examined at her bedside this morning.  She is pleasantly demented and answers questions appropriately.  She reports chest congestion with some difficulty clearing her phlegm.  Denies chest pain or dyspnea at rest.  Not on oxygen supplementation at baseline.  Currently on 2 L of oxygen by nasal cannula.    Assessment/Plan: Active Problems:   Hypertension   Hypothyroidism   Vision loss, left eye   CKD (chronic kidney disease), stage II   History of CVA (cerebrovascular accident)   Dementia ([place])   CAP (community acquired pneumonia)  Multifocal community-acquired pneumonia Presented with shortness of breath and hypoxia Elevated  procalcitonin 1.50 on 05/29/2019 Independently reviewed CTA PE which showed bilateral pulmonary infiltrates and bilateral pleural effusions right greater than left. Continue Rocephin, p.o. doxycycline Continue to maintain O2 saturation greater than 92% Continue bronchodilators and antitussives Obtain home O2 evaluation  Acute hypoxic respiratory failure secondary to multifocal community-acquired pneumonia Management as stated above  Acute on chronic diastolic CHF Her volume status is improving  Presented with elevated BNP Last 2D echo done in January showed preserved LVEF 55% with grade 1 diastolic dysfunction IV Lasix 40 mg daily Continue strict I's and O's and daily weight Continue to monitor vital signs and renal function Net I&O -2.1 L since admission  Elevated D-dimers CTA PE independently reviewed showed no sign of pulmonary embolism Doppler ultrasound B/L lower extremity showed no sign of acute DVT  Essential hypertension Blood pressure is moderately elevated Continue p.o. hydralazine 10 mg 3 times daily, labetalol 100 mg twice daily, p.o. losartan 100 mg daily Add Lasix 40 mg IV daily  Hypothyroidism Continue levothyroxine  GERD Continue PPI  Dementia with no behavioral disturbance Continue home medications  History of CVA No acute issues Continue aspirin    DVT Prophylaxis Heparin subcu 3 times daily- SCDs  AM Labs Ordered, also please review Full Orders  Family Communication: Admission, patients condition and plan of care including tests being ordered have been discussed with the patient and OA is Christiane Ha Lloyd(cousin)  - [phone] (home) [phone]  who indicate understanding and agree with the plan and Code Status.  Code Status DNR  Likely [facility]  possibly tomorrow 05/31/2019 when oxygen requirement is close to baseline.  Consults called:  None    Objective: Vitals:   05/29/19 1512 05/29/19 2050 05/29/19 2129 05/30/19  0415  BP: 121/67 (!) 149/82  (!) 160/97  Pulse: 68 69 70 74  Resp: 16 15 16 16   Temp: 97.8 F (36.6 C) 98.2 F (36.8 C)  98.4 F (36.9 C)  TempSrc: Oral Oral  Oral  SpO2: 95% 98% 99% 100%  Weight:      Height:        Intake/Output Summary (Last 24 hours) at 05/30/2019 0824 Last data filed at 05/30/2019 4034 Gross per 24 hour  Intake 580 ml  Output 2300 ml  Net -1720 ml   Filed Weights   05/28/19 0925 05/29/19 0500  Weight: 60.8 kg 60.2 kg    Exam:  . General: 85 y.o. year-old female well-developed well-nourished in no acute distress.  Alert and pleasantly demented . Cardiovascular: Regular rate and rhythm no rubs or gallops.  No JVD or thyromegaly noted. Marland Kitchen Respiratory: Mild rales at bases.  No wheezes.  Poor inspiratory effort. . Abdomen: Soft nontender nondistended.  Bowel sounds present. . Musculoskeletal: 1+ pitting edema in lower extremities bilaterally.  2 out of 4 pulses in all 4 extremities. Marland Kitchen Psychiatry: Mood is appropriate for condition and setting.   Data Reviewed: CBC: Recent Labs  Lab 05/28/19 0923  WBC 7.5  NEUTROABS 6.1  HGB 11.4*  HCT 37.1  MCV 97.4  PLT 742   Basic Metabolic Panel: Recent Labs  Lab 05/28/19 0923 05/29/19 0625  NA 146* 143  K 3.4* 4.1  CL 108 104  CO2 29 29  GLUCOSE 97 108*  BUN 24* 24*  CREATININE 0.67 0.71  CALCIUM 9.2 9.2   GFR: Estimated Creatinine Clearance: 42 mL/min (by C-G formula based on SCr of 0.71 mg/dL). Liver Function Tests: Recent Labs  Lab 05/28/19 0923  AST 15  ALT 10  ALKPHOS 75  BILITOT 0.5  PROT 6.3*  ALBUMIN 3.2*   No results for input(s): LIPASE, AMYLASE in the last 168 hours. No results for input(s): AMMONIA in the last 168 hours. Coagulation Profile: No results for input(s): INR, PROTIME in the last 168 hours. Cardiac Enzymes: No results for input(s): CKTOTAL, CKMB, CKMBINDEX, TROPONINI in the last 168 hours. BNP (last 3 results) No results for input(s): PROBNP in the last 8760  hours. HbA1C: No results for input(s): HGBA1C in the last 72 hours. CBG: No results for input(s): GLUCAP in the last 168 hours. Lipid Profile: No results for input(s): CHOL, HDL, LDLCALC, TRIG, CHOLHDL, LDLDIRECT in the last 72 hours. Thyroid Function Tests: No results for input(s): TSH, T4TOTAL, FREET4, T3FREE, THYROIDAB in the last 72 hours. Anemia Panel: No results for input(s): VITAMINB12, FOLATE, FERRITIN, TIBC, IRON, RETICCTPCT in the last 72 hours. Urine analysis:    Component Value Date/Time   COLORURINE YELLOW 05/28/2019 1312   APPEARANCEUR CLOUDY (A) 05/28/2019 1312   LABSPEC 1.014 05/28/2019 1312   PHURINE 7.0 05/28/2019 1312   GLUCOSEU NEGATIVE 05/28/2019 1312   HGBUR NEGATIVE 05/28/2019 1312   BILIRUBINUR NEGATIVE 05/28/2019 1312   KETONESUR NEGATIVE 05/28/2019 1312   PROTEINUR NEGATIVE 05/28/2019 1312   UROBILINOGEN 1.0 02/16/2010 1215   NITRITE NEGATIVE 05/28/2019 1312   LEUKOCYTESUR NEGATIVE 05/28/2019 1312   Sepsis Labs: @LABRCNTIP (procalcitonin:4,lacticidven:4)  ) Recent Results (from the past 240 hour(s))  SARS Coronavirus 2 (CEPHEID- Performed in [place] hospital lab), Hosp Order     Status: None   Collection Time: 05/28/19 12:25 PM   Specimen: Nasopharyngeal Swab  Result Value Ref  Range Status   SARS Coronavirus 2 NEGATIVE NEGATIVE Final    Comment: (NOTE) If result is NEGATIVE SARS-CoV-2 target nucleic acids are NOT DETECTED. The SARS-CoV-2 RNA is generally detectable in upper and lower  respiratory specimens during the acute phase of infection. The lowest  concentration of SARS-CoV-2 viral copies this assay can detect is 250  copies / mL. A negative result does not preclude SARS-CoV-2 infection  and should not be used as the sole basis for treatment or other  patient management decisions.  A negative result may occur with  improper specimen collection / handling, submission of specimen other  than nasopharyngeal swab, presence of viral  mutation(s) within the  areas targeted by this assay, and inadequate number of viral copies  (<250 copies / mL). A negative result must be combined with clinical  observations, patient history, and epidemiological information. If result is POSITIVE SARS-CoV-2 target nucleic acids are DETECTED. The SARS-CoV-2 RNA is generally detectable in upper and lower  respiratory specimens dur ing the acute phase of infection.  Positive  results are indicative of active infection with SARS-CoV-2.  Clinical  correlation with patient history and other diagnostic information is  necessary to determine patient infection status.  Positive results do  not rule out bacterial infection or co-infection with other viruses. If result is PRESUMPTIVE POSTIVE SARS-CoV-2 nucleic acids MAY BE PRESENT.   A presumptive positive result was obtained on the submitted specimen  and confirmed on repeat testing.  While 2019 novel coronavirus  (SARS-CoV-2) nucleic acids may be present in the submitted sample  additional confirmatory testing may be necessary for epidemiological  and / or clinical management purposes  to differentiate between  SARS-CoV-2 and other Sarbecovirus currently known to infect humans.  If clinically indicated additional testing with an alternate test  methodology [phone]) is advised. The SARS-CoV-2 RNA is generally  detectable in upper and lower respiratory sp ecimens during the acute  phase of infection. The expected result is Negative. Fact Sheet for Patients:  StrictlyIdeas.no Fact Sheet for Healthcare Providers: BankingDealers.co.za This test is not yet approved or cleared by the [place] FDA and has been authorized for detection and/or diagnosis of SARS-CoV-2 by FDA under an Emergency Use Authorization (EUA).  This EUA will remain in effect (meaning this test can be used) for the duration of the COVID-19 declaration under Section 564(b)(1)  of the Act, 21 U.S.C. section 360bbb-3(b)(1), unless the authorization is terminated or revoked sooner. Performed at [facility], [place] [address] [place], [place] [address]   MRSA PCR Screening     Status: None   Collection Time: 05/28/19  9:03 PM   Specimen: Nasopharyngeal  Result Value Ref Range Status   MRSA by PCR NEGATIVE NEGATIVE Final    Comment:        The GeneXpert MRSA Assay (FDA approved for NASAL specimens only), is one component of a comprehensive MRSA colonization surveillance program. It is not intended to diagnose MRSA infection nor to guide or monitor treatment for MRSA infections. Performed at [facility], [place] [address], [place], [place] [address]       Studies: Vas [place] Lower Extremity Venous (dvt)  Result Date: 05/29/2019  Lower Venous Study Indications: Edema, and elevated d dimer.  Comparison Study: no prior Performing Technologist: Abram Sander RVS  Examination Guidelines: A complete evaluation includes B-mode imaging, spectral Doppler, color Doppler, and power Doppler as needed of all accessible portions of each vessel. Bilateral testing is considered an  integral part of a complete examination. Limited examinations for reoccurring indications may be performed as noted.  +---------+---------------+---------+-----------+----------+-------+ RIGHT    CompressibilityPhasicitySpontaneityPropertiesSummary +---------+---------------+---------+-----------+----------+-------+ CFV      Full           Yes      Yes                          +---------+---------------+---------+-----------+----------+-------+ SFJ      Full                                                 +---------+---------------+---------+-----------+----------+-------+ FV Prox  Full                                                 +---------+---------------+---------+-----------+----------+-------+ FV Mid   Full                                                  +---------+---------------+---------+-----------+----------+-------+ FV DistalFull                                                 +---------+---------------+---------+-----------+----------+-------+ PFV      Full                                                 +---------+---------------+---------+-----------+----------+-------+ POP      Full           Yes      Yes                          +---------+---------------+---------+-----------+----------+-------+ PTV      Full                                                 +---------+---------------+---------+-----------+----------+-------+ PERO     Full                                                 +---------+---------------+---------+-----------+----------+-------+   +---------+---------------+---------+-----------+----------+--------------+ LEFT     CompressibilityPhasicitySpontaneityPropertiesSummary        +---------+---------------+---------+-----------+----------+--------------+ CFV      Full           Yes      Yes                                 +---------+---------------+---------+-----------+----------+--------------+ SFJ      Full                                                        +---------+---------------+---------+-----------+----------+--------------+  FV Prox  Full                                                        +---------+---------------+---------+-----------+----------+--------------+ FV Mid   Full                                                        +---------+---------------+---------+-----------+----------+--------------+ FV DistalFull                                                        +---------+---------------+---------+-----------+----------+--------------+ PFV      Full                                                        +---------+---------------+---------+-----------+----------+--------------+ POP      Full           Yes      Yes                                  +---------+---------------+---------+-----------+----------+--------------+ PTV      Full                                                        +---------+---------------+---------+-----------+----------+--------------+ PERO                                                  Not visualized +---------+---------------+---------+-----------+----------+--------------+     Summary: Right: There is no evidence of deep vein thrombosis in the lower extremity. No cystic structure found in the popliteal fossa. Left: There is no evidence of deep vein thrombosis in the lower extremity. No cystic structure found in the popliteal fossa.  *See table(s) above for measurements and observations. Electronically signed by Monica Martinez MD on 05/29/2019 at 5:40:38 PM.    Final     Scheduled Meds: . aspirin EC  81 mg Oral Daily  . [START ON 06/01/2019] bisacodyl  10 mg Rectal Q Sun  . busPIRone  2.5 mg Oral BID  . calcium carbonate  1,000 mg Oral QHS  . dextromethorphan-guaiFENesin  2 tablet Oral BID  . dorzolamide-timolol  1 drop Both Eyes BID  . doxycycline  100 mg Oral Q12H  . feeding supplement (ENSURE ENLIVE)  237 mL Oral BID  . fluticasone  1 spray Each Nare Daily  . furosemide  40 mg Intravenous Daily  . heparin  5,000 Units Subcutaneous Q8H  . hydrALAZINE  10 mg Oral TID  .  ipratropium-albuterol  3 mL Inhalation TID  . labetalol  100 mg Oral BID  . levothyroxine  50 mcg Oral QAC breakfast  . losartan  100 mg Oral QHS  . magnesium oxide  400 mg Oral Daily  . memantine  5 mg Oral BID  . mirtazapine  7.5 mg Oral QHS  . multivitamin with minerals  1 tablet Oral Daily  . Netarsudil Dimesylate  1 drop Both Eyes QHS  . OLANZapine  7.5 mg Oral QHS  . pantoprazole  40 mg Oral Daily  . potassium chloride  20 mEq Oral Q M,W,F  . senna-docusate  1 tablet Oral Daily  . vitamin B-12  1,000 mcg Oral Daily    Continuous Infusions: . cefTRIAXone (ROCEPHIN)  IV 1 g  (05/29/19 1320)     LOS: 1 day     Kayleen Memos, MD [facility] Pager [phone]  If 7PM-7AM, please contact night-coverage [email or web address] Password [facility] 05/30/2019, 8:24 AM

## 2019-05-30 NOTE — Consult Note (Signed)
   [facility] CM Inpatient Consult   05/30/2019  [name] [birth year] [id number]     Patient was screened for possible needs of [facility] Care Management services with her 31% extreme high risk score for readmissions and hospitalizations, under her [facility] insurance plan.  She was engaged by [facility] care management coordinators in distant past.  Our community based plan of care has focused on disease management and community resource support. Patient lives at [facility] (assisted living facility).  Patient is not currently a beneficiary of the attributed [place] in the [employer].  Reason: Her current primary care provider is Dr. Reymundo Poll with [facility], who is NOT a [facility] provider and not affiliated with [employer]  This patient is currently Not eligible for [facility] Care Management Services. Membership roster used to verify non-eligible status.    Will sign off.   Alyssa Rotondo A. Delaina Fetsch, BSN, RN-BC [facility] Liaison Cell: [phone]

## 2019-05-31 LAB — BASIC METABOLIC PANEL
Anion gap: 8 (ref 5–15)
BUN: 23 mg/dL (ref 8–23)
CO2: 32 mmol/L (ref 22–32)
Calcium: 9.2 mg/dL (ref 8.9–10.3)
Chloride: 101 mmol/L (ref 98–111)
Creatinine, Ser: 0.61 mg/dL (ref 0.44–1.00)
GFR calc Af Amer: >60 mL/min (ref >60)
GFR calc non Af Amer: >60 mL/min (ref >60)
Glucose, Bld: 89 mg/dL (ref 70–99)
Potassium: 3.8 mmol/L (ref 3.5–5.1)
Sodium: 141 mmol/L (ref 135–145)

## 2019-05-31 MED ORDER — HYDRALAZINE HCL 25 MG PO TABS
25.0000 mg | ORAL_TABLET | Freq: Three times a day (TID) | ORAL | Status: DC
  Administered 2019-05-31 – 2019-06-02 (×5): 25 mg via ORAL
  Filled 2019-05-31 (×5): qty 1

## 2019-05-31 MED ORDER — HYDRALAZINE HCL 25 MG PO TABS: 25.0000 mg | ORAL_TABLET | Freq: Three times a day (TID) | ORAL | Status: DC

## 2019-05-31 MED ORDER — ALBUTEROL SULFATE (2.5 MG/3ML) 0.083% IN NEBU: 2.5000 mg | INHALATION_SOLUTION | RESPIRATORY_TRACT | Status: DC | PRN

## 2019-05-31 NOTE — Progress Notes (Signed)
PROGRESS NOTE  Margaret Li WFU:[id number] DOB: 08/25/1929 DOA: 05/28/2019 PCP: Reymundo Poll, MD  HPI/Recap of past 24 hours: Margaret Li  is a 85 y.o. female, with past medical history significant for hypertension, history of CVA, hypothyroidism, depression with anxiety, AAA, recent hospitalization last March secondary to diverticular GI bleed, patient presents to emergency department secondary to complaints of shortness of breath, patient is demented, but she is able to answer questions appropriately, reports shortness of breath for last 3 days, progressive, minimally ambulatory, worsened by laying supine, she denies fever, chills, cough, diarrhea, nausea or vomiting, S patient was hypoxic 88% on room air, currently she is on 3 L nasal cannula in ED, which I have stopped and she remains saturating 98%. - in ED her chest x-ray significant for opacity suspicious for community-acquired pneumonia, she was started on doxycycline and Rocephin, she was hypoxic by EMS 88% on room air, she was on 3 L nasal cannula, which I have stopped, she remained 98% on room air, she has lower extremity edema, elevated proBNP, and distended JVD as well, I was requested to admit for further evaluation, the 19 test was still pending at time of my evaluation.  05/31/19: Patient was seen and examined at her bedside.  Patient feels better today, her chest congestion is improved.   Assessment/Plan: Active Problems:   Hypertension   Hypothyroidism   Vision loss, left eye   CKD (chronic kidney disease), stage II   History of CVA (cerebrovascular accident)   Dementia ([place])   CAP (community acquired pneumonia)  Multifocal community-acquired pneumonia Presented with shortness of breath and hypoxia Elevated procalcitonin 1.50 on 05/29/2019 Independently reviewed CTA PE which showed bilateral pulmonary infiltrates and bilateral pleural effusions right greater than left. C/w Rocephin, p.o. doxycycline Continue to maintain O2  saturation greater than 92% Continue bronchodilators and antitussives  Acute hypoxic respiratory failure secondary to multifocal community-acquired pneumonia Management as stated above Home O2 evaluation prior to DC.  Uncontrolled hypertension Blood pressure not at goal Increased po Hydralazine to 25 mg daily Continue labetalol 100 mg twice daily, p.o. losartan 100 mg daily and Lasix 40 mg IV daily  Acute on chronic diastolic CHF Presented with elevated BNP Her Volume Status Is Improving  Net I&O -3.3 mL Last 2D echo done in January showed preserved LVEF 55% with grade 1 diastolic dysfunction Continue IV Lasix 40 mg daily Continue strict I's and O's and daily weight Continue to monitor vital signs and renal function  Elevated D-dimers CTA PE independently reviewed showed no sign of pulmonary embolism Doppler ultrasound B/L lower extremity showed no sign of acute DVT  Hypothyroidism Continue levothyroxine  GERD Continue PPI  Dementia with no behavioral disturbance Continue home medications  History of CVA No acute issues Continue aspirin    DVT Prophylaxis Heparin subcu 3 times daily- SCDs  AM Labs Ordered, also please review Full Orders  Family Communication: Admission, patients condition and plan of care including tests being ordered have been discussed with the patient and OA is Christiane Ha Lloyd(cousin)  - [phone] (home) [phone]  who indicate understanding and agree with the plan and Code Status.  Code Status DNR  Likely [facility]  possibly tomorrow 06/01/2019 when oxygen requirement is close to baseline.  Consults called: None    Objective: Vitals:   05/30/19 2241 05/31/19 0537 05/31/19 0717 05/31/19 0957  BP: (!) 148/92 (!) 171/87  (!) 191/93  Pulse: 73 65  70  Resp:  16  14  Temp:  98.5 F (36.9 C)  98.1 F (36.7 C)  TempSrc:  Oral  Oral  SpO2:  100% 96% 98%  Weight:      Height:        Intake/Output Summary (Last 24  hours) at 05/31/2019 1251 Last data filed at 05/31/2019 6389 Gross per 24 hour  Intake 290 ml  Output 2250 ml  Net -1960 ml   Filed Weights   05/28/19 0925 05/29/19 0500  Weight: 60.8 kg 60.2 kg    Exam:  . General: 85 y.o. year-old female well-developed well-nourished.  No acute distress.  Pleasantly demented.   . Cardiovascular: Regular rate and rhythm no rubs or gallops.  No JVD or thyromegaly. Marland Kitchen Respiratory: Mild rales at bases.  No wheezes noted.  Good respiratory effort. . Abdomen: Soft nontender nondistended normal bowel sounds present. . Musculoskeletal: Trace lower extremity edema.  2 out of 4 pulses in all 4 extremities. Marland Kitchen Psychiatry: Mood is appropriate for condition and setting.   Data Reviewed: CBC: Recent Labs  Lab 05/28/19 0923  WBC 7.5  NEUTROABS 6.1  HGB 11.4*  HCT 37.1  MCV 97.4  PLT 373   Basic Metabolic Panel: Recent Labs  Lab 05/28/19 0923 05/29/19 0625 05/31/19 0800  NA 146* 143 141  K 3.4* 4.1 3.8  CL 108 104 101  CO2 29 29 32  GLUCOSE 97 108* 89  BUN 24* 24* 23  CREATININE 0.67 0.71 0.61  CALCIUM 9.2 9.2 9.2   GFR: Estimated Creatinine Clearance: 42 mL/min (by C-G formula based on SCr of 0.61 mg/dL). Liver Function Tests: Recent Labs  Lab 05/28/19 0923  AST 15  ALT 10  ALKPHOS 75  BILITOT 0.5  PROT 6.3*  ALBUMIN 3.2*   No results for input(s): LIPASE, AMYLASE in the last 168 hours. No results for input(s): AMMONIA in the last 168 hours. Coagulation Profile: No results for input(s): INR, PROTIME in the last 168 hours. Cardiac Enzymes: No results for input(s): CKTOTAL, CKMB, CKMBINDEX, TROPONINI in the last 168 hours. BNP (last 3 results) No results for input(s): PROBNP in the last 8760 hours. HbA1C: No results for input(s): HGBA1C in the last 72 hours. CBG: No results for input(s): GLUCAP in the last 168 hours. Lipid Profile: No results for input(s): CHOL, HDL, LDLCALC, TRIG, CHOLHDL, LDLDIRECT in the last 72 hours.  Thyroid Function Tests: No results for input(s): TSH, T4TOTAL, FREET4, T3FREE, THYROIDAB in the last 72 hours. Anemia Panel: No results for input(s): VITAMINB12, FOLATE, FERRITIN, TIBC, IRON, RETICCTPCT in the last 72 hours. Urine analysis:    Component Value Date/Time   COLORURINE YELLOW 05/28/2019 1312   APPEARANCEUR CLOUDY (A) 05/28/2019 1312   LABSPEC 1.014 05/28/2019 1312   PHURINE 7.0 05/28/2019 1312   GLUCOSEU NEGATIVE 05/28/2019 1312   HGBUR NEGATIVE 05/28/2019 1312   BILIRUBINUR NEGATIVE 05/28/2019 1312   KETONESUR NEGATIVE 05/28/2019 1312   PROTEINUR NEGATIVE 05/28/2019 1312   UROBILINOGEN 1.0 02/16/2010 1215   NITRITE NEGATIVE 05/28/2019 1312   LEUKOCYTESUR NEGATIVE 05/28/2019 1312   Sepsis Labs: @LABRCNTIP (procalcitonin:4,lacticidven:4)  ) Recent Results (from the past 240 hour(s))  SARS Coronavirus 2 (CEPHEID- Performed in [place] hospital lab), Hosp Order     Status: None   Collection Time: 05/28/19 12:25 PM   Specimen: Nasopharyngeal Swab  Result Value Ref Range Status   SARS Coronavirus 2 NEGATIVE NEGATIVE Final    Comment: (NOTE) If result is NEGATIVE SARS-CoV-2 target nucleic acids are NOT DETECTED. The SARS-CoV-2 RNA is generally detectable in  upper and lower  respiratory specimens during the acute phase of infection. The lowest  concentration of SARS-CoV-2 viral copies this assay can detect is 250  copies / mL. A negative result does not preclude SARS-CoV-2 infection  and should not be used as the sole basis for treatment or other  patient management decisions.  A negative result may occur with  improper specimen collection / handling, submission of specimen other  than nasopharyngeal swab, presence of viral mutation(s) within the  areas targeted by this assay, and inadequate number of viral copies  (<250 copies / mL). A negative result must be combined with clinical  observations, patient history, and epidemiological information. If result is  POSITIVE SARS-CoV-2 target nucleic acids are DETECTED. The SARS-CoV-2 RNA is generally detectable in upper and lower  respiratory specimens dur ing the acute phase of infection.  Positive  results are indicative of active infection with SARS-CoV-2.  Clinical  correlation with patient history and other diagnostic information is  necessary to determine patient infection status.  Positive results do  not rule out bacterial infection or co-infection with other viruses. If result is PRESUMPTIVE POSTIVE SARS-CoV-2 nucleic acids MAY BE PRESENT.   A presumptive positive result was obtained on the submitted specimen  and confirmed on repeat testing.  While 2019 novel coronavirus  (SARS-CoV-2) nucleic acids may be present in the submitted sample  additional confirmatory testing may be necessary for epidemiological  and / or clinical management purposes  to differentiate between  SARS-CoV-2 and other Sarbecovirus currently known to infect humans.  If clinically indicated additional testing with an alternate test  methodology [phone]) is advised. The SARS-CoV-2 RNA is generally  detectable in upper and lower respiratory sp ecimens during the acute  phase of infection. The expected result is Negative. Fact Sheet for Patients:  StrictlyIdeas.no Fact Sheet for Healthcare Providers: BankingDealers.co.za This test is not yet approved or cleared by the [place] FDA and has been authorized for detection and/or diagnosis of SARS-CoV-2 by FDA under an Emergency Use Authorization (EUA).  This EUA will remain in effect (meaning this test can be used) for the duration of the COVID-19 declaration under Section 564(b)(1) of the Act, 21 U.S.C. section 360bbb-3(b)(1), unless the authorization is terminated or revoked sooner. Performed at [facility], [place] [address], [place], [place] [address]   MRSA PCR Screening     Status: None   Collection Time:  05/28/19  9:03 PM   Specimen: Nasopharyngeal  Result Value Ref Range Status   MRSA by PCR NEGATIVE NEGATIVE Final    Comment:        The GeneXpert MRSA Assay (FDA approved for NASAL specimens only), is one component of a comprehensive MRSA colonization surveillance program. It is not intended to diagnose MRSA infection nor to guide or monitor treatment for MRSA infections. Performed at [facility], [place] [address], [place], [name] [address]       Studies: No results found.  Scheduled Meds: . aspirin EC  81 mg Oral Daily  . [START ON 06/01/2019] bisacodyl  10 mg Rectal Q Sun  . busPIRone  2.5 mg Oral BID  . calcium carbonate  1,000 mg Oral QHS  . dextromethorphan-guaiFENesin  2 tablet Oral BID  . dorzolamide-timolol  1 drop Both Eyes BID  . doxycycline  100 mg Oral Q12H  . feeding supplement (ENSURE ENLIVE)  237 mL Oral BID  . fluticasone  1 spray Each Nare Daily  . furosemide  40  mg Intravenous Daily  . heparin  5,000 Units Subcutaneous Q8H  . hydrALAZINE  25 mg Oral TID  . labetalol  100 mg Oral BID  . levothyroxine  50 mcg Oral QAC breakfast  . losartan  100 mg Oral QHS  . magnesium oxide  400 mg Oral Daily  . memantine  5 mg Oral BID  . mirtazapine  7.5 mg Oral QHS  . multivitamin with minerals  1 tablet Oral Daily  . OLANZapine  7.5 mg Oral QHS  . pantoprazole  40 mg Oral Daily  . potassium chloride  20 mEq Oral Q M,W,F  . senna-docusate  1 tablet Oral Daily  . vitamin B-12  1,000 mcg Oral Daily    Continuous Infusions: . cefTRIAXone (ROCEPHIN)  IV 1 g (05/31/19 1235)     LOS: 2 days     Kayleen Memos, MD [facility] Pager [phone]  If 7PM-7AM, please contact night-coverage [email or web address] Password [facility] 05/31/2019, 12:51 PM

## 2019-05-31 NOTE — Plan of Care (Signed)
  Problem: Nutrition: Goal: Adequate nutrition will be maintained Outcome: Progressing   Problem: Coping: Goal: Level of anxiety will decrease Outcome: Progressing   Problem: Pain Managment: Goal: General experience of comfort will improve Outcome: Progressing   

## 2019-05-31 NOTE — Evaluation (Signed)
Physical Therapy Evaluation Patient Details Name: Margaret Li MRN: [id number] DOB: Aug 23, 1929 Today's Date: 05/31/2019   History of Present Illness  Margaret Li  is a 85 y.o. female, with past medical history significant for hypertension, history of CVA, dementia, hypothyroidism, depression with anxiety, AAA, recent hospitalization last March secondary to diverticular GI bleed, patient presented to emergency department secondary to complaints of shortness of breath and diagnosed with pneumonia.    Clinical Impression  Pt admitted with above diagnosis. Pt currently with functional limitations due to the deficits listed below (see PT Problem List). Pt will benefit from skilled PT to increase their independence and safety with mobility to allow discharge back to ALF if they can manage her at her current functional level. Her baseline mobility is not known at this time.     Follow Up Recommendations Home health PT;Supervision/Assistance - 24 hour;Other (comment)(return to ALF if ALF can care for her at her current level)    Equipment Recommendations  None recommended by PT    Recommendations for Other Services       Precautions / Restrictions Precautions Precautions: Fall Restrictions Weight Bearing Restrictions: No      Mobility  Bed Mobility Overal bed mobility: Needs Assistance Bed Mobility: Supine to Sit     Supine to sit: Min assist        Transfers Overall transfer level: Needs assistance Equipment used: Rolling walker (2 wheeled) Transfers: Sit to/from Stand Sit to Stand: Mod assist         General transfer comment: assist needed to power up  Ambulation/Gait Ambulation/Gait assistance: Min assist Gait Distance (Feet): 4 Feet Assistive device: Rolling walker (2 wheeled) Gait Pattern/deviations: Step-to pattern;Decreased stride length;Trunk flexed Gait velocity: very slow   General Gait Details: Gait very slow. Needed assist to maintain balance in  standing  Stairs            Wheelchair Mobility    Modified Rankin (Stroke Patients Only)       Balance Overall balance assessment: Needs assistance                                           Pertinent Vitals/Pain Pain Assessment: No/denies pain    Home Living Family/patient expects to be discharged to:: Assisted living               Home Equipment: Walker - 4 wheels Additional Comments: (Pt states she still lives at [facility])    Prior Function Level of Independence: Needs assistance   Gait / Transfers Assistance Needed: (Unsure what level pt was at PTA)           Hand Dominance        Extremity/Trunk Assessment   Upper Extremity Assessment Upper Extremity Assessment: Overall WFL for tasks assessed    Lower Extremity Assessment Lower Extremity Assessment: Generalized weakness(functional weakness with gait)    Cervical / Trunk Assessment Cervical / Trunk Assessment: Kyphotic  Communication   Communication: No difficulties  Cognition Arousal/Alertness: Awake/alert Behavior During Therapy: WFL for tasks assessed/performed Overall Cognitive Status: No family/caregiver present to determine baseline cognitive functioning(history of dementia)                                        General Comments General comments (skin integrity, edema,  etc.): No family present. Prior level of function unclear    Exercises     Assessment/Plan    PT Assessment Patient needs continued PT services  PT Problem List Decreased strength;Decreased activity tolerance;Decreased balance;Decreased mobility;Decreased cognition       PT Treatment Interventions Gait training;DME instruction;Therapeutic activities;Therapeutic exercise;Balance training;Patient/family education    PT Goals (Current goals can be found in the Care Plan section)  Acute Rehab PT Goals Patient Stated Goal: pt agreed to walk  PT Goal Formulation: Patient  unable to participate in goal setting Time For Goal Achievement: 06/07/19 Potential to Achieve Goals: Good    Frequency Min 3X/week   Barriers to discharge        Co-evaluation               AM-PAC PT "6 Clicks" Mobility  Outcome Measure Help needed turning from your back to your side while in a flat bed without using bedrails?: A Little Help needed moving from lying on your back to sitting on the side of a flat bed without using bedrails?: A Little Help needed moving to and from a bed to a chair (including a wheelchair)?: A Lot Help needed standing up from a chair using your arms (e.g., wheelchair or bedside chair)?: A Lot Help needed to walk in hospital room?: A Lot Help needed climbing 3-5 steps with a railing? : A Lot 6 Click Score: 14    End of Session Equipment Utilized During Treatment: Gait belt;Oxygen Activity Tolerance: Patient tolerated treatment well Patient left: in chair;with chair alarm set;with call bell/phone within reach Nurse Communication: Mobility status PT Visit Diagnosis: Unsteadiness on feet (R26.81)    Time: 8315-1761 PT Time Calculation (min) (ACUTE ONLY): 22 min   Charges:   PT Evaluation $PT Eval Moderate Complexity: [place], PT   Acute Rehabilitation Services  Pager [phone] Office [phone] 05/31/2019   Melvern Banker 05/31/2019, 1:20 PM

## 2019-06-01 LAB — BASIC METABOLIC PANEL
Anion gap: 8 (ref 5–15)
BUN: 27 mg/dL — ABNORMAL HIGH (ref 8–23)
CO2: 31 mmol/L (ref 22–32)
Calcium: 9 mg/dL (ref 8.9–10.3)
Chloride: 100 mmol/L (ref 98–111)
Creatinine, Ser: 0.7 mg/dL (ref 0.44–1.00)
GFR calc Af Amer: >60 mL/min (ref >60)
GFR calc non Af Amer: >60 mL/min (ref >60)
Glucose, Bld: 105 mg/dL — ABNORMAL HIGH (ref 70–99)
Potassium: 3.6 mmol/L (ref 3.5–5.1)
Sodium: 139 mmol/L (ref 135–145)

## 2019-06-01 MED ORDER — SENNOSIDES-DOCUSATE SODIUM 8.6-50 MG PO TABS
2.0000 | ORAL_TABLET | Freq: Two times a day (BID) | ORAL | Status: DC
  Administered 2019-06-01 – 2019-06-02 (×2): 2 via ORAL
  Filled 2019-06-01 (×2): qty 2

## 2019-06-01 MED ORDER — POLYETHYLENE GLYCOL 3350 17 G PO PACK
17.0000 g | PACK | Freq: Every day | ORAL | Status: DC
  Administered 2019-06-02: 17 g via ORAL
  Filled 2019-06-01: qty 1

## 2019-06-01 NOTE — Plan of Care (Signed)
  Problem: Education: Goal: Knowledge of General Education information will improve Description Including pain rating scale, medication(s)/side effects and non-pharmacologic comfort measures Outcome: Progressing   

## 2019-06-01 NOTE — Plan of Care (Signed)
  Problem: Clinical Measurements: Goal: Ability to maintain clinical measurements within normal limits will improve Outcome: Progressing Goal: Will remain free from infection Outcome: Progressing Goal: Diagnostic test results will improve Outcome: Progressing Goal: Respiratory complications will improve Outcome: Progressing Goal: Cardiovascular complication will be avoided Outcome: Progressing   Problem: Coping: Goal: Level of anxiety will decrease Outcome: Progressing   Problem: Elimination: Goal: Will not experience complications related to bowel motility Outcome: Progressing   Problem: Pain Managment: Goal: General experience of comfort will improve Outcome: Progressing   Problem: Safety: Goal: Ability to remain free from injury will improve Outcome: Progressing   Problem: Skin Integrity: Goal: Risk for impaired skin integrity will decrease Outcome: Progressing   

## 2019-06-01 NOTE — Progress Notes (Signed)
PROGRESS NOTE  Margaret Li BPZ:[id number] DOB: 08/26/1929 DOA: 05/28/2019 PCP: Reymundo Poll, MD  HPI/Recap of past 24 hours: Margaret Li  is a 85 y.o. female, with past medical history significant for hypertension, history of CVA, hypothyroidism, depression with anxiety, AAA, recent hospitalization last March secondary to diverticular GI bleed, patient presents to emergency department secondary to complaints of shortness of breath, patient is demented, but she is able to answer questions appropriately, reports shortness of breath for last 3 days, progressive, minimally ambulatory, worsened by laying supine, she denies fever, chills, cough, diarrhea, nausea or vomiting, S patient was hypoxic 88% on room air, currently she is on 3 L nasal cannula in ED, which I have stopped and she remains saturating 98%. - in ED her chest x-ray significant for opacity suspicious for community-acquired pneumonia, she was started on doxycycline and Rocephin, she was hypoxic by EMS 88% on room air, she was on 3 L nasal cannula, which I have stopped, she remained 98% on room air, she has lower extremity edema, elevated proBNP, and distended JVD as well, I was requested to admit for further evaluation, the 19 test was still pending at time of my evaluation.  06/01/19: Patient was seen and examined at the bedside.  Reports feeling constipated.  Breathing and cough have improved on IV antibiotics and diuretics.  Denies chest pain or palpitations.     Assessment/Plan: Active Problems:   Hypertension   Hypothyroidism   Vision loss, left eye   CKD (chronic kidney disease), stage II   History of CVA (cerebrovascular accident)   Dementia ([place])   CAP (community acquired pneumonia)  Multifocal community-acquired pneumonia Presented with shortness of breath and hypoxia Elevated procalcitonin 1.50 on 05/29/2019 Independently reviewed CTA PE which showed bilateral pulmonary infiltrates and bilateral pleural effusions right  greater than left. procalcitonin 1.50 on 05/29/19 C/w Rocephin, p.o. doxycycline day #4/5 Continue to maintain O2 saturation greater than 92% Continue bronchodilators and antitussives Afebrile, no leukocytosis  Acute hypoxic respiratory failure secondary to multifocal community-acquired pneumonia Management as stated above Home O2 evaluation prior to DC.  Chronic constipation Bowel regimen with senokot 2 tabs BID Added daily Miralax C/w dulcolax 10 mg suppository every Sunday  Physical debilty/ambulatory dysfunction PT rec Home health PT with 24h supervision Face to face in place Fall precautions  Uncontrolled hypertension Blood pressure not at goal Increased po Hydralazine to 25 mg daily Continue labetalol 100 mg twice daily, p.o. losartan 100 mg daily and Lasix 40 mg IV daily  Acute on chronic diastolic CHF Presented with elevated BNP Her Volume Status Is Improving  Net I&O -3.3 mL Last 2D echo done in January showed preserved LVEF 55% with grade 1 diastolic dysfunction Continue IV Lasix 40 mg daily Continue strict I's and O's and daily weight Continue to monitor vital signs and renal function  Elevated D-dimers CTA PE independently reviewed showed no sign of pulmonary embolism Doppler ultrasound B/L lower extremity showed no sign of acute DVT  Hypothyroidism Continue levothyroxine  GERD Continue PPI  Dementia with no behavioral disturbance Continue home medications  History of CVA No acute issues Continue aspirin    DVT Prophylaxis Heparin subcu 3 times daily- SCDs  AM Labs Ordered, also please review Full Orders  Family Communication: Admission, patients condition and plan of care including tests being ordered have been discussed with the patient and OA is Christiane Ha Lloyd(cousin)  - [phone] (home) [phone]  who indicate understanding and agree with the plan and Code Status.  Code Status DNR  [facility]ely [facility]  possibly  tomorrow 06/01/2019 when oxygen requirement is close to baseline.  Consults called: None    Objective: Vitals:   05/31/19 1738 05/31/19 2209 06/01/19 0500 06/01/19 0855  BP: 120/77 137/88 132/84 140/83  Pulse: 67 66 68 66  Resp: 14 18 16 14   Temp: 97.8 F (36.6 C) 98.1 F (36.7 C) 98.2 F (36.8 C) 97.6 F (36.4 C)  TempSrc: Oral Oral Oral Oral  SpO2: 97% 97% 100% 99%  Weight:   61.7 kg   Height:        Intake/Output Summary (Last 24 hours) at 06/01/2019 1109 Last data filed at 06/01/2019 0900 Gross per 24 hour  Intake 114 ml  Output 200 ml  Net -86 ml   Filed Weights   05/28/19 0925 05/29/19 0500 06/01/19 0500  Weight: 60.8 kg 60.2 kg 61.7 kg    Exam:  . General: 85 y.o. year-old female well-developed well-nourished no acute distress.  Alert in the setting of dementia.   . Cardiovascular: Regular rate and rhythm.  No rubs or gallops.   Marland Kitchen  Respiratory: Clear to auscultation no wheezes or rales.  Good respiratory effort. . Abdomen: Soft nontender nondistended normal bowel sounds present. . Musculoskeletal: No lower extremity edema.  2 out of 4 pulses in all 4 extremities.   Marland Kitchen Psychiatry: Pleasantly demented.  Mood is appropriate.   Data Reviewed: CBC: Recent Labs  Lab 05/28/19 0923  WBC 7.5  NEUTROABS 6.1  HGB 11.4*  HCT 37.1  MCV 97.4  PLT 956   Basic Metabolic Panel: Recent Labs  Lab 05/28/19 0923 05/29/19 0625 05/31/19 0800 06/01/19 0431  NA 146* 143 141 139  K 3.4* 4.1 3.8 3.6  CL 108 104 101 100  CO2 29 29 32 31  GLUCOSE 97 108* 89 105*  BUN 24* 24* 23 27*  CREATININE 0.67 0.71 0.61 0.70  CALCIUM 9.2 9.2 9.2 9.0   GFR: Estimated Creatinine Clearance: 42 mL/min (by C-G formula based on SCr of 0.7 mg/dL). Liver Function Tests: Recent Labs  Lab 05/28/19 0923  AST 15  ALT 10  ALKPHOS 75  BILITOT 0.5  PROT 6.3*  ALBUMIN 3.2*   No results for input(s): LIPASE, AMYLASE in the last 168 hours. No results for input(s): AMMONIA in the last  168 hours. Coagulation Profile: No results for input(s): INR, PROTIME in the last 168 hours. Cardiac Enzymes: No results for input(s): CKTOTAL, CKMB, CKMBINDEX, TROPONINI in the last 168 hours. BNP (last 3 results) No results for input(s): PROBNP in the last 8760 hours. HbA1C: No results for input(s): HGBA1C in the last 72 hours. CBG: No results for input(s): GLUCAP in the last 168 hours. Lipid Profile: No results for input(s): CHOL, HDL, LDLCALC, TRIG, CHOLHDL, LDLDIRECT in the last 72 hours. Thyroid Function Tests: No results for input(s): TSH, T4TOTAL, FREET4, T3FREE, THYROIDAB in the last 72 hours. Anemia Panel: No results for input(s): VITAMINB12, FOLATE, FERRITIN, TIBC, IRON, RETICCTPCT in the last 72 hours. Urine analysis:    Component Value Date/Time   COLORURINE YELLOW 05/28/2019 1312   APPEARANCEUR CLOUDY (A) 05/28/2019 1312   LABSPEC 1.014 05/28/2019 1312   PHURINE 7.0 05/28/2019 1312   [place] 05/28/2019 1312   HGBUR NEGATIVE 05/28/2019 1312   BILIRUBINUR NEGATIVE 05/28/2019 1312   KETONESUR NEGATIVE 05/28/2019 1312   PROTEINUR NEGATIVE 05/28/2019 1312   UROBILINOGEN 1.0 02/16/2010 1215   NITRITE NEGATIVE 05/28/2019 1312   LEUKOCYTESUR NEGATIVE 05/28/2019 1312  Sepsis Labs: @LABRCNTIP (procalcitonin:4,lacticidven:4)  ) Recent Results (from the past 240 hour(s))  SARS Coronavirus 2 (CEPHEID- Performed in [facility] hospital lab), Hosp Order     Status: None   Collection Time: 05/28/19 12:25 PM   Specimen: Nasopharyngeal Swab  Result Value Ref Range Status   SARS Coronavirus 2 NEGATIVE NEGATIVE Final    Comment: (NOTE) If result is NEGATIVE SARS-CoV-2 target nucleic acids are NOT DETECTED. The SARS-CoV-2 RNA is generally detectable in upper and lower  respiratory specimens during the acute phase of infection. The lowest  concentration of SARS-CoV-2 viral copies this assay can detect is 250  copies / mL. A negative result does not preclude  SARS-CoV-2 infection  and should not be used as the sole basis for treatment or other  patient management decisions.  A negative result may occur with  improper specimen collection / handling, submission of specimen other  than nasopharyngeal swab, presence of viral mutation(s) within the  areas targeted by this assay, and inadequate number of viral copies  (<250 copies / mL). A negative result must be combined with clinical  observations, patient history, and epidemiological information. If result is POSITIVE SARS-CoV-2 target nucleic acids are DETECTED. The SARS-CoV-2 RNA is generally detectable in upper and lower  respiratory specimens dur ing the acute phase of infection.  Positive  results are indicative of active infection with SARS-CoV-2.  Clinical  correlation with patient history and other diagnostic information is  necessary to determine patient infection status.  Positive results do  not rule out bacterial infection or co-infection with other viruses. If result is PRESUMPTIVE POSTIVE SARS-CoV-2 nucleic acids MAY BE PRESENT.   A presumptive positive result was obtained on the submitted specimen  and confirmed on repeat testing.  While 2019 novel coronavirus  (SARS-CoV-2) nucleic acids may be present in the submitted sample  additional confirmatory testing may be necessary for epidemiological  and / or clinical management purposes  to differentiate between  SARS-CoV-2 and other Sarbecovirus currently known to infect humans.  If clinically indicated additional testing with an alternate test  methodology [phone]) is advised. The SARS-CoV-2 RNA is generally  detectable in upper and lower respiratory sp ecimens during the acute  phase of infection. The expected result is Negative. Fact Sheet for Patients:  StrictlyIdeas.no Fact Sheet for Healthcare Providers: BankingDealers.co.za This test is not yet approved or cleared by the  [place] FDA and has been authorized for detection and/or diagnosis of SARS-CoV-2 by FDA under an Emergency Use Authorization (EUA).  This EUA will remain in effect (meaning this test can be used) for the duration of the COVID-19 declaration under Section 564(b)(1) of the Act, 21 U.S.C. section 360bbb-3(b)(1), unless the authorization is terminated or revoked sooner. Performed at [facility], [place] [address] [place], [place] [address]   MRSA PCR Screening     Status: None   Collection Time: 05/28/19  9:03 PM   Specimen: Nasopharyngeal  Result Value Ref Range Status   MRSA by PCR NEGATIVE NEGATIVE Final    Comment:        The GeneXpert MRSA Assay (FDA approved for NASAL specimens only), is one component of a comprehensive MRSA colonization surveillance program. It is not intended to diagnose MRSA infection nor to guide or monitor treatment for MRSA infections. Performed at [facility], [place] [address] [place], [place] [address]       Studies: No results found.  Scheduled Meds: . aspirin EC  81 mg Oral  Daily  . bisacodyl  10 mg Rectal Q Sun  . busPIRone  2.5 mg Oral BID  . calcium carbonate  1,000 mg Oral QHS  . dextromethorphan-guaiFENesin  2 tablet Oral BID  . dorzolamide-timolol  1 drop Both Eyes BID  . doxycycline  100 mg Oral Q12H  . feeding supplement (ENSURE ENLIVE)  237 mL Oral BID  . fluticasone  1 spray Each Nare Daily  . furosemide  40 mg Intravenous Daily  . heparin  5,000 Units Subcutaneous Q8H  . hydrALAZINE  25 mg Oral TID  . labetalol  100 mg Oral BID  . levothyroxine  50 mcg Oral QAC breakfast  . losartan  100 mg Oral QHS  . magnesium oxide  400 mg Oral Daily  . memantine  5 mg Oral BID  . mirtazapine  7.5 mg Oral QHS  . multivitamin with minerals  1 tablet Oral Daily  . OLANZapine  7.5 mg Oral QHS  . pantoprazole  40 mg Oral Daily  . polyethylene glycol  17 g Oral Daily  . potassium chloride  20 mEq Oral Q M,W,F  .  senna-docusate  2 tablet Oral BID  . vitamin B-12  1,000 mcg Oral Daily    Continuous Infusions: . cefTRIAXone (ROCEPHIN)  IV 1 g (05/31/19 1235)     LOS: 3 days     Kayleen Memos, MD [facility] Pager [phone]  If 7PM-7AM, please contact night-coverage [email or web address] Password [email or web address] 06/01/2019, 11:09 AM

## 2019-06-02 LAB — BASIC METABOLIC PANEL
Anion gap: 9 (ref 5–15)
BUN: 24 mg/dL — ABNORMAL HIGH (ref 8–23)
CO2: 32 mmol/L (ref 22–32)
Calcium: 9.2 mg/dL (ref 8.9–10.3)
Chloride: 99 mmol/L (ref 98–111)
Creatinine, Ser: 0.65 mg/dL (ref 0.44–1.00)
GFR calc Af Amer: >60 mL/min (ref >60)
GFR calc non Af Amer: >60 mL/min (ref >60)
Glucose, Bld: 103 mg/dL — ABNORMAL HIGH (ref 70–99)
Potassium: 3.6 mmol/L (ref 3.5–5.1)
Sodium: 140 mmol/L (ref 135–145)

## 2019-06-02 LAB — PROCALCITONIN: Procalcitonin: 0.22 ng/mL

## 2019-06-02 MED ORDER — HYDRALAZINE HCL 25 MG PO TABS: 25.0000 mg | ORAL_TABLET | Freq: Three times a day (TID) | ORAL | 0 refills | Status: AC

## 2019-06-02 NOTE — Plan of Care (Signed)
  Problem: Pain Managment: Goal: General experience of comfort will improve Outcome: Progressing   Problem: Safety: Goal: Ability to remain free from injury will improve Outcome: Progressing   Problem: Skin Integrity: Goal: Risk for impaired skin integrity will decrease Outcome: Progressing   

## 2019-06-02 NOTE — Evaluation (Addendum)
Occupational Therapy Evaluation Patient Details Name: Margaret Li MRN: [id number] DOB: 09/12/1929 Today's Date: 06/02/2019    History of Present Illness Margaret Li  is a 85 y.o. female, with past medical history significant for hypertension, history of CVA, dementia, hypothyroidism, depression with anxiety, AAA, recent hospitalization last March secondary to diverticular GI bleed, patient presented to emergency department secondary to complaints of shortness of breath and diagnosed with pneumonia.     Clinical Impression   This 85 yo female admitted with above presents to acute OT with decreased mobility, decreased balance, decreased safety awareness and generalized weakness all affecting her safety and ability to A with basic ADLs. She will benefit from acute OT with follow up at SNF.  O2: 1 liter while supine in bed 99% HR 63         RA while supine in bed 97% HR 63         RA while seated EOB 93% HR 67         RA with ambulating 6 feet (all she could do safely getting tired) 92% HR 69  Left on RA at end of session with O2 sats 100% and HR 66    Follow Up Recommendations  SNF;Supervision/Assistance - 24 hour    Equipment Recommendations  None recommended by OT       Precautions / Restrictions Precautions Precautions: Fall      Mobility Bed Mobility Overal bed mobility: Needs Assistance Bed Mobility: Supine to Sit     Supine to sit: Max assist;HOB elevated     General bed mobility comments: VCs for hand placement to try to A and for sequencing  Transfers Overall transfer level: Needs assistance Equipment used: Rolling walker (2 wheeled) Transfers: Sit to/from [facility] Sit to Stand: Mod assist Stand pivot transfers: Mod assist            Balance Overall balance assessment: Needs assistance Sitting-balance support: No upper extremity supported;Feet supported Sitting balance-Leahy Scale: Fair     Standing balance support: Bilateral upper  extremity supported Standing balance-Leahy Scale: Poor Standing balance comment: reliant on RW and additional external support                           ADL either performed or assessed with clinical judgement   ADL Overall ADL's : Needs assistance/impaired Eating/Feeding: Set up;Sitting Eating/Feeding Details (indicate cue type and reason): in recliner Grooming: Minimal assistance Grooming Details (indicate cue type and reason): in recliner Upper Body Bathing: Minimal assistance;Sitting Upper Body Bathing Details (indicate cue type and reason): in recliner Lower Body Bathing: Maximal assistance Lower Body Bathing Details (indicate cue type and reason): Mod A sit<>stand Upper Body Dressing : Moderate assistance;Sitting Upper Body Dressing Details (indicate cue type and reason): in recliner Lower Body Dressing: Total assistance Lower Body Dressing Details (indicate cue type and reason): Mod A sit<>stand Toilet Transfer: Moderate assistance;Stand-pivot;BSC;RW   Toileting- Clothing Manipulation and Hygiene: Maximal assistance Toileting - Clothing Manipulation Details (indicate cue type and reason): Mod A sit<>stand             Vision Patient Visual Report: No change from baseline              Pertinent Vitals/Pain Pain Assessment: No/denies pain     Hand Dominance Right   Extremity/Trunk Assessment Upper Extremity Assessment Upper Extremity Assessment: Generalized weakness           Communication Communication Communication: No difficulties  Cognition Arousal/Alertness: Awake/alert Behavior During Therapy: WFL for tasks assessed/performed Overall Cognitive Status: No family/caregiver present to determine baseline cognitive functioning(h/o dementia)                                                Home Living Family/patient expects to be discharged to:: Assisted living                             Home Equipment:  Walker - 4 wheels          Prior Functioning/Environment Level of Independence: Needs assistance    ADL's / Homemaking Assistance Needed: Pt reports she needs A with bathing and toileting but she can do some of her dressing            OT Problem List: Decreased strength;Impaired balance (sitting and/or standing);Decreased cognition;Decreased safety awareness      OT Treatment/Interventions: Self-care/ADL training;DME and/or AE instruction;Patient/family education;Balance training    OT Goals(Current goals can be found in the care plan section) Acute Rehab OT Goals Patient Stated Goal: pt agreed to get up OT Goal Formulation: With patient Time For Goal Achievement: 06/16/19 Potential to Achieve Goals: Good  OT Frequency: Min 2X/week              AM-PAC OT "6 Clicks" Daily Activity     Outcome Measure Help from another person eating meals?: A Little Help from another person taking care of personal grooming?: A Little Help from another person toileting, which includes using toliet, bedpan, or urinal?: A Lot Help from another person bathing (including washing, rinsing, drying)?: A Lot Help from another person to put on and taking off regular upper body clothing?: A Little Help from another person to put on and taking off regular lower body clothing?: Total 6 Click Score: 14   End of Session Equipment Utilized During Treatment: Gait belt;Rolling walker Nurse Communication: Mobility status(sats)  Activity Tolerance: Patient tolerated treatment well Patient left: in chair;with call bell/phone within reach;with chair alarm set  OT Visit Diagnosis: Unsteadiness on feet (R26.81);Other abnormalities of gait and mobility (R26.89);Muscle weakness (generalized) (M62.81);Other symptoms and signs involving cognitive function                Time: 5427-0623 OT Time Calculation (min): 29 min Charges:  OT General Charges $OT Visit: 1 Visit OT Evaluation $OT Eval Moderate Complexity:  1 Mod OT Treatments $Self Care/Home Management : 8-22 mins  [place] [address], OTR/L Acute [employer] Pager [phone] Office [phone]     Almon Register 06/02/2019, 11:30 AM

## 2019-06-02 NOTE — TOC Initial Note (Signed)
Transition of Care [facility]) - Initial/Assessment Note    Patient Details  Name: Margaret Li MRN: [id number] Date of Birth: 01/04/1929  Transition of Care [facility]) CM/SW Contact:    Ninfa Meeker, RN Phone Number: [phone] (working remotely) 06/02/2019, 11:06 AM  Clinical Narrative:   85 yr old female admitted from [facility] with pneumonia. Case manager has spoken with Carloyn Jaeger, [place] director at [facility], concerning patient being ready for discharge. Charity requested [facility] and Discharge summary to review to determine if they are able to provide level of care patient will need. CM sent requested information via the [place]. Will continue to follow.                      Patient Goals and CMS Choice        Expected Discharge Plan and Services           Expected Discharge Date: 06/02/19                                    Prior Living Arrangements/Services   Lives with:: Facility Resident                   Activities of Daily Living      Permission Sought/Granted                  Emotional Assessment              Admission diagnosis:  Shortness of breath [R06.02] Community acquired pneumonia of right lung, unspecified part of lung [J18.9] Patient Active Problem List   Diagnosis Date Noted  . CAP (community acquired pneumonia) 05/28/2019  . GIB (gastrointestinal bleeding) 01/19/2019  . Hypokalemia   . Hypomagnesemia   . Dysphagia   . Pressure injury of skin 11/10/2018  . Syncope 11/09/2018  . AAA (abdominal aortic aneurysm) ([place]) 11/09/2018  . CKD (chronic kidney disease), stage II 11/09/2018  . History of CVA (cerebrovascular accident) 11/09/2018  . Orthostasis 11/09/2018  . Dementia ([place]) 11/09/2018  . Major depressive disorder with psychotic features ([place]) 05/11/2018  . Vision loss, left eye 02/17/2017  . Visual hallucinations 02/17/2017  . Neuropathy 02/17/2017  . Hypertension 06/14/2015  .  Hypothyroidism 06/14/2015  . Glaucoma 06/14/2015  . Rectal bleeding 06/13/2015   PCP:  Reymundo Poll, MD Pharmacy:   [place] [place] [address] Phone: [phone] Fax: [phone]     Social Determinants of Health ([place]) Interventions    Readmission Risk Interventions No flowsheet data found.

## 2019-06-02 NOTE — Discharge Instructions (Signed)
Community-Acquired Pneumonia, Adult °Pneumonia is an infection of the lungs. It causes swelling in the airways of the lungs. Mucus and fluid may also build up inside the airways. °One type of pneumonia can happen while a person is in a hospital. A different type can happen when a person is not in a hospital (community-acquired pneumonia).  °What are the causes? ° °This condition is caused by germs (viruses, bacteria, or fungi). Some types of germs can be passed from one person to another. This can happen when you breathe in droplets from the cough or sneeze of an infected person. °What increases the risk? °You are more likely to develop this condition if you: °· Have a long-term (chronic) disease, such as: °? Chronic obstructive pulmonary disease (COPD). °? Asthma. °? Cystic fibrosis. °? Congestive heart failure. °? Diabetes. °? Kidney disease. °· Have HIV. °· Have sickle cell disease. °· Have had your spleen removed. °· Do not take good care of your teeth and mouth (poor dental hygiene). °· Have a medical condition that increases the risk of breathing in droplets from your own mouth and nose. °· Have a weakened body defense system (immune system). °· Are a smoker. °· Travel to areas where the germs that cause this illness are common. °· Are around certain animals or the places they live. °What are the signs or symptoms? °· A dry cough. °· A wet (productive) cough. °· Fever. °· Sweating. °· Chest pain. This often happens when breathing deeply or coughing. °· Fast breathing or trouble breathing. °· Shortness of breath. °· Shaking chills. °· Feeling tired (fatigue). °· Muscle aches. °How is this treated? °Treatment for this condition depends on many things. Most adults can be treated at home. In some cases, treatment must happen in a hospital. Treatment may include: °· Medicines given by mouth or through an IV tube. °· Being given extra oxygen. °· Respiratory therapy. °In rare cases, treatment for very bad pneumonia  may include: °· Using a machine to help you breathe. °· Having a procedure to remove fluid from around your lungs. °Follow these instructions at home: °Medicines °· Take over-the-counter and prescription medicines only as told by your doctor. °? Only take cough medicine if you are losing sleep. °· If you were prescribed an antibiotic medicine, take it as told by your doctor. Do not stop taking the antibiotic even if you start to feel better. °General instructions ° °· Sleep with your head and neck raised (elevated). You can do this by sleeping in a recliner or by putting a few pillows under your head. °· Rest as needed. Get at least 8 hours of sleep each night. °· Drink enough water to keep your pee (urine) pale yellow. °· Eat a healthy diet that includes plenty of vegetables, fruits, whole grains, low-fat dairy products, and lean protein. °· Do not use any products that contain nicotine or tobacco. These include cigarettes, e-cigarettes, and chewing tobacco. If you need help quitting, ask your doctor. °· Keep all follow-up visits as told by your doctor. This is important. °How is this prevented? °A shot (vaccine) can help prevent pneumonia. Shots are often suggested for: °· People older than 83 years of age. °· People older than 83 years of age who: °? Are having cancer treatment. °? Have long-term (chronic) lung disease. °? Have problems with their body's defense system. °You may also prevent pneumonia if you take these actions: °· Get the flu (influenza) shot every year. °· Go to the dentist as   often as told. °· Wash your hands often. If you cannot use soap and water, use hand sanitizer. °Contact a doctor if: °· You have a fever. °· You lose sleep because your cough medicine does not help. °Get help right away if: °· You are short of breath and it gets worse. °· You have more chest pain. °· Your sickness gets worse. This is very serious if: °? You are an older adult. °? Your body's defense system is weak. °· You  cough up blood. °Summary °· Pneumonia is an infection of the lungs. °· Most adults can be treated at home. Some will need treatment in a hospital. °· Drink enough water to keep your pee pale yellow. °· Get at least 8 hours of sleep each night. °This information is not intended to replace advice given to you by your health care provider. Make sure you discuss any questions you have with your health care provider. °Document Released: 04/10/2008 Document Revised: 02/12/2019 Document Reviewed: 06/20/2018 °Elsevier Patient Education © 2020 [employer] ° °

## 2019-06-02 NOTE — NC FL2 (Addendum)
[place] LEVEL OF CARE SCREENING TOOL     IDENTIFICATION  Patient Name: Margaret Li Birthdate: 1929/08/31 Sex: female Admission Date (Current Location): 05/28/2019  [facility] and [place] Number:  Herbalist and Address:  [facility], [place] [address], [place],  [address]      Provider Number: [id number]  Attending Physician Name and Address:  Kayleen Memos, DO  Relative Name and Phone Number:  Jacqlyn Larsen  [phone] or [phone]    Current Level of Care: Hospital Recommended Level of Care: [place] Prior Approval Number:    Date Approved/Denied:   PASRR Number: [id number]  Discharge Plan:      Current Diagnoses: Patient Active Problem List   Diagnosis Date Noted  . CAP (community acquired pneumonia) 05/28/2019  . GIB (gastrointestinal bleeding) 01/19/2019  . Hypokalemia   . Hypomagnesemia   . Dysphagia   . Pressure injury of skin 11/10/2018  . Syncope 11/09/2018  . AAA (abdominal aortic aneurysm) ([place]) 11/09/2018  . CKD (chronic kidney disease), stage II 11/09/2018  . History of CVA (cerebrovascular accident) 11/09/2018  . Orthostasis 11/09/2018  . Dementia ([place]) 11/09/2018  . Major depressive disorder with psychotic features ([place]) 05/11/2018  . Vision loss, left eye 02/17/2017  . Visual hallucinations 02/17/2017  . Neuropathy 02/17/2017  . Hypertension 06/14/2015  . Hypothyroidism 06/14/2015  . Glaucoma 06/14/2015  . Rectal bleeding 06/13/2015    Orientation RESPIRATION BLADDER Height & Weight     Self(dementia)  Normal Incontinent Weight: 62 kg Height:  5\' 4"  (162.6 cm)  BEHAVIORAL SYMPTOMS/MOOD NEUROLOGICAL BOWEL NUTRITION STATUS      Incontinent (heart healthy diet)  AMBULATORY STATUS COMMUNICATION OF NEEDS Skin   Extensive Assist Verbally Normal                       Personal Care Assistance Level of Assistance  Bathing, Dressing Bathing Assistance: Maximum  assistance   Dressing Assistance: Limited assistance     Functional Limitations Info  Hearing(no hearin aids)          SPECIAL CARE FACTORS FREQUENCY  PT (By licensed PT), OT (By licensed OT)     PT Frequency: 5/week OT Frequency: 5/week            ntractures Contractures Info: Not present    Additional Factors Info  Code Status Code Status Info: DNE             Current Medications (06/02/2019):      Discharge Medications: Please see discharge summary for a list of discharge medications.  Medication List    STOP taking these medications   Belsomra 5 MG Tabs Generic drug: Suvorexant   dorzolamide-timolol 22.3-6.8 MG/ML ophthalmic solution Commonly known as: COSOPT   sertraline 50 MG tablet Commonly known as: ZOLOFT     TAKE these medications   acetaminophen 500 MG tablet Commonly known as: TYLENOL Take 500 mg by mouth every 6 (six) hours as needed for mild pain or headache.   Antacid Maximum 400 MG chewable tablet Generic drug: calcium elemental as carbonate Chew 1,000 mg by mouth at bedtime.   antiseptic oral rinse Liqd 30 mLs by Mouth Rinse route 2 (two) times daily.   aspirin 81 MG EC tablet Take 1 tablet (81 mg total) by mouth daily.   bisacodyl 10 MG suppository Commonly known as: DULCOLAX Place 1 suppository (10 mg total) rectally every Sunday.   busPIRone 5 MG tablet  Commonly known as: BUSPAR Take 2.5 mg by mouth 2 (two) times daily.   DIABETIC TUSSIN EX 100 MG/5ML liquid Generic drug: guaiFENesin Take 200 mg by mouth every 4 (four) hours as needed for cough or congestion.   Ensure Take 237 mLs by mouth 2 (two) times daily. Chocolate   fluticasone 50 MCG/ACT nasal spray Commonly known as: FLONASE Place 1 spray into both nostrils daily.   furosemide 20 MG tablet Commonly known as: LASIX Take 20 mg by mouth daily.   hydrALAZINE 25 MG tablet Commonly known as: APRESOLINE Take 1 tablet (25 mg total) by mouth 3  (three) times daily. What changed:   medication strength  how much to take   labetalol 100 MG tablet Commonly known as: NORMODYNE Take 100 mg by mouth 2 (two) times daily. Hold for SBP <100 or pulse <55   levothyroxine 50 MCG tablet Commonly known as: SYNTHROID Take 50 mcg by mouth daily before breakfast.   losartan 100 MG tablet Commonly known as: COZAAR Take 100 mg by mouth at bedtime.   magnesium oxide 400 MG tablet Commonly known as: MAG-OX Take 1 tablet (400 mg total) by mouth daily.   memantine 5 MG tablet Commonly known as: NAMENDA Take 5 mg by mouth 2 (two) times daily.   mirtazapine 7.5 MG tablet Commonly known as: REMERON Take 7.5 mg by mouth at bedtime.   Multivitamin Women 50+ Tabs Take 1 tablet by mouth daily. GUMMIE   Netarsudil Dimesylate 0.02 % Soln Place 1 drop into both eyes at bedtime. "Rhopressa"   nitroGLYCERIN 0.4 MG SL tablet Commonly known as: NITROSTAT Place 1 tablet (0.4 mg total) under the tongue every 5 (five) minutes as needed for chest pain (q47mins up to three times).   OLANZapine 7.5 MG tablet Commonly known as: ZYPREXA Take 7.5 mg by mouth at bedtime.   pantoprazole 40 MG tablet Commonly known as: PROTONIX Take 1 tablet (40 mg total) by mouth daily.   polyethylene glycol 17 g packet Commonly known as: MIRALAX / GLYCOLAX Take 17 g by mouth 2 (two) times daily. What changed:   when to take this  reasons to take this   potassium chloride 20 MEQ/15ML (10%) Soln Take 15 mLs by mouth every Monday, Wednesday, and Friday.   Proctozone-HC 2.5 % rectal cream Generic drug: hydrocortisone Place 1 application rectally as needed.   sennosides-docusate sodium 8.6-50 MG tablet Commonly known as: SENOKOT-S Take 1 tablet by mouth daily.   vitamin B-12 1000 MCG tablet Commonly known as: CYANOCOBALAMIN Take 1,000 mcg by mouth daily.         Relevant Imaging Results:  Relevant Lab Results:   Additional  Information [phone]  Ninfa Meeker, RN

## 2019-06-02 NOTE — Discharge Summary (Addendum)
Discharge Summary  Margaret Li CZY:[id number] DOB: 1929-04-16  PCP: Margaret Poll, MD  Admit date: 05/28/2019 Discharge date: 06/02/2019  Time spent: 35 minutes   Recommendations for Outpatient Follow-up:  1. Follow-up with your PCP 2. Continue physical therapy 3. Fall precautions 4. Take your medications as prescribed  Discharge Diagnoses:  Active Hospital Problems   Diagnosis Date Noted   CAP (community acquired pneumonia) 05/28/2019   CKD (chronic kidney disease), stage II 11/09/2018   Dementia ([place]) 11/09/2018   History of CVA (cerebrovascular accident) 11/09/2018   Vision loss, left eye 02/17/2017   Hypertension 06/14/2015   Hypothyroidism 06/14/2015    Resolved Hospital Problems  No resolved problems to display.    Discharge Condition: Stable  Diet recommendation: Resume previous diet  Vitals:   06/01/19 2102 06/02/19 0421  BP: (!) 162/84 (!) 151/83  Pulse: 69 70  Resp: 16 12  Temp: 98.2 F (36.8 C) (!) 97.3 F (36.3 C)  SpO2: 100% 100%    History of present illness:   HelenCumbois a85 y.o.female,with past medical history significant for hypertension, history of CVA, hypothyroidism, depression with anxiety, AAA, recent hospitalization last March secondary to diverticular GI bleed, patient presents to emergency department secondary to complaints of shortness of breath, patient is demented, but she is able to answer questions appropriately, reports shortness of breath for last 3 days, progressive, minimally ambulatory, worsened by laying supine, she denies fever, chills, cough, diarrhea, nausea or vomiting,S patient was hypoxic 88% on room air, currently she is on 3 L nasal cannula in ED, which I have stopped and she remains saturating 98%. - in [place] chest x-ray significant for opacity suspicious for community-acquired pneumonia, she was started on doxycycline and Rocephin, she was hypoxic by EMS 88% on room air, she was on 3 L nasal cannula, which I  have stopped, she remained 98% on room air, she has lower extremity edema, elevated proBNP, and distended JVD as well, I was requested to admit for further evaluation. Negative Covid-19 test.  06/02/19: Patient was seen and examined at her bedside this morning.  No acute events overnight.  States she feels better.  She has no new complaints.  Will obtain home O2 evaluation prior to discharge.  Home O2 evaluation reported per OT on 06/02/19: O2: 1 liter while supine in bed 99% HR 63         RA while supine in bed 97% HR 63         RA while seated EOB 93% HR 67         RA with ambulating 6 feet (all she could do safely getting tired) 92% HR 69  Left on RA at end of session with O2 sats 100% and HR 66  Hospital Course:  Active Problems:   Hypertension   Hypothyroidism   Vision loss, left eye   CKD (chronic kidney disease), stage II   History of CVA (cerebrovascular accident)   Dementia ([place])   CAP (community acquired pneumonia)  Resolving multifocal community-acquired pneumonia Presented with shortness of breath and hypoxia, CTA NEGATIVE FOR PE but showed bilateral pulmonary infiltrates and bilateral pleural effusions right greater than left. Elevated procalcitonin 1.50 on 05/29/2019>> procalcitonin 0.22 on 06/02/2019. Afebrile with no leukocytosis  Completed 5 days of IV Rocephin and p.o. doxycycline O2 saturation 100% on 2 L Home O2 evaluation prior to discharge  Resolving acute hypoxic respiratory failure secondary to multifocal community-acquired pneumonia Management as stated above Maintain O2 saturation greater than 92%  Chronic  constipation Continue bowel regimen.    Physical debilty/ambulatory dysfunction PT recommended Home health PT with 24h supervision Face to face in place Fall precautions  Uncontrolled hypertension Increased po Hydralazine to 25 mg daily Continue labetalol 100 mg twice daily, p.o. losartan 100 mg daily Completed Lasix 40 mg IV daily Resume  home Lasix dose  Acute on chronic diastolic CHF Presented with elevated BNP Her Volume Status Is Improving  Net I&O -4.6L since admission Last 2D echo done in January showed preserved LVEF 55% with grade 1 diastolic dysfunction Denies anginal symptoms. Resume home Lasix dose 20 mg daily  Elevated D-dimers CTA PE independently reviewed showed no sign of pulmonary embolism Doppler ultrasound B/L lower extremity showed no sign of acute DVT  Hypothyroidism Continue levothyroxine  GERD Continue PPI  Dementia with no behavioral disturbance Continue home medications  History of CVA No acute issues Continue aspirin   Code StatusDNR   Discharge Exam: BP (!) 151/83 (BP Location: Right Arm) Comment: Nurse notified of bp   Pulse 70    Temp (!) 97.3 F (36.3 C) (Oral)    Resp 12    Ht 5\' 4"  (1.626 m)    Wt 62 kg    SpO2 100%    BMI 23.46 kg/m   General: 85 y.o. year-old female well developed well nourished in no acute distress.  Alert and interactive in the setting of mild dementia.  Cardiovascular: Regular rate and rhythm with no rubs or gallops.  No thyromegaly or JVD noted.    Respiratory: Clear to auscultation with no wheezes or rales. Good inspiratory effort.  Abdomen: Soft nontender nondistended with normal bowel sounds x4 quadrants.  Musculoskeletal: Lower extremity edema has resolved. 2/4 pulses in all 4 extremities.  Psychiatry: Mood is appropriate for condition and setting  Discharge Instructions You were cared for by a hospitalist during your hospital stay. If you have any questions about your discharge medications or the care you received while you were in the hospital after you are discharged, you can call the unit and asked to speak with the hospitalist on call if the hospitalist that took care of you is not available. Once you are discharged, your primary care physician will handle any further medical issues. Please note that NO REFILLS for any discharge  medications will be authorized once you are discharged, as it is imperative that you return to your primary care physician (or establish a relationship with a primary care physician if you do not have one) for your aftercare needs so that they can reassess your need for medications and monitor your lab values.   Allergies as of 06/02/2019      Reactions   Trazodone And Nefazodone Anaphylaxis, Swelling, Rash   Tongue swells and "Allergic," per MAR from [facility]   Clarithromycin Nausea Only, Other (See Comments)   GI side effects and "Allergic," per MAR from [facility]    Codeine Nausea Only, Other (See Comments)   GI side effects and "Allergic," per MAR from [facility]    Duloxetine Hcl Nausea Only, Other (See Comments)   GI side effects and "Allergic," per MAR from [facility]    Escitalopram Oxalate Nausea Only, Other (See Comments)   Dizziness and "Allergic," per MAR from [facility]    Fluoxetine Nausea Only, Other (See Comments)   Abnormal weight loss, GI side effects, and "Allergic," per MAR from [facility]    Ibandronic Acid Other (See Comments)   Groin pain and "Allergic," per [facility]  from [facility] Other (See Comments)   "Allergic," per [facility] from [facility] (specific reaction not noted)   Norvasc [amlodipine Besylate] Other (See Comments)   Abdominal pain, bad dreams and "Allergic," per MAR from [place] [fluoxetine Hcl] Nausea Only, Other (See Comments)   Abnormal weight loss, GI upset and "Allergic," per [facility] from [facility]      Medication List    STOP taking these medications   Belsomra 5 MG Tabs Generic drug: Suvorexant   dorzolamide-timolol 22.3-6.8 MG/ML ophthalmic solution Commonly known as: COSOPT   sertraline 50 MG tablet Commonly known as: ZOLOFT     TAKE these medications   acetaminophen 500 MG tablet Commonly known as: TYLENOL Take 500 mg by mouth every 6 (six) hours as needed  for mild pain or headache.   Antacid Maximum 400 MG chewable tablet Generic drug: calcium elemental as carbonate Chew 1,000 mg by mouth at bedtime.   antiseptic oral rinse Liqd 30 mLs by Mouth Rinse route 2 (two) times daily.   aspirin 81 MG EC tablet Take 1 tablet (81 mg total) by mouth daily.   bisacodyl 10 MG suppository Commonly known as: DULCOLAX Place 1 suppository (10 mg total) rectally every Sunday.   busPIRone 5 MG tablet Commonly known as: BUSPAR Take 2.5 mg by mouth 2 (two) times daily.   DIABETIC TUSSIN EX 100 MG/5ML liquid Generic drug: guaiFENesin Take 200 mg by mouth every 4 (four) hours as needed for cough or congestion.   Ensure Take 237 mLs by mouth 2 (two) times daily. Chocolate   fluticasone 50 MCG/ACT nasal spray Commonly known as: FLONASE Place 1 spray into both nostrils daily.   furosemide 20 MG tablet Commonly known as: LASIX Take 20 mg by mouth daily.   hydrALAZINE 25 MG tablet Commonly known as: APRESOLINE Take 1 tablet (25 mg total) by mouth 3 (three) times daily. What changed:   medication strength  how much to take   labetalol 100 MG tablet Commonly known as: NORMODYNE Take 100 mg by mouth 2 (two) times daily. Hold for SBP <100 or pulse <55   levothyroxine 50 MCG tablet Commonly known as: SYNTHROID Take 50 mcg by mouth daily before breakfast.   losartan 100 MG tablet Commonly known as: COZAAR Take 100 mg by mouth at bedtime.   magnesium oxide 400 MG tablet Commonly known as: MAG-OX Take 1 tablet (400 mg total) by mouth daily.   memantine 5 MG tablet Commonly known as: NAMENDA Take 5 mg by mouth 2 (two) times daily.   mirtazapine 7.5 MG tablet Commonly known as: REMERON Take 7.5 mg by mouth at bedtime.   Multivitamin Women 50+ Tabs Take 1 tablet by mouth daily. GUMMIE   Netarsudil Dimesylate 0.02 % Soln Place 1 drop into both eyes at bedtime. "Rhopressa"   nitroGLYCERIN 0.4 MG SL tablet Commonly known as:  NITROSTAT Place 1 tablet (0.4 mg total) under the tongue every 5 (five) minutes as needed for chest pain (q72mins up to three times).   OLANZapine 7.5 MG tablet Commonly known as: ZYPREXA Take 7.5 mg by mouth at bedtime.   pantoprazole 40 MG tablet Commonly known as: PROTONIX Take 1 tablet (40 mg total) by mouth daily.   polyethylene glycol 17 g packet Commonly known as: MIRALAX / GLYCOLAX Take 17 g by mouth 2 (two) times daily. What changed:   when to take this  reasons to take this   potassium chloride  20 MEQ/15ML (10%) Soln Take 15 mLs by mouth every Monday, Wednesday, and Friday.   Proctozone-HC 2.5 % rectal cream Generic drug: hydrocortisone Place 1 application rectally as needed.   sennosides-docusate sodium 8.6-50 MG tablet Commonly known as: SENOKOT-S Take 1 tablet by mouth daily.   vitamin B-12 1000 MCG tablet Commonly known as: CYANOCOBALAMIN Take 1,000 mcg by mouth daily.      Allergies  Allergen Reactions   Trazodone And Nefazodone Anaphylaxis, Swelling and Rash    Tongue swells and "Allergic," per MAR from [facility]   Clarithromycin Nausea Only and Other (See Comments)    GI side effects and "Allergic," per MAR from [facility]    Codeine Nausea Only and Other (See Comments)    GI side effects and "Allergic," per MAR from [facility]    Duloxetine Hcl Nausea Only and Other (See Comments)    GI side effects and "Allergic," per MAR from [facility]    Escitalopram Oxalate Nausea Only and Other (See Comments)    Dizziness and "Allergic," per MAR from [facility]    Fluoxetine Nausea Only and Other (See Comments)    Abnormal weight loss, GI side effects, and "Allergic," per MAR from [facility]    Ibandronic Acid Other (See Comments)    Groin pain and "Allergic," per MAR from [facility] Other (See Comments)    "Allergic," per [facility] from [facility] (specific reaction not noted)   Norvasc  [Amlodipine Besylate] Other (See Comments)    Abdominal pain, bad dreams and "Allergic," per MAR from [place] [Fluoxetine Hcl] Nausea Only and Other (See Comments)    Abnormal weight loss, GI upset and "Allergic," per [facility] from [place], MD. Call in 1 day(s).   Specialty: Family Medicine Why: Please call for a post hospital follow-up appointment. Contact information: [address]  [id number] [phone]            The results of significant diagnostics from this hospitalization (including imaging, microbiology, ancillary and laboratory) are listed below for reference.    Significant Diagnostic Studies: Ct Angio Chest Pe W/cm &/or Wo Cm  Result Date: 05/28/2019 CLINICAL DATA:  Shortness of breath, D-dimer EXAM: CT ANGIOGRAPHY CHEST WITH CONTRAST TECHNIQUE: Multidetector CT imaging of the chest was performed using the standard protocol during bolus administration of intravenous contrast. Multiplanar CT image reconstructions and MIPs were obtained to evaluate the vascular anatomy. CONTRAST:  50mL OMNIPAQUE IOHEXOL 350 MG/ML SOLN COMPARISON:  10/29/2017 FINDINGS: Cardiovascular: Slightly early phase of contrast, which limits evaluation of the distal segmental and subsegmental vessels. Within this limitation, no evidence of pulmonary embolism. No evidence of pulmonary embolism. Aortic atherosclerosis. Normal heart size. Coronary artery calcifications. No pericardial effusion. Mediastinum/Nodes: No enlarged mediastinal, hilar, or axillary lymph nodes. Thyroid gland, trachea, and esophagus demonstrate no significant findings. Lungs/Pleura: There is extensive bilateral, clustered ground-glass and heterogeneous pulmonary opacity, as well as masslike consolidation of the right lower and middle lobes. Small bilateral pleural effusions. Upper Abdomen: No acute abnormality. Musculoskeletal: No chest wall  abnormality. Bilateral breast implants. No acute or significant osseous findings. Review of the MIP images confirms the above findings. IMPRESSION: 1. Slightly early phase of contrast, which limits evaluation of the distal segmental and subsegmental vessels. Within this limitation, no evidence of pulmonary embolism. 2. There is extensive bilateral, clustered ground-glass and heterogeneous pulmonary opacity, as well as  masslike consolidation of the right lower and middle lobes. Small bilateral pleural effusions. Findings most likely reflect extensive multifocal infection. Underlying mass of the right lower lobe is not excluded. Recommend CT or radiographic follow-up to complete resolution in 6-8 weeks. 3.  Coronary artery disease and aortic atherosclerosis. Electronically Signed   By: Eddie Candle M.D.   On: 05/28/2019 15:44   Dg Chest Port 1 View  Result Date: 05/28/2019 CLINICAL DATA:  Shortness of breath. EXAM: PORTABLE CHEST 1 VIEW COMPARISON:  Radiographs of November 30, 2018. FINDINGS: Stable cardiomegaly. Stable enlargement of descending thoracic aorta is noted. No pneumothorax is noted. Left lung is clear. Increased right lower lobe opacity is noted concerning for possible pneumonia. No significant pleural effusion is noted. Bony thorax is unremarkable. IMPRESSION: Findings most consistent with right lower lobe pneumonia. Followup PA and lateral chest X-ray is recommended in 3-4 weeks following trial of antibiotic therapy to ensure resolution and exclude underlying malignancy. Electronically Signed   By: Marijo Conception M.D.   On: 05/28/2019 10:08   Vas [place] Lower Extremity Venous (dvt)  Result Date: 05/29/2019  Lower Venous Study Indications: Edema, and elevated d dimer.  Comparison Study: no prior Performing Technologist: Abram Sander RVS  Examination Guidelines: A complete evaluation includes B-mode imaging, spectral Doppler, color Doppler, and power Doppler as needed of all accessible portions of  each vessel. Bilateral testing is considered an integral part of a complete examination. Limited examinations for reoccurring indications may be performed as noted.  +---------+---------------+---------+-----------+----------+-------+  RIGHT     Compressibility Phasicity Spontaneity Properties Summary  +---------+---------------+---------+-----------+----------+-------+  CFV       Full            Yes       Yes                             +---------+---------------+---------+-----------+----------+-------+  SFJ       Full                                                      +---------+---------------+---------+-----------+----------+-------+  FV Prox   Full                                                      +---------+---------------+---------+-----------+----------+-------+  FV Mid    Full                                                      +---------+---------------+---------+-----------+----------+-------+  FV Distal Full                                                      +---------+---------------+---------+-----------+----------+-------+  PFV       Full                                                      +---------+---------------+---------+-----------+----------+-------+  POP       Full            Yes       Yes                             +---------+---------------+---------+-----------+----------+-------+  PTV       Full                                                      +---------+---------------+---------+-----------+----------+-------+  PERO      Full                                                      +---------+---------------+---------+-----------+----------+-------+   +---------+---------------+---------+-----------+----------+--------------+  LEFT      Compressibility Phasicity Spontaneity Properties Summary         +---------+---------------+---------+-----------+----------+--------------+  CFV       Full            Yes       Yes                                     +---------+---------------+---------+-----------+----------+--------------+  SFJ       Full                                                             +---------+---------------+---------+-----------+----------+--------------+  FV Prox   Full                                                             +---------+---------------+---------+-----------+----------+--------------+  FV Mid    Full                                                             +---------+---------------+---------+-----------+----------+--------------+  FV Distal Full                                                             +---------+---------------+---------+-----------+----------+--------------+  PFV       Full                                                             +---------+---------------+---------+-----------+----------+--------------+  POP       Full            Yes       Yes                                    +---------+---------------+---------+-----------+----------+--------------+  PTV       Full                                                             +---------+---------------+---------+-----------+----------+--------------+  PERO                                                       Not visualized  +---------+---------------+---------+-----------+----------+--------------+     Summary: Right: There is no evidence of deep vein thrombosis in the lower extremity. No cystic structure found in the popliteal fossa. Left: There is no evidence of deep vein thrombosis in the lower extremity. No cystic structure found in the popliteal fossa.  *See table(s) above for measurements and observations. Electronically signed by Monica Martinez MD on 05/29/2019 at 5:40:38 PM.    Final     Microbiology: Recent Results (from the past 240 hour(s))  SARS Coronavirus 2 (CEPHEID- Performed in [place] hospital lab), Hosp Order     Status: None   Collection Time: 05/28/19 12:25 PM   Specimen: Nasopharyngeal Swab  Result Value Ref  Range Status   SARS Coronavirus 2 NEGATIVE NEGATIVE Final    Comment: (NOTE) If result is NEGATIVE SARS-CoV-2 target nucleic acids are NOT DETECTED. The SARS-CoV-2 RNA is generally detectable in upper and lower  respiratory specimens during the acute phase of infection. The lowest  concentration of SARS-CoV-2 viral copies this assay can detect is 250  copies / mL. A negative result does not preclude SARS-CoV-2 infection  and should not be used as the sole basis for treatment or other  patient management decisions.  A negative result may occur with  improper specimen collection / handling, submission of specimen other  than nasopharyngeal swab, presence of viral mutation(s) within the  areas targeted by this assay, and inadequate number of viral copies  (<250 copies / mL). A negative result must be combined with clinical  observations, patient history, and epidemiological information. If result is POSITIVE SARS-CoV-2 target nucleic acids are DETECTED. The SARS-CoV-2 RNA is generally detectable in upper and lower  respiratory specimens dur ing the acute phase of infection.  Positive  results are indicative of active infection with SARS-CoV-2.  Clinical  correlation with patient history and other diagnostic information is  necessary to determine patient infection status.  Positive results do  not rule out bacterial infection or co-infection with other viruses. If result is PRESUMPTIVE POSTIVE SARS-CoV-2 nucleic acids MAY BE PRESENT.   A presumptive positive result was obtained on the submitted specimen  and confirmed on repeat testing.  While 2019 novel coronavirus  (SARS-CoV-2) nucleic acids may be present in the submitted sample  additional confirmatory testing may be necessary for epidemiological  and / or clinical management purposes  to differentiate between  SARS-CoV-2 and other Sarbecovirus currently known to infect humans.  If clinically indicated additional testing with an  alternate test  methodology [phone]) is advised. The SARS-CoV-2 RNA is generally  detectable in upper and lower respiratory sp ecimens during the acute  phase of infection. The expected result is Negative. Fact Sheet for Patients:  StrictlyIdeas.no Fact Sheet for Healthcare Providers: BankingDealers.co.za This test is not yet approved or cleared by the [place] FDA and has been authorized for detection and/or diagnosis of SARS-CoV-2 by FDA under an Emergency Use Authorization (EUA).  This EUA will remain in effect (meaning this test can be used) for the duration of the COVID-19 declaration under Section 564(b)(1) of the Act, 21 U.S.C. section 360bbb-3(b)(1), unless the authorization is terminated or revoked sooner. Performed at [facility], [place] [address], [place], [place] [address]   MRSA PCR Screening     Status: None   Collection Time: 05/28/19  9:03 PM   Specimen: Nasopharyngeal  Result Value Ref Range Status   MRSA by PCR NEGATIVE NEGATIVE Final    Comment:        The GeneXpert MRSA Assay (FDA approved for NASAL specimens only), is one component of a comprehensive MRSA colonization surveillance program. It is not intended to diagnose MRSA infection nor to guide or monitor treatment for MRSA infections. Performed at [facility], [place] [address], [place], [place] [address]      Labs: Basic Metabolic Panel: Recent Labs  Lab 05/28/19 [phone] 05/29/19 0625 05/31/19 0800 06/01/19 0431 06/02/19 0447  NA 146* 143 141 139 140  K 3.4* 4.1 3.8 3.6 3.6  CL 108 104 101 100 99  CO2 29 29 32 31 32  GLUCOSE 97 108* 89 105* 103*  BUN 24* 24* 23 27* 24*  CREATININE 0.67 0.71 0.61 0.70 0.65  CALCIUM 9.2 9.2 9.2 9.0 9.2   Liver Function Tests: Recent Labs  Lab 05/28/19 0923  AST 15  ALT 10  ALKPHOS 75  BILITOT 0.5  PROT 6.3*  ALBUMIN 3.2*   No results for input(s): LIPASE, AMYLASE in the last 168  hours. No results for input(s): AMMONIA in the last 168 hours. CBC: Recent Labs  Lab 05/28/19 0923  WBC 7.5  NEUTROABS 6.1  HGB 11.4*  HCT 37.1  MCV 97.4  PLT 232   Cardiac Enzymes: No results for input(s): CKTOTAL, CKMB, CKMBINDEX, TROPONINI in the last 168 hours. BNP: BNP (last 3 results) Recent Labs    05/28/19 0924  BNP 217.3*    ProBNP (last 3 results) No results for input(s): PROBNP in the last 8760 hours.  CBG: No results for input(s): GLUCAP in the last 168 hours.     Signed:  Kayleen Memos, MD [facility] 06/02/2019, 11:45 AM

## 2019-06-02 NOTE — TOC Transition Note (Signed)
Transition of Care [facility]) - CM/SW Discharge Note   Patient Details  Name: JOLANTA CABEZA MRN: [id number] Date of Birth: 1929-07-08  Transition of Care [facility]) CM/SW Contact:  Ninfa Meeker, RN Phone Number: [phone] (working remotely) 06/02/2019, 2:04 PM   Clinical Narrative:   85 yr old female from [place]. Patient has been treated for pneumonia. Saturations are  In the 90's, she will not require oxygen at discharge. Case manager spoke with [facility], Director of [facility], [phone], to confirm that patient is able to return to the facility. Patient is going to be discharged via [facility]. Case manager contacted her cousin, Shauna Hugh [phone] and updated her on plan for patient to return to [facility]. She was appreciative of the call and said she would call patient in her room.  Case manager called bedside RN and asked that she be sure patient's phone was within reach to answer the call. CM called [facility] at @2 :05pm to arrange for transport. They are quite busy and will get to her as soon as vehicle is available. Medical Necessity form and face sheet have been printed to the unit. Bedside Nurse will call report to Floor nurse:[phone].          Patient Goals and CMS Choice        Discharge Placement             Patient will DC to:  [facility]                                 [address], [place] Date:                 July 27,2020          Discharge Plan and Services                                     Social Determinants of Health (SDOH) Interventions     Readmission Risk Interventions No flowsheet data found.

## 2019-06-02 NOTE — Care Management Important Message (Signed)
Important Message  Patient Details  Name: Margaret Li MRN: [id number] Date of Birth: May 15, 1929   Medicare Important Message Given:  Yes     [name] 06/02/2019, 3:12 PM   DUE TO MEDICAL  CONDITION UNABLE TO SIGN IM

## 2019-06-02 NOTE — Progress Notes (Signed)
Attempted to call report to facility, unable to get anyone on the phone and voice mail is full.

## 2019-06-03 DIAGNOSIS — B351 Tinea unguium: Secondary | ICD-10-CM | POA: Diagnosis not present

## 2019-06-03 DIAGNOSIS — I739 Peripheral vascular disease, unspecified: Secondary | ICD-10-CM | POA: Diagnosis not present

## 2019-08-07 DEATH — deceased

## 2019-09-08 IMAGING — CT CT HEAD W/O CM
3 series · 15 of 47 positions shown, 18 images · non-contrast
Comparison: 11/09/2018 and MRI brain 11/12/2018

CLINICAL DATA: Dementia with weakness and decreased p.o. intake.

EXAM:
CT HEAD WITHOUT CONTRAST
TECHNIQUE: Contiguous axial images were obtained from the base of the skull
through the vertex without intravenous contrast.

[Series 2: head wo · axial · 0.47mm/px · z∈[+1500,+1630]mm · 9 of 32 slices shown, 12 images]
[im 3/32  brain]
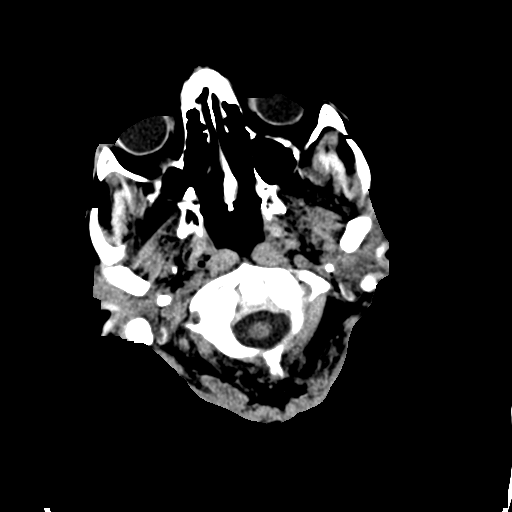
[im 3/32  bone]
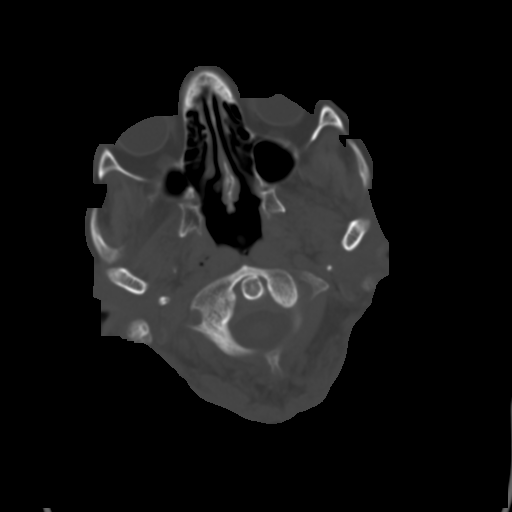
[im 6/32  brain]
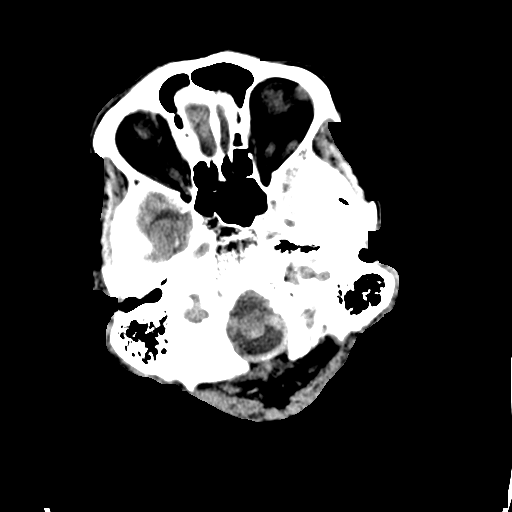
[im 9/32  brain]
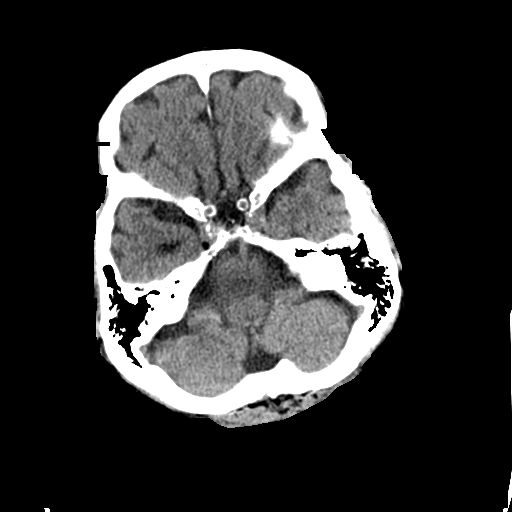
[im 12/32  brain]
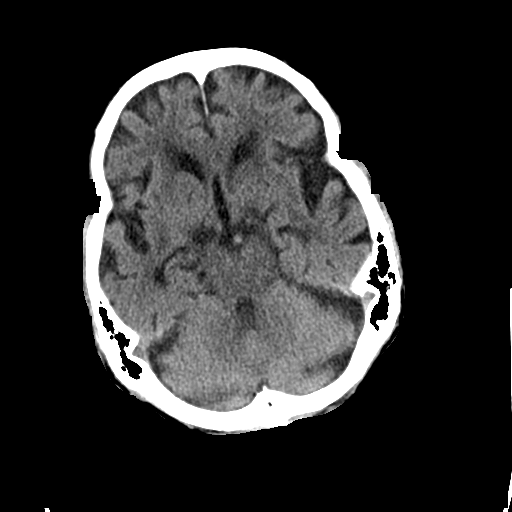
[im 17/32  brain]
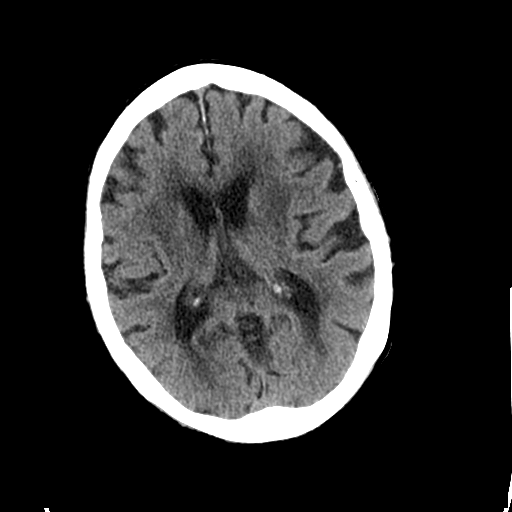
[im 17/32  bone]
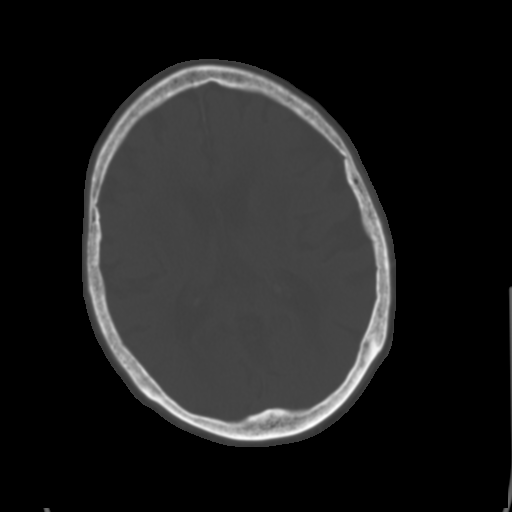
[im 20/32  brain]
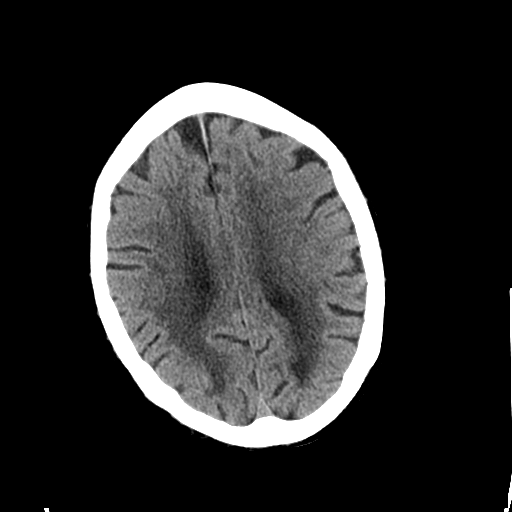
[im 23/32  brain]
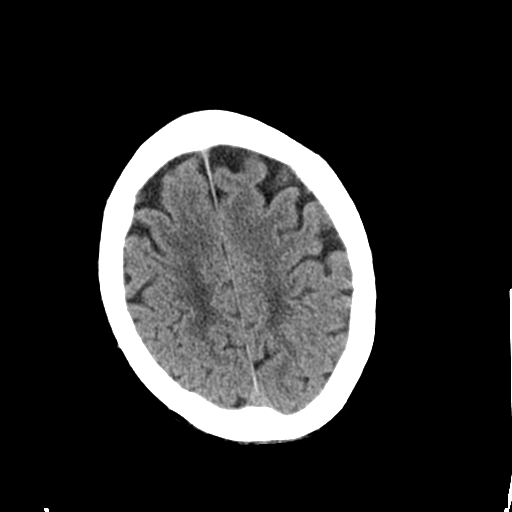
[im 26/32  brain]
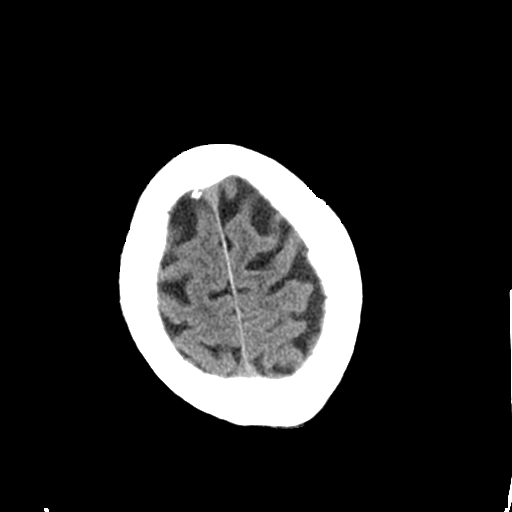
[im 29/32  brain]
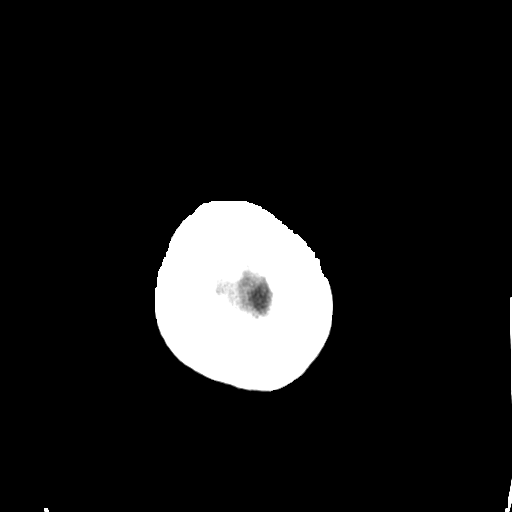
[im 29/32  bone]
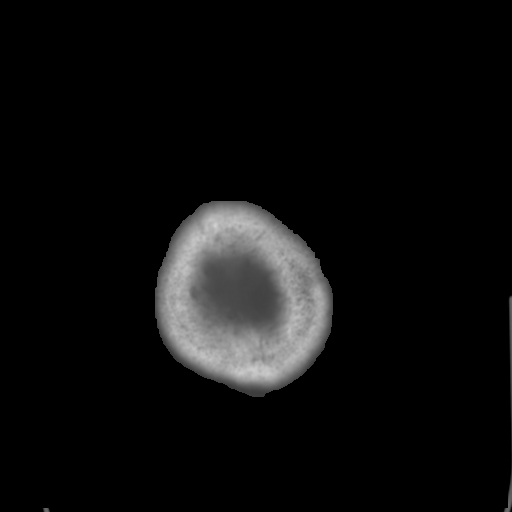

[Series 5: coronal soft tissue · coronal · 0.30mm/px · 3 of 63 slices shown]
[im 21/63  brain]
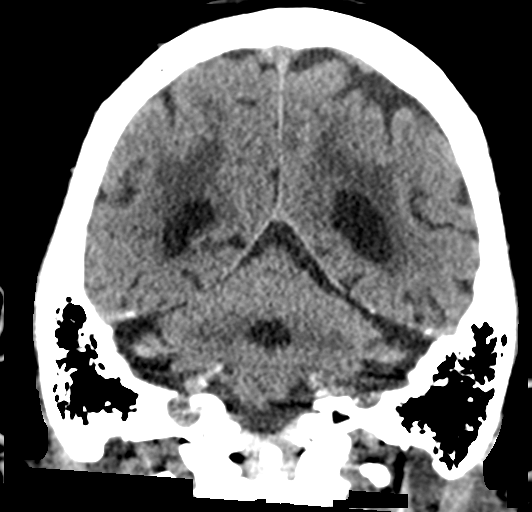
[im 28/63  brain]
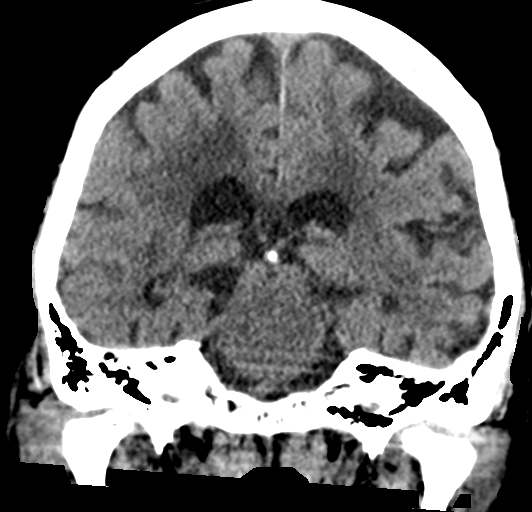
[im 35/63  brain]
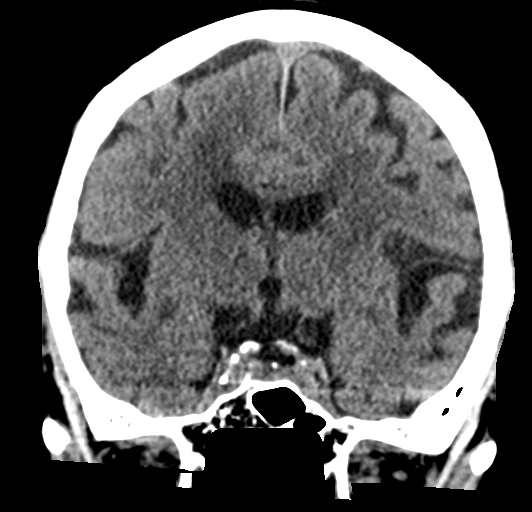

[Series 6: sagittal soft tissue · sagittal · 0.29mm/px · 3 of 49 slices shown]
[im 17/49  brain]
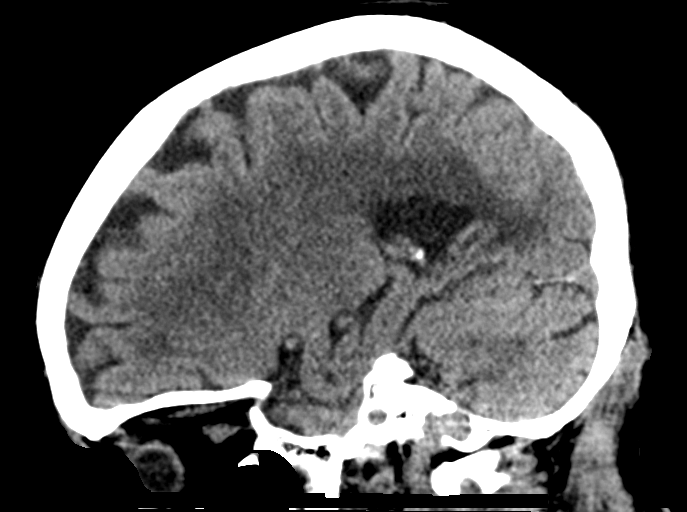
[im 25/49  brain]
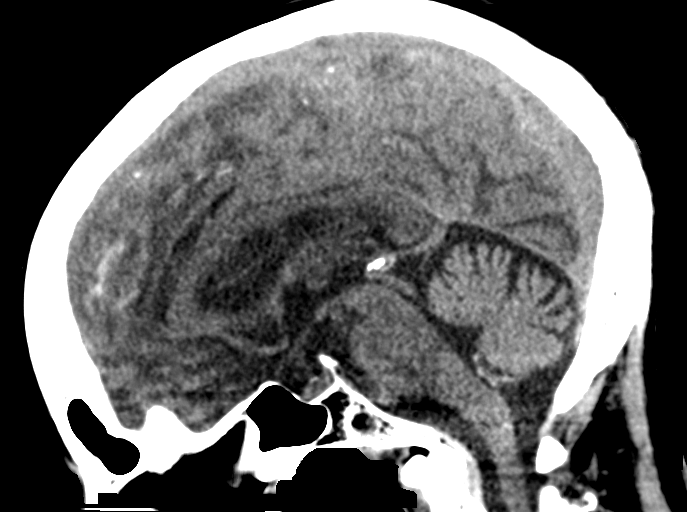
[im 33/49  brain]
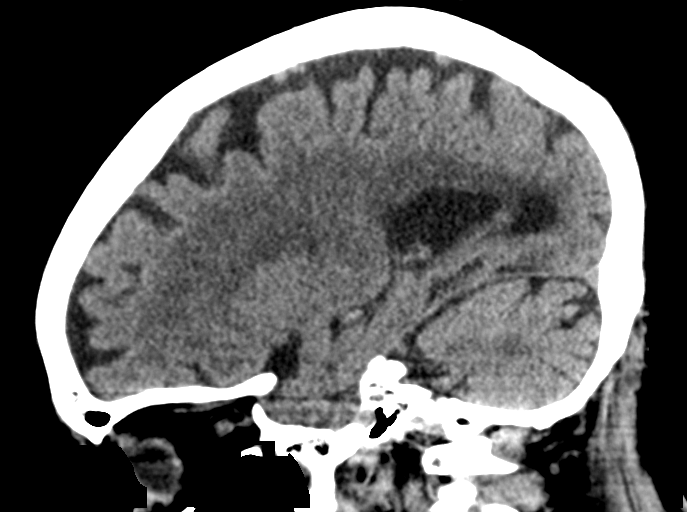

[15 of 47 positions shown; findings below may reference images not displayed]

FINDINGS: Brain: Ventricles, cisterns and other CSF spaces are within normal.
There is moderate chronic ischemic microvascular disease and mild
age related atrophic change. There is no mass, mass effect, shift of
midline structures or acute hemorrhage. No evidence of acute
infarction. Calcifications over the left basal ganglia.

Vascular: No hyperdense vessel or unexpected calcification.

Skull: Normal. Negative for fracture or focal lesion.

Sinuses/Orbits: Hypoplastic frontal sinuses. Remaining paranasal
sinuses and mastoid air cells are clear. Orbits are normal.

Other: None.
IMPRESSION: No acute findings.

Moderate chronic ischemic microvascular disease and age related
atrophic change.

## 2019-09-08 IMAGING — DX DG CHEST 1V PORT
1 series · 1 of 1 positions shown · non-contrast
Comparison: 11/12/2018

CLINICAL DATA: Hypotension

EXAM:
PORTABLE CHEST 1 VIEW

[chest ap]
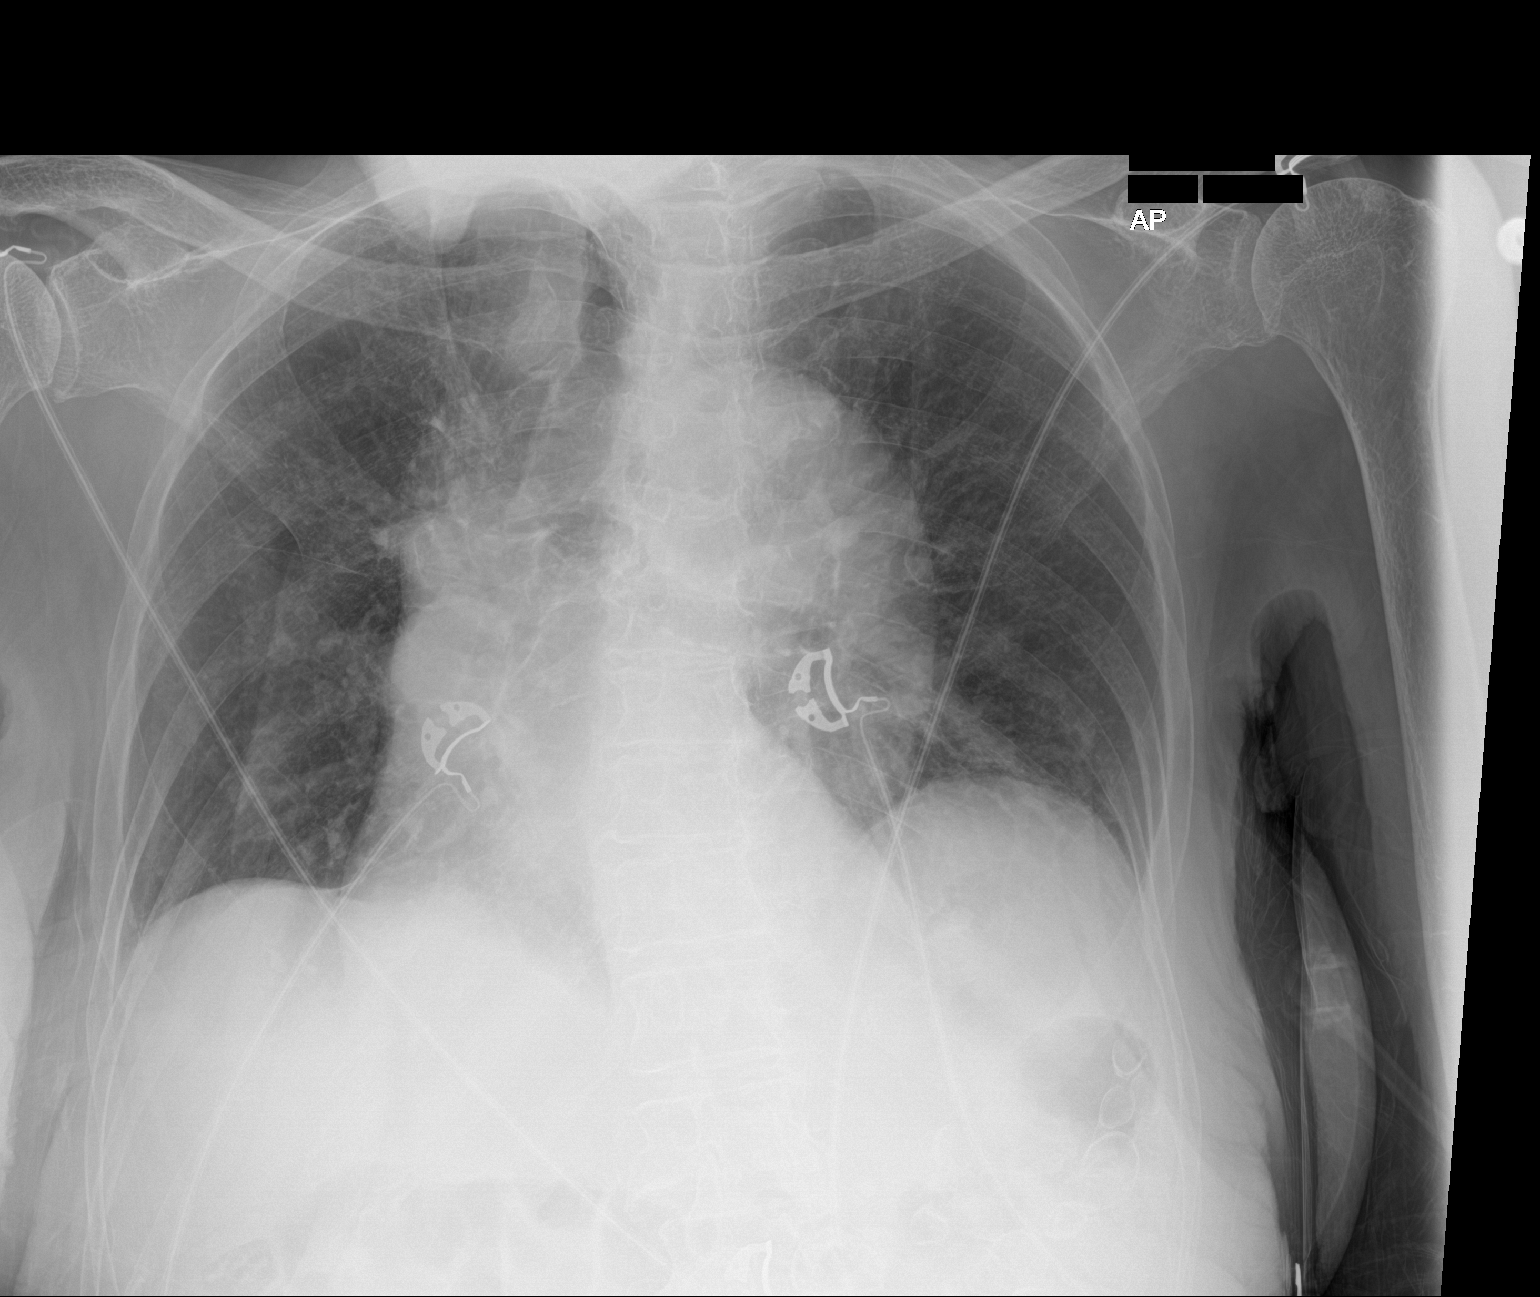

[1 of 1 positions shown; findings below may reference images not displayed]

FINDINGS: Cardiac shadow is stable. Tortuosity of the thoracic aorta is noted.
Patient is somewhat rotated accentuating the mediastinal markings.
No focal infiltrate or effusion is seen. No acute bony abnormality
is noted.
IMPRESSION: No acute abnormality seen.

## 2019-09-10 IMAGING — CR DG CHEST 2V
2 series · 2 of 2 positions shown · non-contrast
Comparison: 11/28/2018, 05/25/2018, 10/28/2017 and 02/16/2010.

CLINICAL DATA: Weakness and fever. Ex-smoker.

EXAM:
CHEST - 2 VIEW

[w chest lat]
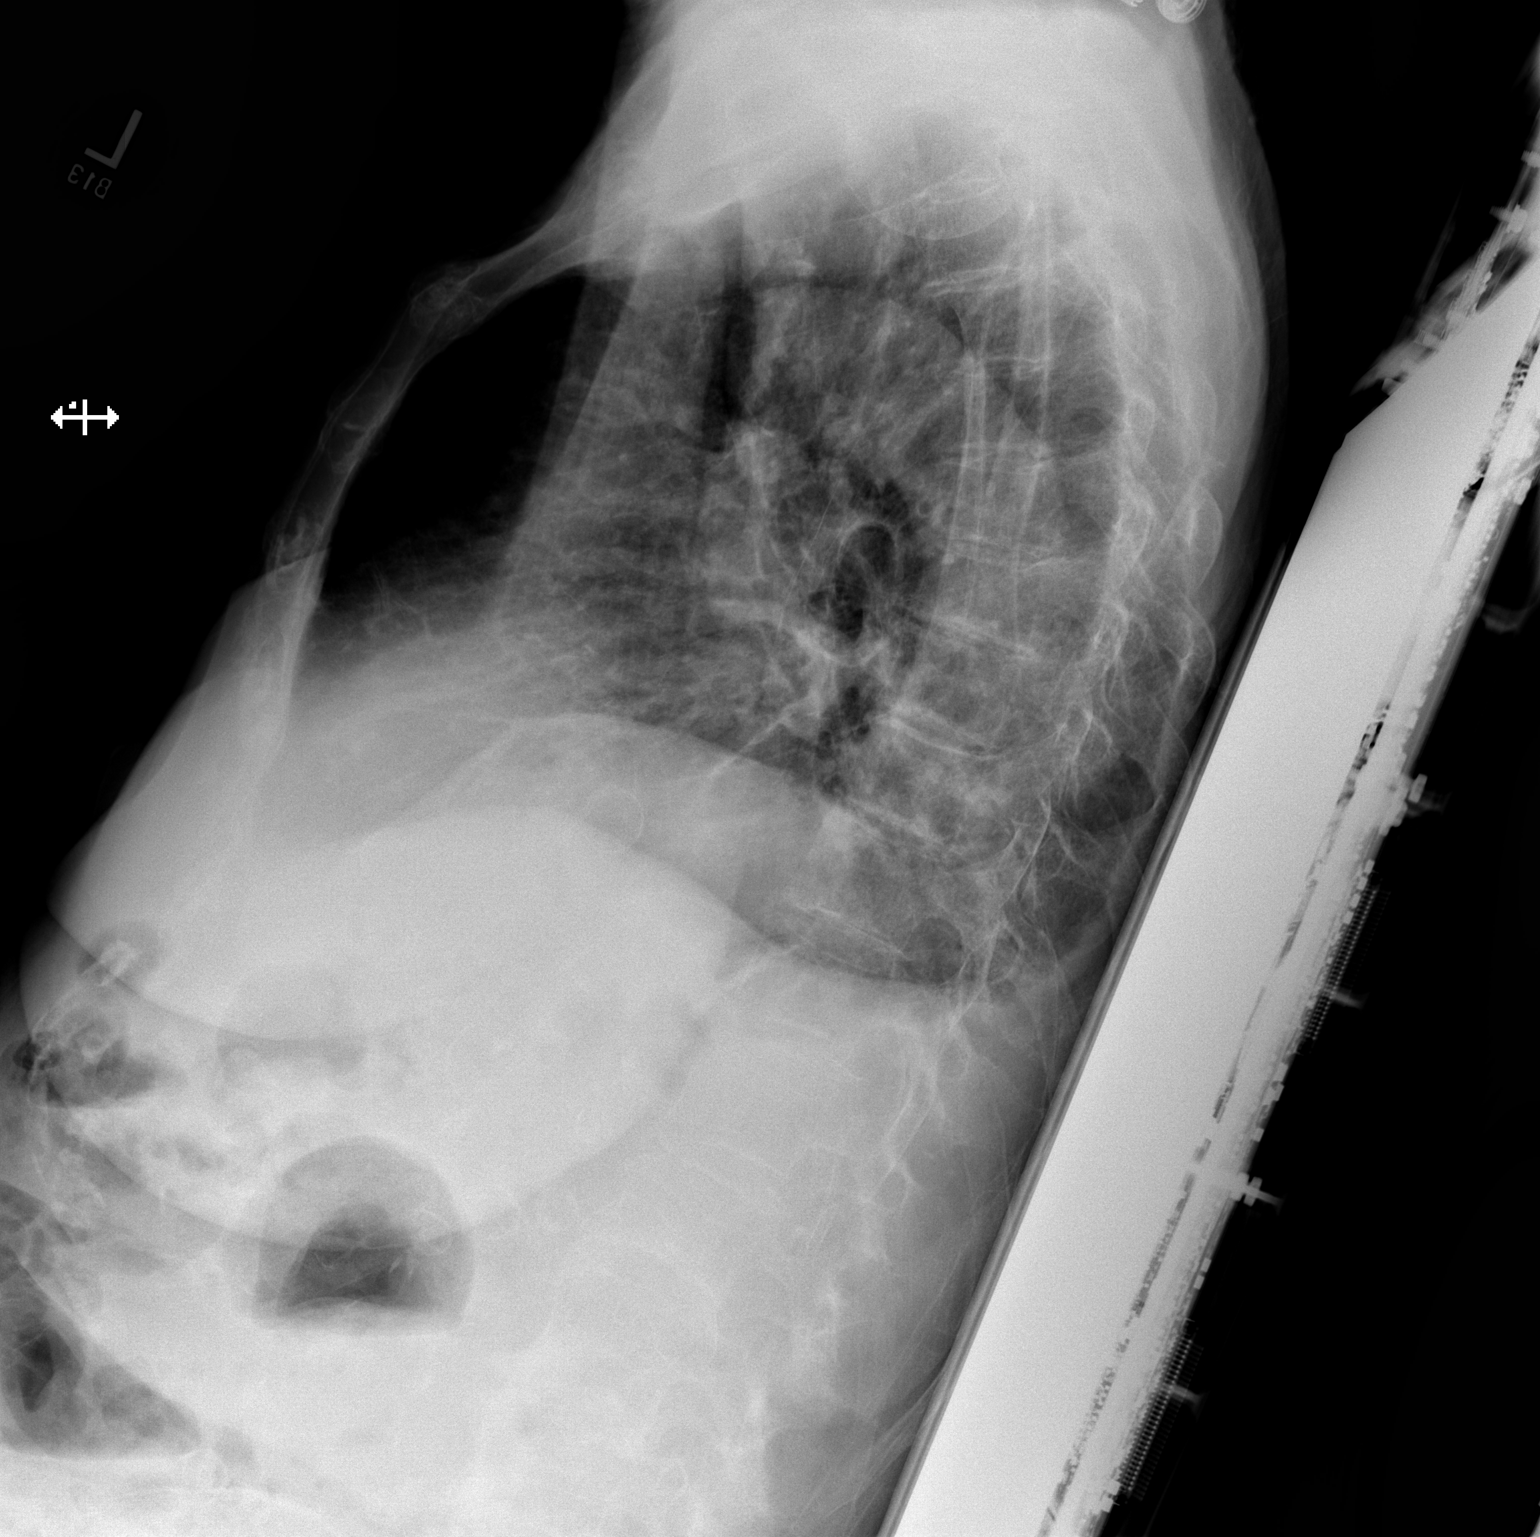

[x chest ap]
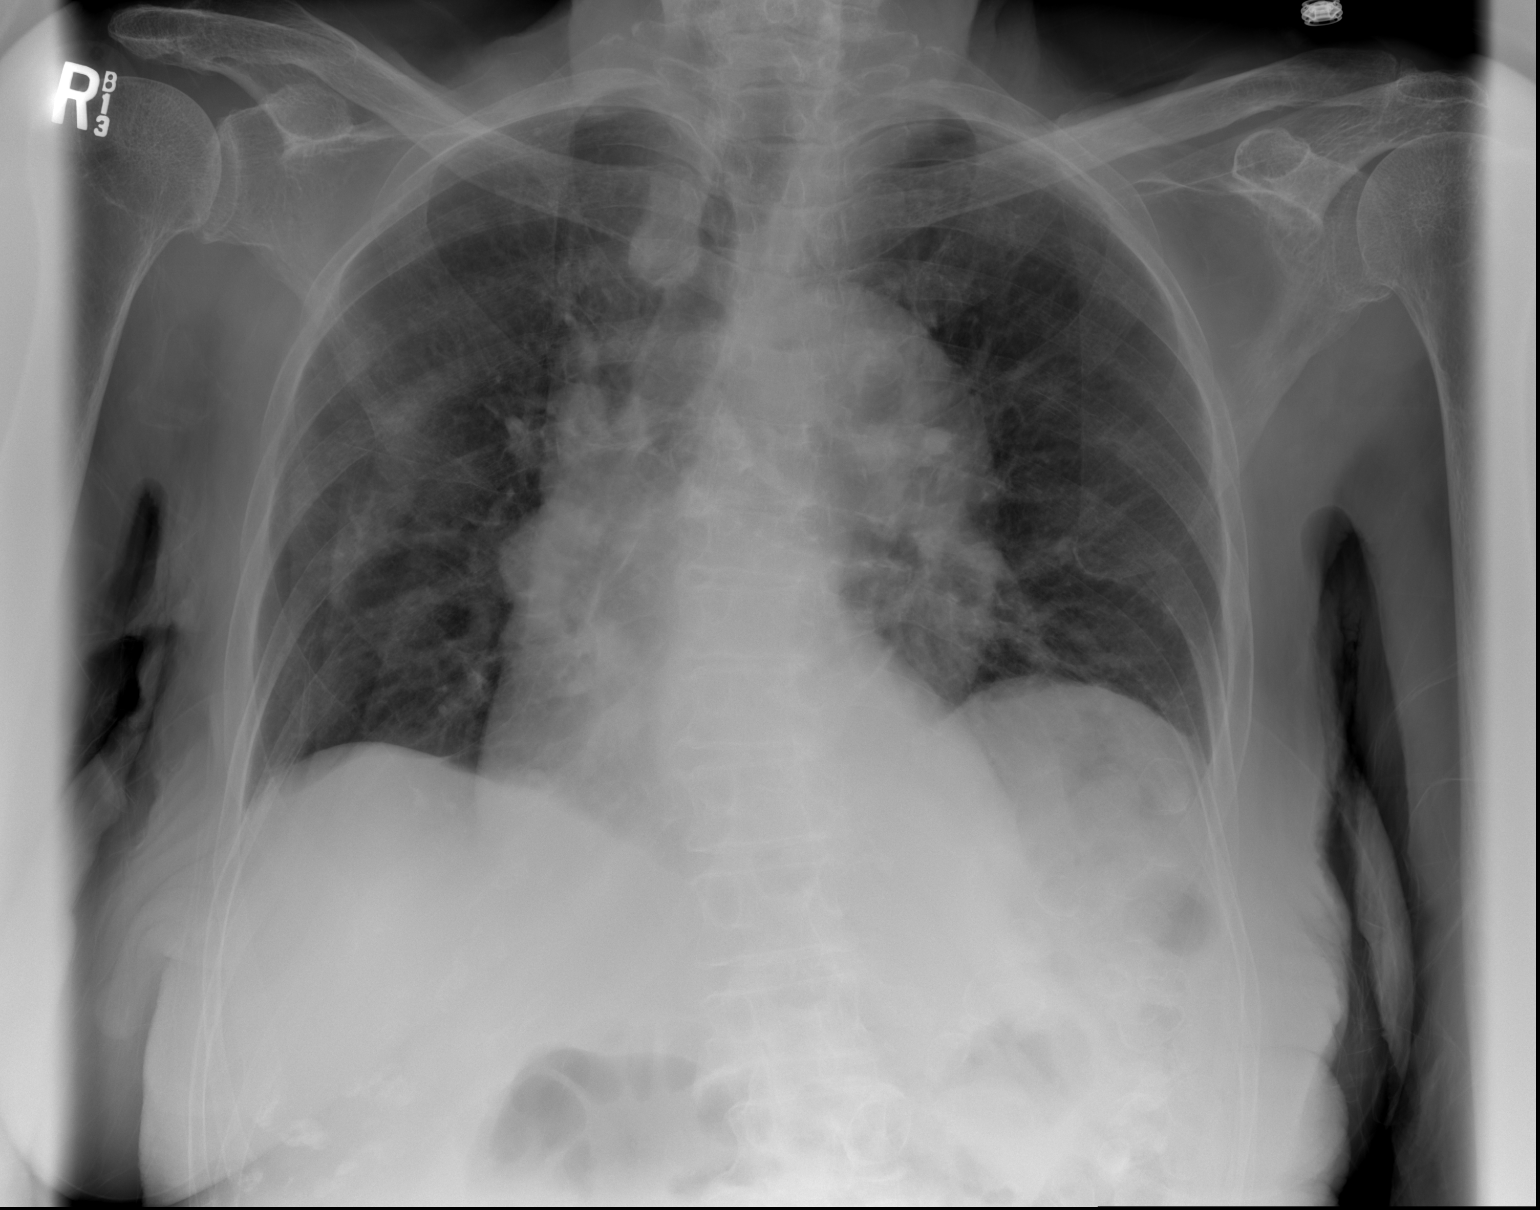

[2 of 2 positions shown; findings below may reference images not displayed]

FINDINGS: A poor inspiration is demonstrated with no gross enlargement of the
cardiac silhouette. The aorta remains tortuous. The patient remains
rotated to the right. A slightly nodular appearance of the right
hilum and right superior mediastinum is again demonstrated. A
similar appearance is demonstrated on 12/02/2017 when the patient is
also rotated to the right its appearance is not seen when the
patient is not rotated. Clear lungs. Stable mild elevation of the
left hemidiaphragm. Small posterior pleural effusion on the right.
Diffuse osteopenia and mild scoliosis.
IMPRESSION: 1. Small right pleural effusion.
2. Stable slightly nodular appearance of the right hilum and right
superior mediastinum. This is most likely vascular and projectional
due to the patient rotation.
